# Patient Record
Sex: Female | Born: 1955
Health system: Southern US, Community
[De-identification: ages and names within clinical notes are randomized; demographics above are authoritative.]

## PROBLEM LIST (undated history)

## (undated) DIAGNOSIS — C801 Malignant (primary) neoplasm, unspecified: Secondary | ICD-10-CM

## (undated) DIAGNOSIS — D649 Anemia, unspecified: Secondary | ICD-10-CM

## (undated) DIAGNOSIS — Z889 Allergy status to unspecified drugs, medicaments and biological substances status: Secondary | ICD-10-CM

## (undated) DIAGNOSIS — E119 Type 2 diabetes mellitus without complications: Secondary | ICD-10-CM

## (undated) DIAGNOSIS — D219 Benign neoplasm of connective and other soft tissue, unspecified: Secondary | ICD-10-CM

## (undated) DIAGNOSIS — M359 Systemic involvement of connective tissue, unspecified: Secondary | ICD-10-CM

## (undated) DIAGNOSIS — I10 Essential (primary) hypertension: Secondary | ICD-10-CM

## (undated) DIAGNOSIS — E079 Disorder of thyroid, unspecified: Secondary | ICD-10-CM

## (undated) DIAGNOSIS — L80 Vitiligo: Secondary | ICD-10-CM

## (undated) DIAGNOSIS — L309 Dermatitis, unspecified: Secondary | ICD-10-CM

## (undated) DIAGNOSIS — B009 Herpesviral infection, unspecified: Secondary | ICD-10-CM

## (undated) DIAGNOSIS — K219 Gastro-esophageal reflux disease without esophagitis: Secondary | ICD-10-CM

## (undated) HISTORY — DX: Herpesviral infection, unspecified: B00.9

## (undated) HISTORY — DX: Benign neoplasm of connective and other soft tissue, unspecified: D21.9

## (undated) HISTORY — DX: Dermatitis, unspecified: L30.9

## (undated) HISTORY — DX: Type 2 diabetes mellitus without complications: E11.9

## (undated) HISTORY — DX: Disorder of thyroid, unspecified: E07.9

## (undated) HISTORY — PX: ABDOMINAL HYSTERECTOMY: SHX81

## (undated) HISTORY — DX: Vitiligo: L80

---

## 2001-03-18 ENCOUNTER — Other Ambulatory Visit: Admission: RE | Admit: 2001-03-18 | Discharge: 2001-03-18 | Payer: Self-pay | Admitting: Obstetrics and Gynecology

## 2001-03-18 ENCOUNTER — Ambulatory Visit (HOSPITAL_COMMUNITY): Admission: RE | Admit: 2001-03-18 | Discharge: 2001-03-18 | Payer: Self-pay | Admitting: Pediatrics

## 2001-03-18 ENCOUNTER — Encounter: Payer: Self-pay | Admitting: General Surgery

## 2002-01-17 ENCOUNTER — Encounter: Payer: Self-pay | Admitting: Emergency Medicine

## 2002-01-17 ENCOUNTER — Emergency Department (HOSPITAL_COMMUNITY): Admission: EM | Admit: 2002-01-17 | Discharge: 2002-01-17 | Payer: Self-pay | Admitting: Emergency Medicine

## 2002-03-25 ENCOUNTER — Encounter: Payer: Self-pay | Admitting: General Surgery

## 2002-03-25 ENCOUNTER — Ambulatory Visit (HOSPITAL_COMMUNITY): Admission: RE | Admit: 2002-03-25 | Discharge: 2002-03-25 | Payer: Self-pay | Admitting: General Surgery

## 2002-07-07 ENCOUNTER — Encounter: Payer: Self-pay | Admitting: Family Medicine

## 2002-07-07 ENCOUNTER — Ambulatory Visit (HOSPITAL_COMMUNITY): Admission: RE | Admit: 2002-07-07 | Discharge: 2002-07-07 | Payer: Self-pay | Admitting: Family Medicine

## 2003-04-06 ENCOUNTER — Ambulatory Visit (HOSPITAL_COMMUNITY): Admission: RE | Admit: 2003-04-06 | Discharge: 2003-04-06 | Payer: Self-pay | Admitting: Family Medicine

## 2003-04-06 ENCOUNTER — Encounter: Payer: Self-pay | Admitting: Family Medicine

## 2004-04-28 ENCOUNTER — Ambulatory Visit (HOSPITAL_COMMUNITY): Admission: RE | Admit: 2004-04-28 | Discharge: 2004-04-28 | Payer: Self-pay | Admitting: Family Medicine

## 2004-08-13 ENCOUNTER — Emergency Department (HOSPITAL_COMMUNITY): Admission: EM | Admit: 2004-08-13 | Discharge: 2004-08-13 | Payer: Self-pay | Admitting: Emergency Medicine

## 2005-05-01 ENCOUNTER — Ambulatory Visit (HOSPITAL_COMMUNITY): Admission: RE | Admit: 2005-05-01 | Discharge: 2005-05-01 | Payer: Self-pay | Admitting: Family Medicine

## 2006-05-03 ENCOUNTER — Ambulatory Visit (HOSPITAL_COMMUNITY): Admission: RE | Admit: 2006-05-03 | Discharge: 2006-05-03 | Payer: Self-pay | Admitting: Family Medicine

## 2006-12-16 ENCOUNTER — Ambulatory Visit (HOSPITAL_COMMUNITY): Admission: RE | Admit: 2006-12-16 | Discharge: 2006-12-16 | Payer: Self-pay | Admitting: Family Medicine

## 2007-02-03 ENCOUNTER — Ambulatory Visit: Payer: Self-pay | Admitting: Orthopedic Surgery

## 2007-03-04 ENCOUNTER — Ambulatory Visit: Payer: Self-pay | Admitting: Orthopedic Surgery

## 2007-05-05 ENCOUNTER — Ambulatory Visit (HOSPITAL_COMMUNITY): Admission: RE | Admit: 2007-05-05 | Discharge: 2007-05-05 | Payer: Self-pay | Admitting: Family Medicine

## 2007-06-20 ENCOUNTER — Encounter: Payer: Self-pay | Admitting: Orthopedic Surgery

## 2007-06-23 ENCOUNTER — Ambulatory Visit: Payer: Self-pay | Admitting: Orthopedic Surgery

## 2007-06-23 DIAGNOSIS — M25569 Pain in unspecified knee: Secondary | ICD-10-CM

## 2007-06-23 DIAGNOSIS — M23302 Other meniscus derangements, unspecified lateral meniscus, unspecified knee: Secondary | ICD-10-CM

## 2007-06-23 DIAGNOSIS — M1710 Unilateral primary osteoarthritis, unspecified knee: Secondary | ICD-10-CM

## 2008-05-05 ENCOUNTER — Ambulatory Visit (HOSPITAL_COMMUNITY): Admission: RE | Admit: 2008-05-05 | Discharge: 2008-05-05 | Payer: Self-pay | Admitting: Family Medicine

## 2009-05-09 ENCOUNTER — Ambulatory Visit (HOSPITAL_COMMUNITY): Admission: RE | Admit: 2009-05-09 | Discharge: 2009-05-09 | Payer: Self-pay | Admitting: Family Medicine

## 2010-05-11 ENCOUNTER — Ambulatory Visit (HOSPITAL_COMMUNITY): Admission: RE | Admit: 2010-05-11 | Discharge: 2010-05-11 | Payer: Self-pay | Admitting: Family Medicine

## 2010-12-01 NOTE — Op Note (Signed)
Sophia Wilson, Sophia Wilson              ACCOUNT NO.:  [id number]   MEDICAL RECORD NO.:  [id number]          PATIENT TYPE:  EMS   LOCATION:  ED                            FACILITY:  [facility]   PHYSICIAN:  Carren Rang, M.D.    DATE OF BIRTH:  1955/09/13   DATE OF PROCEDURE:  08/14/2003  DATE OF DISCHARGE:  08/13/2004                                 OPERATIVE REPORT   OPERATION/PROCEDURE:  Right thumb nail bed repair.   INDICATIONS:  Acute open fracture with nail bed laceration, right thumb,  sustained when thumb was cut by dirty fan at work.  X-ray shows distal tuft  fracture.   DESCRIPTION OF PROCEDURE:  After Betadine, alcohol prep, digital block was  achieved with a 0.5% Marcaine without epinephrine injection.  Good  anesthesia was achieved.  The wound was again cleaned with Betadine and nail  plate was removed in two major fragments with a Therapist, nutritional. The nail  plate was then debrided and soaked in Betadine and the nail bed was cleaned  copiously with Betadine and saline irrigation.  A small, approximately 3 x 3  mm flap laceration of the distal nail bed was noted and found to be ischemic  and dirty in appearance and was debrided away.  The wound extended under the  tip of the distal tuft and was irrigated copiously with normal saline.  No  persistent foreign bodies were identified.  The nail plates were then again  cleaned well and debrided.  Because of the fracture across the nail plate at  the nail bed laceration, the nail plate was rotated 180 degrees and the  distal portion of the nail plate was placed under the eponychium and the  proximal portion of the nail plates over the tip of the nail bed.  The nail  plates were pierced with an 18-gauge needle so that suture could be placed.  The nail plates were sutured in placed over the nail bed providing a  template for healing of the nail bed and to provide separation from  eponychium and the nail bed.   DIAGNOSIS:  Acute open  fracture of the right thumb distal phalanx with no  bed laceration and nail plate repair.   PLAN:  The patient was given prescription for Keflex 500 mg for five days  and Vicodin one to two q.4-6h. p.r.n. pain not relieved by Aleve.  She was  also given DT in the ED.  She is to follow up with Dr. Hilda Lias in two days.  Contact has already been made with Dr. Hilda Lias.      VC/MEDQ  D:  08/13/2004  T:  08/14/2004  Job:  [id number]

## 2011-04-02 ENCOUNTER — Other Ambulatory Visit (HOSPITAL_COMMUNITY): Payer: Self-pay | Admitting: Family Medicine

## 2011-04-02 DIAGNOSIS — Z1239 Encounter for other screening for malignant neoplasm of breast: Secondary | ICD-10-CM

## 2011-05-07 ENCOUNTER — Ambulatory Visit (HOSPITAL_COMMUNITY): Payer: Self-pay

## 2011-05-14 ENCOUNTER — Ambulatory Visit (HOSPITAL_COMMUNITY)
Admission: RE | Admit: 2011-05-14 | Discharge: 2011-05-14 | Disposition: A | Discharge status: Home / Self Care | Payer: 59 | Source: Ambulatory Visit | Attending: Family Medicine | Admitting: Family Medicine

## 2011-05-14 DIAGNOSIS — Z1239 Encounter for other screening for malignant neoplasm of breast: Secondary | ICD-10-CM

## 2011-05-14 DIAGNOSIS — Z1231 Encounter for screening mammogram for malignant neoplasm of breast: Secondary | ICD-10-CM | POA: Insufficient documentation

## 2011-05-19 ENCOUNTER — Emergency Department (HOSPITAL_COMMUNITY)
Admission: EM | Admit: 2011-05-19 | Discharge: 2011-05-19 | Disposition: A | Discharge status: Home / Self Care | Payer: 59 | Attending: Emergency Medicine | Admitting: Emergency Medicine

## 2011-05-19 ENCOUNTER — Emergency Department (HOSPITAL_COMMUNITY): Payer: 59

## 2011-05-19 DIAGNOSIS — J069 Acute upper respiratory infection, unspecified: Secondary | ICD-10-CM | POA: Insufficient documentation

## 2011-05-19 HISTORY — DX: Essential (primary) hypertension: I10

## 2011-05-19 NOTE — ED Notes (Signed)
Pt lying in bed resting

## 2011-05-19 NOTE — ED Notes (Signed)
Pt presents with sore throat, headache, and nausea since Wednesday. NAD at this time.

## 2011-05-19 NOTE — ED Provider Notes (Signed)
History     CSN: [id number] Arrival date & time: 05/19/2011  5:31 PM   Chief Complaint  Patient presents with  . Headache  . Sore Throat  . Nausea    HPI Pt was seen at 1835.  Per pt, c/o gradual onset and persistence of constant runny/stuffy nose, cough, sore throat, sinus and ears congestion x3 days.  +multiple others in household with same symptoms.  Denies CP/palpitations, no SOB, no abd pain, no fevers, no N/V/D, no rash.     Past Medical History  Diagnosis Date  . Hypertension     Past Surgical History  Procedure Date  . Abdominal hysterectomy     Social History  . Marital Status: Married   Social History Main Topics  . Smoking status: Never Smoker   . Smokeless tobacco: None  . Alcohol Use: No  . Drug Use: No    Review of Systems ROS: Statement: All systems negative except as marked or noted in the HPI; Constitutional: Negative for fever and chills. ; ; Eyes: Negative for eye pain, redness and discharge. ; ; ENMT: Positive for ear pain, hoarseness, nasal congestion, sinus pressure and sore throat. ; ; Cardiovascular: Negative for chest pain, palpitations, diaphoresis, dyspnea and peripheral edema. ; ; Respiratory: +cough.  Negative for wheezing and stridor. ; ; Gastrointestinal: Negative for nausea, vomiting, diarrhea and abdominal pain, blood in stool, hematemesis, jaundice and rectal bleeding. . ; ; Genitourinary: Negative for dysuria, flank pain and hematuria. ; ; Musculoskeletal: Negative for back pain and neck pain. Negative for swelling and trauma.; ; Skin: Negative for pruritus, rash, abrasions, blisters, bruising and skin lesion.; ; Neuro: Negative for headache, lightheadedness and neck stiffness. Negative for weakness, altered level of consciousness , altered mental status, extremity weakness, paresthesias, involuntary movement, seizure and syncope.    Allergies  Sulfur  Home Medications   Current Outpatient Rx  Name Route Sig Dispense Refill  . MELOXICAM  15 MG PO TABS Oral Take 15 mg by mouth daily.      Marland Kitchen OLMESARTAN-AMLODIPINE-HCTZ 40-5-25 MG PO TABS Oral Take 1 tablet by mouth daily.        BP 125/62  Pulse 84  Temp(Src) 100.1 F (37.8 C) (Oral)  Resp 18  SpO2 100%  Physical Exam 1840: Physical examination:  Nursing notes reviewed; Vital signs and O2 SAT reviewed;  Constitutional: Well developed, Well nourished, Well hydrated, In no acute distress; Head:  Normocephalic, atraumatic; Eyes: EOMI, PERRL, No scleral icterus; ENMT: TM's clear bilat, +edemetous nasal turbinates bilat with clear rhinorrhea.  Mouth and pharynx normal, Mucous membranes moist; Neck: Supple, Full range of motion, No lymphadenopathy; Cardiovascular: Regular rate and rhythm, No murmur, rub, or gallop; Respiratory: Breath sounds clear & equal bilaterally, No rales, rhonchi, wheezes, or rub, Normal respiratory effort/excursion; Chest: Nontender, Movement normal; Abdomen: Soft, Nontender, Nondistended, Normal bowel sounds; Extremities: Pulses normal, No tenderness, No edema, No calf edema or asymmetry.; Neuro: AA&Ox3, Major CN grossly intact.  No gross focal motor or sensory deficits in extremities.; Skin: Color normal, Warm, Dry, no rash.   ED Course  Procedures  MDM  MDM Reviewed: nursing note and vitals Interpretation: labs and x-ray   Results for orders placed during the hospital encounter of 05/19/11  RAPID STREP SCREEN      Component Value Range   Streptococcus, Group A Screen (Direct) NEGATIVE  NEGATIVE    Dg Chest 2 View  05/19/2011  *RADIOLOGY REPORT*  Clinical Data: Cough.  Short of breath.  Congestion.  CHEST - 2 VIEW  Comparison: None  Findings: Heart size is normal.  Mediastinal shadows are normal. The lungs are clear.  No effusions.  Ordinary mild degenerative changes effect the spine.  IMPRESSION: No active disease  Original Report Authenticated By: Thomasenia Sales, M.D.   8:20 PM:  Appears URI, will tx symptomatically for now.  Pt wants to go home  now.  Dx testing d/w pt.  Questions answered.  Verb understanding, agreeable to d/c home with outpt f/u.      Kalimah Capurro Allison Quarry, DO 05/21/11 1247

## 2011-05-19 NOTE — ED Notes (Signed)
Pt taken to xray 

## 2012-02-13 ENCOUNTER — Other Ambulatory Visit: Payer: Self-pay

## 2012-02-13 ENCOUNTER — Telehealth: Payer: Self-pay

## 2012-02-13 DIAGNOSIS — Z139 Encounter for screening, unspecified: Secondary | ICD-10-CM

## 2012-02-13 NOTE — Telephone Encounter (Signed)
Gastroenterology Pre-Procedure Form   Request Date: 02/13/2012      Requesting Physician: Dr. Phillips Odor     PATIENT INFORMATION:  Sophia Wilson is a 56 y.o., female (DOB=30-Sep-1955).  PROCEDURE: Procedure(s) requested: colonoscopy Procedure Reason: screening for colon cancer  PATIENT REVIEW QUESTIONS: The patient reports the following:   1. Diabetes Melitis: no 2. Joint replacements in the past 12 months: no 3. Major health problems in the past 3 months: no 4. Has an artificial valve or MVP:no 5. Has been advised in past to take antibiotics in advance of a procedure like teeth cleaning: no}    MEDICATIONS & ALLERGIES:    Patient reports the following regarding taking any blood thinners:   Plavix? no Aspirin?no Coumadin?  no  Patient confirms/reports the following medications:  Current Outpatient Prescriptions  Medication Sig Dispense Refill  . fluticasone (VERAMYST) 27.5 MCG/SPRAY nasal spray Place 2 sprays into the nose daily. As needed      . loratadine (CLARITIN) 10 MG tablet Take 10 mg by mouth daily. As needed      . meloxicam (MOBIC) 15 MG tablet Take 15 mg by mouth daily.        . Olmesartan-Amlodipine-HCTZ (TRIBENZOR) 40-5-25 MG TABS Take 1 tablet by mouth daily.          Patient confirms/reports the following allergies:  Allergies  Allergen Reactions  . Sulfur Hives    Patient is appropriate to schedule for requested procedure(s): yes  AUTHORIZATION INFORMATION Primary Insurance:   ID #:  Group #:  Pre-Cert / Auth required:    Secondary Insurance:   ID #:   Group #:  Pre-Cert / Auth required: Pre-Cert / Auth #:   No orders of the defined types were placed in this encounter.    SCHEDULE INFORMATION: Procedure has been scheduled as follows:  Date: 03/10/2012    Time: 10:30 AM  Location: [facility] Short Stay  This Gastroenterology Pre-Precedure Form is being routed to the following provider(s) for review: Jonette Eva, MD

## 2012-02-13 NOTE — Telephone Encounter (Signed)
MOVI PREP SPLIT DOSING, REGULAR BREAKFAST. CLEAR LIQUIDS AFTER 9 AM.  

## 2012-02-13 NOTE — Telephone Encounter (Signed)
Rx and instructions mailed to pt.  

## 2012-02-13 NOTE — Telephone Encounter (Signed)
Called pt VM not set up yet.

## 2012-02-25 ENCOUNTER — Encounter (HOSPITAL_COMMUNITY): Payer: Self-pay | Admitting: Pharmacy Technician

## 2012-03-10 ENCOUNTER — Ambulatory Visit (HOSPITAL_COMMUNITY)
Admission: RE | Admit: 2012-03-10 | Discharge: 2012-03-10 | Disposition: A | Discharge status: Home / Self Care | Payer: 59 | Source: Ambulatory Visit | Attending: Gastroenterology | Admitting: Gastroenterology

## 2012-03-10 ENCOUNTER — Encounter (HOSPITAL_COMMUNITY)
Admission: RE | Disposition: A | Discharge status: Home / Self Care | Payer: Self-pay | Source: Ambulatory Visit | Attending: Gastroenterology

## 2012-03-10 ENCOUNTER — Encounter (HOSPITAL_COMMUNITY): Payer: Self-pay | Admitting: *Deleted

## 2012-03-10 DIAGNOSIS — K644 Residual hemorrhoidal skin tags: Secondary | ICD-10-CM | POA: Insufficient documentation

## 2012-03-10 DIAGNOSIS — I10 Essential (primary) hypertension: Secondary | ICD-10-CM | POA: Insufficient documentation

## 2012-03-10 DIAGNOSIS — Z1211 Encounter for screening for malignant neoplasm of colon: Secondary | ICD-10-CM | POA: Insufficient documentation

## 2012-03-10 DIAGNOSIS — Z139 Encounter for screening, unspecified: Secondary | ICD-10-CM

## 2012-03-10 HISTORY — PX: COLONOSCOPY: SHX5424

## 2012-03-10 HISTORY — DX: Anemia, unspecified: D64.9

## 2012-03-10 HISTORY — DX: Allergy status to unspecified drugs, medicaments and biological substances: Z88.9

## 2012-03-10 SURGERY — COLONOSCOPY
Anesthesia: Moderate Sedation

## 2012-03-10 MED ORDER — MIDAZOLAM HCL 5 MG/5ML IJ SOLN
INTRAMUSCULAR | Status: DC | PRN
  Administered 2012-03-10 (×3): 2 mg via INTRAVENOUS

## 2012-03-10 MED ORDER — SODIUM CHLORIDE 0.45 % IV SOLN
Freq: Once | INTRAVENOUS | Status: AC
  Administered 2012-03-10: 10:00:00 via INTRAVENOUS

## 2012-03-10 MED ORDER — STERILE WATER FOR IRRIGATION IR SOLN
Status: DC | PRN
  Administered 2012-03-10: 10:00:00

## 2012-03-10 MED ORDER — MEPERIDINE HCL 100 MG/ML IJ SOLN
INTRAMUSCULAR | Status: AC
  Filled 2012-03-10: qty 2

## 2012-03-10 MED ORDER — MEPERIDINE HCL 100 MG/ML IJ SOLN
INTRAMUSCULAR | Status: DC | PRN
  Administered 2012-03-10 (×3): 25 mg via INTRAVENOUS

## 2012-03-10 MED ORDER — MIDAZOLAM HCL 5 MG/5ML IJ SOLN
INTRAMUSCULAR | Status: AC
  Filled 2012-03-10: qty 10

## 2012-03-10 NOTE — Op Note (Signed)
[facility] [address] [place], [address]   COLONOSCOPY PROCEDURE REPORT  PATIENT: Sophia Wilson, Sophia Wilson  MR#: [id number] BIRTHDATE: April 08, 1956 , 56  yrs. old GENDER: Female ENDOSCOPIST: Jonette Eva, MD REFERRED YQ:[id number] Phillips Odor, M.D. PROCEDURE DATE:  03/10/2012 PROCEDURE:   Colonoscopy, screening INDICATIONS:SCREENING. MEDICATIONS: Demerol 75 and Versed 6  DESCRIPTION OF PROCEDURE:    Physical exam was performed.  Informed consent was obtained from the patient after explaining the benefits, risks, and alternatives to procedure.  The patient was connected to monitor and placed in left lateral position. Continuous oxygen was provided by nasal cannula and IV medicine administered through an indwelling cannula.  After administration of sedation and rectal exam, the patients rectum was intubated and the EC-3890Li ([id number])  colonoscope was advanced under direct visualization to the cecum.  The scope was removed slowly by carefully examining the color, texture, anatomy, and integrity mucosa on the way out.  The patient was recovered in endoscopy and discharged home in satisfactory condition.       COLON FINDINGS: A normal appearing cecum, ileocecal valve, and appendiceal orifice were identified.  The ascending, hepatic flexure, transverse, splenic flexure, descending, sigmoid colon and rectum appeared unremarkable.  No polyps, DIVERTICULOSIS, or cancers were seen and Moderate sized internal hemorrhoids were found.  PREP QUALITY: excellent. CECAL W/D TIME: 11 minutes  COMPLICATIONS: None  ENDOSCOPIC IMPRESSION: 1.   Normal colon 2.   Moderate sized internal hemorrhoids   RECOMMENDATIONS: 1.  High fiber diet 2.  repeat Colonscopy in 10 years.    _______________________________ Rosalie DoctorJonette Eva, MD 03/10/2012 10:56 AM

## 2012-03-10 NOTE — H&P (Signed)
  Primary Care Physician:  Colette Ribas, MD Primary Gastroenterologist:  Dr. Darrick Penna  Pre-Procedure History & Physical: HPI:  Sophia Wilson is a 56 y.o. female here for COLON CANCER SCREENING.   Past Medical History  Diagnosis Date  . Hypertension   . H/O seasonal allergies   . Anemia     Past Surgical History  Procedure Date  . Abdominal hysterectomy     Prior to Admission medications   Medication Sig Start Date End Date Taking? Authorizing Provider  loratadine (CLARITIN) 10 MG tablet Take 10 mg by mouth daily as needed. For allergies   Yes Historical Provider, MD  meloxicam (MOBIC) 15 MG tablet Take 15 mg by mouth daily.     Yes Historical Provider, MD  Olmesartan-Amlodipine-HCTZ (TRIBENZOR) 40-5-25 MG TABS Take 1 tablet by mouth daily.     Yes Historical Provider, MD  fluticasone (FLONASE) 50 MCG/ACT nasal spray Place 2 sprays into the nose daily as needed. For allergies    Historical Provider, MD    Allergies as of 02/13/2012 - Review Complete 02/13/2012  Allergen Reaction Noted  . Sulfur Hives     Family History  Problem Relation Age of Onset  . Colon cancer Neg Hx     History   Social History  . Marital Status: Married    Spouse Name: N/A    Number of Children: N/A  . Years of Education: N/A   Occupational History  . Not on file.   Social History Main Topics  . Smoking status: Never Smoker   . Smokeless tobacco: Not on file  . Alcohol Use: No  . Drug Use: No  . Sexually Active:    Other Topics Concern  . Not on file   Social History Narrative  . No narrative on file    Review of Systems: See HPI, otherwise negative ROS   Physical Exam: BP 154/89  Pulse 76  Temp 98.5 F (36.9 C) (Oral)  Resp 21  Ht 5' 7.5" (1.715 m)  Wt 234 lb (106.142 kg)  BMI 36.11 kg/m2  SpO2 100% General:   Alert,  pleasant and cooperative in NAD Head:  Normocephalic and atraumatic. Neck:  Supple;  Lungs:  Clear throughout to auscultation.    Heart:   Regular rate and rhythm. Abdomen:  Soft, nontender and nondistended. Normal bowel sounds, without guarding, and without rebound.   Neurologic:  Alert and  oriented x4;  grossly normal neurologically.  Impression/Plan:     SCREENING  Plan:  1. TCS TODAY

## 2012-03-13 ENCOUNTER — Encounter (HOSPITAL_COMMUNITY): Payer: Self-pay | Admitting: Gastroenterology

## 2012-04-14 ENCOUNTER — Other Ambulatory Visit (HOSPITAL_COMMUNITY): Payer: Self-pay | Admitting: Family Medicine

## 2012-04-14 DIAGNOSIS — Z139 Encounter for screening, unspecified: Secondary | ICD-10-CM

## 2012-05-15 ENCOUNTER — Ambulatory Visit (HOSPITAL_COMMUNITY)
Admission: RE | Admit: 2012-05-15 | Discharge: 2012-05-15 | Disposition: A | Discharge status: Home / Self Care | Payer: 59 | Source: Ambulatory Visit | Attending: Family Medicine | Admitting: Family Medicine

## 2012-05-15 DIAGNOSIS — Z139 Encounter for screening, unspecified: Secondary | ICD-10-CM

## 2012-05-15 DIAGNOSIS — Z1231 Encounter for screening mammogram for malignant neoplasm of breast: Secondary | ICD-10-CM | POA: Insufficient documentation

## 2012-06-19 ENCOUNTER — Other Ambulatory Visit (HOSPITAL_COMMUNITY): Payer: Self-pay | Admitting: Family Medicine

## 2012-06-19 DIAGNOSIS — R220 Localized swelling, mass and lump, head: Secondary | ICD-10-CM

## 2012-06-20 ENCOUNTER — Ambulatory Visit (HOSPITAL_COMMUNITY)
Admission: RE | Admit: 2012-06-20 | Discharge: 2012-06-20 | Disposition: A | Discharge status: Home / Self Care | Payer: 59 | Source: Ambulatory Visit | Attending: Family Medicine | Admitting: Family Medicine

## 2012-06-20 DIAGNOSIS — R220 Localized swelling, mass and lump, head: Secondary | ICD-10-CM | POA: Insufficient documentation

## 2012-06-20 DIAGNOSIS — R221 Localized swelling, mass and lump, neck: Secondary | ICD-10-CM

## 2012-07-30 ENCOUNTER — Other Ambulatory Visit (HOSPITAL_COMMUNITY): Payer: Self-pay | Admitting: "Endocrinology

## 2012-07-30 DIAGNOSIS — E042 Nontoxic multinodular goiter: Secondary | ICD-10-CM

## 2012-08-01 DIAGNOSIS — E042 Nontoxic multinodular goiter: Secondary | ICD-10-CM | POA: Insufficient documentation

## 2012-08-05 ENCOUNTER — Other Ambulatory Visit (HOSPITAL_COMMUNITY): Payer: Self-pay | Admitting: "Endocrinology

## 2012-08-05 ENCOUNTER — Ambulatory Visit (HOSPITAL_COMMUNITY)
Admission: RE | Admit: 2012-08-05 | Discharge: 2012-08-05 | Disposition: A | Discharge status: Home / Self Care | Payer: 59 | Source: Ambulatory Visit | Attending: "Endocrinology | Admitting: "Endocrinology

## 2012-08-05 ENCOUNTER — Other Ambulatory Visit (HOSPITAL_COMMUNITY): Payer: 59

## 2012-08-05 DIAGNOSIS — E042 Nontoxic multinodular goiter: Secondary | ICD-10-CM

## 2012-08-05 DIAGNOSIS — C73 Malignant neoplasm of thyroid gland: Secondary | ICD-10-CM | POA: Insufficient documentation

## 2012-08-14 ENCOUNTER — Encounter (HOSPITAL_COMMUNITY): Payer: Self-pay | Admitting: Pharmacy Technician

## 2012-08-14 NOTE — H&P (Signed)
  NTS SOAP Note  Vital Signs:  Vitals as of: 08/14/2012: Systolic 163: Diastolic 94: Heart Rate 71: Temp 98.84F: Height 17ft 7in: Weight 234Lbs 0 Ounces: BMI 37  BMI : 36.65 kg/m2  Subjective: This 56 Years 51 Months old Female presents for thyroid cancer.  Found on biopsy of a thyroid isthmus nodule.  Denies any voice changes.  Did feel a lump on her wind pipe.  No breathing issues.  No heat intolerance, heart palpitations, weight changes.  No family h/o thyroid cancer.  No h/o irradiation to the neck.  Review of Symptoms:  Constitutional:unremarkable   Head:unremarkable    Eyes:unremarkable   sinus Cardiovascular:  unremarkable   Respiratory:unremarkable   Gastrointestinal:  unremarkable   Genitourinary:unremarkable       joint and back pain Skin:unremarkable Hematolgic/Lymphatic:unremarkable     Allergic/Immunologic:unremarkable     Past Medical History:    Reviewed   Past Medical History  Surgical History: hysterectomy Medical Problems: HTN Allergies: sulfur Medications: tribenxor, leoxicam, fluticasone, loratadine   Social History:Reviewed  Social History  Preferred Language: English Race:  Black or African American Ethnicity: Not Hispanic / Latino Age: 33 Years 0 Months Marital Status:  M Alcohol:  No Recreational drug(s):  No   Smoking Status: Never smoker reviewed on 08/14/2012 Functional Status reviewed on mm/dd/yyyy ------------------------------------------------ Bathing: Normal Cooking: Normal Dressing: Normal Driving: Normal Eating: Normal Managing Meds: Normal Oral Care: Normal Shopping: Normal Toileting: Normal Transferring: Normal Walking: Normal Cognitive Status reviewed on mm/dd/yyyy ------------------------------------------------ Attention: Normal Decision Making: Normal Language: Normal Memory: Normal Motor: Normal Perception: Normal Problem Solving: Normal Visual and Spatial:  Normal   Family History:  Reviewed   Family History              Mother:  Cancer-lung    Objective Information: General:  Well appearing, well nourished in no distress. Eyes:unremarkable   Throat:  no erythema, exudates or lesions.   Palpable nodule in isthmus.  No lymphadenopathy.  Normal size gland. Heart:  RRR, no murmur Lungs:    CTA bilaterally, no wheezes, rhonchi, rales.  Breathing unlabored.  Assessment:Thyroid carcinoma  Diagnosis &amp; Procedure:    Plan:Scheduled for total thyroidectomy on 08/20/12.   Patient Education:Alternative treatments to surgery were discussed with patient (and family).  Risks and benefits  of procedure including bleeding, infection, and voice changes were fully explained to the patient (and family) who gave informed consent. Patient/family questions were addressed.  Follow-up:Pending Surgery

## 2012-08-14 NOTE — Patient Instructions (Addendum)
Sophia Wilson  08/14/2012   Your procedure is scheduled on:  08/20/12  Report to [facility] [place] [facility].  Call this number if you have problems the morning of surgery: [phone]   Remember:   Do not eat food or drink liquids after midnight.   Take these medicines the morning of surgery with A SIP OF WATER: tribenzor, flonase, clairtin   Do not wear jewelry, make-up or nail polish.  Do not wear lotions, powders, or perfumes. You may wear deodorant.  Do not shave 48 hours prior to surgery. Men may shave face and neck.  Do not bring valuables to the hospital.  Contacts, dentures or bridgework may not be worn into surgery.  Leave suitcase in the car. After surgery it may be brought to your room.  For patients admitted to the hospital, checkout time is 11:00 AM the day of  discharge.   Patients discharged the day of surgery will not be allowed to drive  home.  Name and phone number of your driver: family  Special Instructions: Shower using CHG 2 nights before surgery and the night before surgery.  If you shower the day of surgery use CHG.  Use special wash - you have one bottle of CHG for all showers.  You should use approximately 1/3 of the bottle for each shower.   Please read over the following fact sheets that you were given: Pain Booklet, Surgical Site Infection Prevention, Anesthesia Post-op Instructions and Care and Recovery After Surgery   PATIENT INSTRUCTIONS POST-ANESTHESIA  IMMEDIATELY FOLLOWING SURGERY:  Do not drive or operate machinery for the first twenty four hours after surgery.  Do not make any important decisions for twenty four hours after surgery or while taking narcotic pain medications or sedatives.  If you develop intractable nausea and vomiting or a severe headache please notify your doctor immediately.  FOLLOW-UP:  Please make an appointment with your surgeon as instructed. You do not need to follow up with anesthesia unless specifically instructed to do  so.  WOUND CARE INSTRUCTIONS (if applicable):  Keep a dry clean dressing on the anesthesia/puncture wound site if there is drainage.  Once the wound has quit draining you may leave it open to air.  Generally you should leave the bandage intact for twenty four hours unless there is drainage.  If the epidural site drains for more than 36-48 hours please call the anesthesia department.  QUESTIONS?:  Please feel free to call your physician or the hospital operator if you have any questions, and they will be happy to assist you.      Thyroidectomy Thyroidectomy is the removal of part or all of your thyroid gland. Your thyroid gland is a butterfly-shaped gland at the base of your neck. It produces a substance called thyroid hormone, which regulates the physical and chemical processes that keep your body functioning and make energy available to your body (metabolism). The amount of thyroid gland tissue that is removed during a thyroidectomy depends on the reason for the procedure. Typically, if only a part of your gland is removed, enough thyroid gland tissue remains to maintain normal function. If your entire thyroid gland is removed or if the amount of thyroid gland tissue remaining is inadequate to maintain normal function, you will need life-long treatment with thyroid hormone on a daily basis. Thyroidectomy maybe performed when you have the following conditions:  Thyroid nodules. These are small, abnormal collections of tissue that form inside the thyroid gland. If these nodules  begin to enlarge at a rapid rate, a sample of tissue from the nodule is taken through a needle and examined (needle biopsy). This is done to determine if the nodules are cancerous. Depending on the outcome of this exam, thyroidectomy may be necessary.  Thyroid cancer.  Goiter, which is an enlarged thyroid gland. All or part of the thyroid gland may be removed if the gland has become so large that it causes difficulty breathing  or swallowing.  Hyperthyroidism. This is when the thyroid gland produces too much thyroid hormone. Hypothyroidism can cause symptoms of fluctuating weight, intolerance to heat, irritability, shortness of breath, and chest pain. LET YOUR CAREGIVER KNOW ABOUT:   Allergies to food or medicine.  Medicines that you are taking, including vitamins, herbs, eyedrops, over-the-counter medicines, and creams.  Previous problems you have had with anesthetics or numbing medicines.  History of bleeding problems or blood clots.  Previous surgeries you have had.  Other health problems, including diabetes and kidney problems, you have had.  Possibility of pregnancy, if this applies. BEFORE THE PROCEDURE   Do not eat or drink anything, including water, for at least 6 hours before the procedure.  Ask your caregiver whether you should stop taking certain medicines before the day of the procedure. PROCEDURE  There are different ways that thyroidectomy is performed. For each type, you will be given a medicine to make you sleep (general anesthetic). The three main types of thyroidectomy are listed as follows:  Conventional thyroidectomy A cut (incision) in the center portion of your lower neck is made with a scalpel. Muscles below your skin are separated to gain access to your thyroid gland. Your thyroid gland is dissected from your windpipe (trachea). Often a drain is placed at the incision site to drain any blood that accumulates under the skin after the procedure. This drain will be removed before you go home. The wound from the incision should heal within 2 weeks.  Endoscopic thyroidectomy Small incisions are made in your lower neck. A small instrument (endoscope) is inserted under your skin at the incision sites. The endoscope used for thyroidectomy consists of 2 flexible tubes. Inside one of the tubes is a video camera that is used to guide the Careers adviser. Tools to remove the thyroid gland, including a tool  to cut the gland (dissectors) and a suction device, are inserted through the other tube. The surgeon uses the dissectors to dissect the thyroid gland from the trachea and remove it.  Robotic thyroidectomy This procedure allows your thyroid gland to be removed through incisions in your armpit, your chest, or high in your neck. Instruments similar to endoscopes provide a 3-dimensional picture of the surgical site. Dissecting instruments are controlled by devices similar to joysticks. These devices allow more accurate manipulation of the instruments. After the blood supply to the gland is removed, the gland is cut into several pieces and removed through the incisions. RISKS AND COMPLICATIONS Complications associated with thyroidectomy are rare, but they can occur. Possible complications include:  A decrease in parathyroid hormone levels (hypoparathyroidism) Your parathyroid glands are located close behind your thyroid gland. They are responsible for maintaining calcium levels inthe body. If they are damaged or removed, levels of calcium in the blood become low and nerves become irritable, which can cause muscle spasms. Medicines are available to treat this.  Bacterial infection This can often be treated with medicines that kill bacteria (antibiotics).  Damage to your voice box nerves This could cause hoarseness or complete loss  of voice.  Bleeding or airway obstruction. AFTER THE PROCEDURE   You will rest in the recovery room as you wake up.  When you first wake up, your throat may feel slightly sore.  You will not be allowed to eat or drink until instructed otherwise.  You will be taken to your hospital room. You will usually stay at the hospital for 1 or 2 nights.  If a drain is placed during the procedure, it usually is removed the next day.  You may have some mild neck pain.  Your voice may be weak. This usually is temporary. Document Released: 12/26/2000 Document Revised: 09/24/2011  Document Reviewed: 10/04/2010 [facility] Patient Information 2013 [place], [place].

## 2012-08-15 ENCOUNTER — Encounter (HOSPITAL_COMMUNITY): Payer: Self-pay

## 2012-08-15 ENCOUNTER — Encounter (HOSPITAL_COMMUNITY)
Admission: RE | Admit: 2012-08-15 | Discharge: 2012-08-15 | Disposition: A | Discharge status: Home / Self Care | Payer: 59 | Source: Ambulatory Visit | Attending: General Surgery | Admitting: General Surgery

## 2012-08-15 ENCOUNTER — Ambulatory Visit (HOSPITAL_COMMUNITY): Payer: 59

## 2012-08-15 ENCOUNTER — Ambulatory Visit (HOSPITAL_COMMUNITY)
Admission: RE | Admit: 2012-08-15 | Discharge: 2012-08-15 | Disposition: A | Discharge status: Home / Self Care | Payer: 59 | Source: Ambulatory Visit | Attending: General Surgery | Admitting: General Surgery

## 2012-08-15 ENCOUNTER — Other Ambulatory Visit: Payer: Self-pay

## 2012-08-15 HISTORY — DX: Gastro-esophageal reflux disease without esophagitis: K21.9

## 2012-08-15 HISTORY — DX: Malignant (primary) neoplasm, unspecified: C80.1

## 2012-08-15 LAB — CBC WITH DIFFERENTIAL/PLATELET
Basophils Absolute: 0.1 10*3/uL (ref 0.0–0.1)
Eosinophils Absolute: 0.1 10*3/uL (ref 0.0–0.7)
Lymphocytes Relative: 40 % (ref 12–46)
Lymphs Abs: 2 10*3/uL (ref 0.7–4.0)
Neutrophils Relative %: 48 % (ref 43–77)
Platelets: 297 10*3/uL (ref 150–400)
RBC: 4.36 MIL/uL (ref 3.87–5.11)
WBC: 5 10*3/uL (ref 4.0–10.5)

## 2012-08-15 LAB — COMPREHENSIVE METABOLIC PANEL
ALT: 12 U/L (ref 0–35)
AST: 21 U/L (ref 0–37)
Alkaline Phosphatase: 62 U/L (ref 39–117)
GFR calc Af Amer: 86 mL/min — ABNORMAL LOW (ref >90)
Glucose, Bld: 99 mg/dL (ref 70–99)
Potassium: 3.9 mEq/L (ref 3.5–5.1)
Sodium: 139 mEq/L (ref 135–145)
Total Protein: 7.5 g/dL (ref 6.0–8.3)

## 2012-08-15 LAB — SURGICAL PCR SCREEN: MRSA, PCR: NEGATIVE

## 2012-08-20 ENCOUNTER — Encounter (HOSPITAL_COMMUNITY): Payer: Self-pay | Admitting: Anesthesiology

## 2012-08-20 ENCOUNTER — Encounter (HOSPITAL_COMMUNITY)
Admission: RE | Disposition: A | Discharge status: Home / Self Care | Payer: Self-pay | Source: Ambulatory Visit | Attending: General Surgery

## 2012-08-20 ENCOUNTER — Encounter (HOSPITAL_COMMUNITY): Payer: Self-pay | Admitting: *Deleted

## 2012-08-20 ENCOUNTER — Ambulatory Visit (HOSPITAL_COMMUNITY): Payer: 59 | Admitting: Anesthesiology

## 2012-08-20 ENCOUNTER — Observation Stay (HOSPITAL_COMMUNITY)
Admission: RE | Admit: 2012-08-20 | Discharge: 2012-08-21 | DRG: 627 | Disposition: A | Discharge status: Home / Self Care | Payer: 59 | Source: Ambulatory Visit | Attending: General Surgery | Admitting: General Surgery

## 2012-08-20 DIAGNOSIS — I10 Essential (primary) hypertension: Secondary | ICD-10-CM | POA: Diagnosis present

## 2012-08-20 DIAGNOSIS — C73 Malignant neoplasm of thyroid gland: Principal | ICD-10-CM | POA: Diagnosis present

## 2012-08-20 DIAGNOSIS — Z882 Allergy status to sulfonamides status: Secondary | ICD-10-CM

## 2012-08-20 HISTORY — PX: THYROIDECTOMY: SHX17

## 2012-08-20 LAB — COMPREHENSIVE METABOLIC PANEL
Albumin: 3.6 g/dL (ref 3.5–5.2)
BUN: 18 mg/dL (ref 6–23)
Calcium: 9.5 mg/dL (ref 8.4–10.5)
Chloride: 99 mEq/L (ref 96–112)
Creatinine, Ser: 0.85 mg/dL (ref 0.50–1.10)
Total Bilirubin: 0.3 mg/dL (ref 0.3–1.2)

## 2012-08-20 SURGERY — THYROIDECTOMY
Anesthesia: General | Site: Neck | Wound class: Clean

## 2012-08-20 MED ORDER — HYDROMORPHONE HCL PF 1 MG/ML IJ SOLN
INTRAMUSCULAR | Status: AC
  Administered 2012-08-20: 1 mg via INTRAVENOUS
  Filled 2012-08-20: qty 1

## 2012-08-20 MED ORDER — SODIUM CHLORIDE 0.9 % IR SOLN
Status: DC | PRN
  Administered 2012-08-20: 1000 mL

## 2012-08-20 MED ORDER — LACTATED RINGERS IV SOLN
INTRAVENOUS | Status: DC
  Administered 2012-08-20: 75 mL/h via INTRAVENOUS
  Administered 2012-08-21: 01:00:00 via INTRAVENOUS

## 2012-08-20 MED ORDER — ONDANSETRON HCL 4 MG/2ML IJ SOLN: 4.0000 mg | Freq: Once | INTRAMUSCULAR | Status: DC | PRN

## 2012-08-20 MED ORDER — ONDANSETRON HCL 4 MG/2ML IJ SOLN
INTRAMUSCULAR | Status: AC
  Filled 2012-08-20: qty 2

## 2012-08-20 MED ORDER — LACTATED RINGERS IV SOLN
INTRAVENOUS | Status: DC
  Administered 2012-08-20: 1000 mL via INTRAVENOUS

## 2012-08-20 MED ORDER — FENTANYL CITRATE 0.05 MG/ML IJ SOLN
INTRAMUSCULAR | Status: AC
  Filled 2012-08-20: qty 5

## 2012-08-20 MED ORDER — GLYCOPYRROLATE 0.2 MG/ML IJ SOLN
INTRAMUSCULAR | Status: AC
  Filled 2012-08-20: qty 3

## 2012-08-20 MED ORDER — ROCURONIUM BROMIDE 100 MG/10ML IV SOLN
INTRAVENOUS | Status: DC | PRN
  Administered 2012-08-20: 35 mg via INTRAVENOUS

## 2012-08-20 MED ORDER — PROPOFOL 10 MG/ML IV EMUL
INTRAVENOUS | Status: AC
  Filled 2012-08-20: qty 20

## 2012-08-20 MED ORDER — GLYCOPYRROLATE 0.2 MG/ML IJ SOLN
INTRAMUSCULAR | Status: DC | PRN
  Administered 2012-08-20: 0.6 mg via INTRAVENOUS

## 2012-08-20 MED ORDER — FENTANYL CITRATE 0.05 MG/ML IJ SOLN: 25.0000 ug | INTRAMUSCULAR | Status: DC | PRN

## 2012-08-20 MED ORDER — NEOSTIGMINE METHYLSULFATE 1 MG/ML IJ SOLN
INTRAMUSCULAR | Status: DC | PRN
  Administered 2012-08-20: 4 mg via INTRAVENOUS

## 2012-08-20 MED ORDER — MIDAZOLAM HCL 2 MG/2ML IJ SOLN
1.0000 mg | INTRAMUSCULAR | Status: DC | PRN
  Administered 2012-08-20 (×2): 2 mg via INTRAVENOUS

## 2012-08-20 MED ORDER — MIDAZOLAM HCL 2 MG/2ML IJ SOLN
INTRAMUSCULAR | Status: AC
  Filled 2012-08-20: qty 2

## 2012-08-20 MED ORDER — HEMOSTATIC AGENTS (NO CHARGE) OPTIME
TOPICAL | Status: DC | PRN
  Administered 2012-08-20: 1 via TOPICAL

## 2012-08-20 MED ORDER — HYDROMORPHONE HCL PF 1 MG/ML IJ SOLN
1.0000 mg | INTRAMUSCULAR | Status: DC | PRN
  Administered 2012-08-20: 1 mg via INTRAVENOUS

## 2012-08-20 MED ORDER — ENOXAPARIN SODIUM 40 MG/0.4ML ~~LOC~~ SOLN
40.0000 mg | Freq: Once | SUBCUTANEOUS | Status: AC
  Administered 2012-08-20: 40 mg via SUBCUTANEOUS

## 2012-08-20 MED ORDER — FENTANYL CITRATE 0.05 MG/ML IJ SOLN
INTRAMUSCULAR | Status: DC | PRN
  Administered 2012-08-20: 100 ug via INTRAVENOUS
  Administered 2012-08-20 (×2): 50 ug via INTRAVENOUS
  Administered 2012-08-20: 100 ug via INTRAVENOUS

## 2012-08-20 MED ORDER — NEOSTIGMINE METHYLSULFATE 1 MG/ML IJ SOLN
INTRAMUSCULAR | Status: AC
  Filled 2012-08-20: qty 1

## 2012-08-20 MED ORDER — ONDANSETRON HCL 4 MG PO TABS: 4.0000 mg | ORAL_TABLET | Freq: Four times a day (QID) | ORAL | Status: DC | PRN

## 2012-08-20 MED ORDER — ACETAMINOPHEN 10 MG/ML IV SOLN
INTRAVENOUS | Status: AC
  Filled 2012-08-20: qty 100

## 2012-08-20 MED ORDER — FENTANYL CITRATE 0.05 MG/ML IJ SOLN
INTRAMUSCULAR | Status: AC
  Filled 2012-08-20: qty 2

## 2012-08-20 MED ORDER — IRBESARTAN 300 MG PO TABS
300.0000 mg | ORAL_TABLET | Freq: Every day | ORAL | Status: DC
  Administered 2012-08-20: 300 mg via ORAL
  Filled 2012-08-20: qty 1

## 2012-08-20 MED ORDER — LACTATED RINGERS IV SOLN
INTRAVENOUS | Status: DC | PRN
  Administered 2012-08-20 (×2): via INTRAVENOUS

## 2012-08-20 MED ORDER — ENOXAPARIN SODIUM 40 MG/0.4ML ~~LOC~~ SOLN
SUBCUTANEOUS | Status: AC
  Filled 2012-08-20: qty 0.4

## 2012-08-20 MED ORDER — ONDANSETRON HCL 4 MG/2ML IJ SOLN
4.0000 mg | Freq: Once | INTRAMUSCULAR | Status: AC
  Administered 2012-08-20: 4 mg via INTRAVENOUS

## 2012-08-20 MED ORDER — LEVOTHYROXINE SODIUM 75 MCG PO TABS
150.0000 ug | ORAL_TABLET | Freq: Every day | ORAL | Status: DC
  Administered 2012-08-21: 150 ug via ORAL
  Filled 2012-08-20: qty 2

## 2012-08-20 MED ORDER — MENTHOL 3 MG MT LOZG
1.0000 | LOZENGE | OROMUCOSAL | Status: DC | PRN
  Filled 2012-08-20: qty 9

## 2012-08-20 MED ORDER — BUPIVACAINE HCL (PF) 0.5 % IJ SOLN
INTRAMUSCULAR | Status: AC
  Filled 2012-08-20: qty 30

## 2012-08-20 MED ORDER — LIDOCAINE HCL (CARDIAC) 10 MG/ML IV SOLN
INTRAVENOUS | Status: DC | PRN
  Administered 2012-08-20: 50 mg via INTRAVENOUS

## 2012-08-20 MED ORDER — AMLODIPINE BESYLATE 5 MG PO TABS
5.0000 mg | ORAL_TABLET | Freq: Every day | ORAL | Status: DC
  Administered 2012-08-20: 5 mg via ORAL
  Filled 2012-08-20: qty 1

## 2012-08-20 MED ORDER — PROPOFOL 10 MG/ML IV EMUL
INTRAVENOUS | Status: DC | PRN
  Administered 2012-08-20: 150 mg via INTRAVENOUS

## 2012-08-20 MED ORDER — ENOXAPARIN SODIUM 30 MG/0.3ML ~~LOC~~ SOLN
40.0000 mg | SUBCUTANEOUS | Status: DC
  Filled 2012-08-20: qty 0.3

## 2012-08-20 MED ORDER — LIDOCAINE HCL (PF) 1 % IJ SOLN
INTRAMUSCULAR | Status: AC
  Filled 2012-08-20: qty 5

## 2012-08-20 MED ORDER — ROCURONIUM BROMIDE 50 MG/5ML IV SOLN
INTRAVENOUS | Status: AC
  Filled 2012-08-20: qty 1

## 2012-08-20 MED ORDER — OLMESARTAN-AMLODIPINE-HCTZ 40-5-25 MG PO TABS: 1.0000 | ORAL_TABLET | Freq: Every day | ORAL | Status: DC

## 2012-08-20 MED ORDER — BUPIVACAINE HCL (PF) 0.5 % IJ SOLN
INTRAMUSCULAR | Status: DC | PRN
  Administered 2012-08-20: 4 mL

## 2012-08-20 MED ORDER — ONDANSETRON HCL 4 MG/2ML IJ SOLN: 4.0000 mg | Freq: Four times a day (QID) | INTRAMUSCULAR | Status: DC | PRN

## 2012-08-20 MED ORDER — SUCCINYLCHOLINE CHLORIDE 20 MG/ML IJ SOLN
INTRAMUSCULAR | Status: AC
  Filled 2012-08-20: qty 1

## 2012-08-20 MED ORDER — HYDROCHLOROTHIAZIDE 25 MG PO TABS
25.0000 mg | ORAL_TABLET | Freq: Every day | ORAL | Status: DC
  Administered 2012-08-20: 25 mg via ORAL
  Filled 2012-08-20: qty 1

## 2012-08-20 MED ORDER — CHLORHEXIDINE GLUCONATE 4 % EX LIQD: 1.0000 "application " | Freq: Once | CUTANEOUS | Status: DC

## 2012-08-20 MED ORDER — ACETAMINOPHEN 10 MG/ML IV SOLN
1000.0000 mg | Freq: Four times a day (QID) | INTRAVENOUS | Status: AC
  Administered 2012-08-20 – 2012-08-21 (×4): 1000 mg via INTRAVENOUS
  Filled 2012-08-20 (×3): qty 100

## 2012-08-20 SURGICAL SUPPLY — 63 items
ADH SKN CLS APL DERMABOND .7 (GAUZE/BANDAGES/DRESSINGS) ×1
APPLIER CLIP 11 MED OPEN (CLIP)
APPLIER CLIP 9.375 SM OPEN (CLIP) ×6
APR CLP MED 11 20 MLT OPN (CLIP)
APR CLP SM 9.3 20 MLT OPN (CLIP) ×3
ATTRACTOMAT 16X20 MAGNETIC DRP (DRAPES) ×2 IMPLANT
BAG HAMPER (MISCELLANEOUS) ×2 IMPLANT
BLADE SURG 15 STRL LF DISP TIS (BLADE) ×1 IMPLANT
BLADE SURG 15 STRL SS (BLADE) ×2
BLADE SURG SZ10 CARB STEEL (BLADE) ×2 IMPLANT
CLIP APPLIE 11 MED OPEN (CLIP) ×1 IMPLANT
CLIP APPLIE 9.375 SM OPEN (CLIP) ×1 IMPLANT
CLOTH BEACON ORANGE TIMEOUT ST (SAFETY) ×2 IMPLANT
COVER LIGHT HANDLE STERIS (MISCELLANEOUS) ×4 IMPLANT
DECANTER SPIKE VIAL GLASS SM (MISCELLANEOUS) ×2 IMPLANT
DERMABOND ADVANCED (GAUZE/BANDAGES/DRESSINGS) ×1
DERMABOND ADVANCED .7 DNX12 (GAUZE/BANDAGES/DRESSINGS) ×1 IMPLANT
DRAPE PED LAPAROTOMY (DRAPES) ×2 IMPLANT
DRAPE PROXIMA HALF (DRAPES) ×4 IMPLANT
DURAPREP 26ML APPLICATOR (WOUND CARE) ×2 IMPLANT
ELECT NEEDLE TIP 2.8 STRL (NEEDLE) ×2 IMPLANT
ELECT REM PT RETURN 9FT ADLT (ELECTROSURGICAL) ×2
ELECTRODE REM PT RTRN 9FT ADLT (ELECTROSURGICAL) ×1 IMPLANT
FORMALIN 10 PREFIL 120ML (MISCELLANEOUS) ×4 IMPLANT
GAUZE SPONGE 4X4 16PLY XRAY LF (GAUZE/BANDAGES/DRESSINGS) ×4 IMPLANT
GLOVE BIO SURGEON STRL SZ7.5 (GLOVE) ×2 IMPLANT
GLOVE BIOGEL PI IND STRL 7.0 (GLOVE) IMPLANT
GLOVE BIOGEL PI INDICATOR 7.0 (GLOVE) ×4
GLOVE ECLIPSE 6.5 STRL STRAW (GLOVE) ×2 IMPLANT
GLOVE ECLIPSE 7.0 STRL STRAW (GLOVE) ×2 IMPLANT
GOWN STRL REIN XL XLG (GOWN DISPOSABLE) ×8 IMPLANT
HEMOSTAT SURGICEL 2X3 (HEMOSTASIS) IMPLANT
HEMOSTAT SURGICEL 4X8 (HEMOSTASIS) ×2 IMPLANT
KIT BLADEGUARD II DBL (SET/KITS/TRAYS/PACK) ×2 IMPLANT
KIT ROOM TURNOVER APOR (KITS) ×2 IMPLANT
MANIFOLD NEPTUNE II (INSTRUMENTS) ×2 IMPLANT
MARKER SKIN DUAL TIP RULER LAB (MISCELLANEOUS) ×2 IMPLANT
NDL HYPO 25X1 1.5 SAFETY (NEEDLE) ×1 IMPLANT
NEEDLE HYPO 25X1 1.5 SAFETY (NEEDLE) ×2 IMPLANT
NS IRRIG 1000ML POUR BTL (IV SOLUTION) ×2 IMPLANT
PACK BASIC III (CUSTOM PROCEDURE TRAY) ×2
PACK SRG BSC III STRL LF ECLPS (CUSTOM PROCEDURE TRAY) ×1 IMPLANT
PAD ARMBOARD 7.5X6 YLW CONV (MISCELLANEOUS) ×2 IMPLANT
PENCIL HANDSWITCHING (ELECTRODE) ×2 IMPLANT
SET BASIN LINEN APH (SET/KITS/TRAYS/PACK) ×2 IMPLANT
SHEARS HARMONIC 9CM CVD (BLADE) IMPLANT
SPONGE INTESTINAL PEANUT (DISPOSABLE) ×7 IMPLANT
STAPLER VISISTAT (STAPLE) ×2 IMPLANT
SUT ETHILON 3 0 FSL (SUTURE) ×2 IMPLANT
SUT ETHILON 4 0 PS 2 18 (SUTURE) ×2 IMPLANT
SUT SILK 2 0 (SUTURE) ×2
SUT SILK 2 0 SH (SUTURE) ×2 IMPLANT
SUT SILK 2-0 18XBRD TIE 12 (SUTURE) ×1 IMPLANT
SUT SILK 3 0 (SUTURE)
SUT SILK 3-0 18XBRD TIE 12 (SUTURE) IMPLANT
SUT VIC AB 2-0 CT2 27 (SUTURE) ×2 IMPLANT
SUT VIC AB 3-0 SH 27 (SUTURE) ×2
SUT VIC AB 3-0 SH 27X BRD (SUTURE) ×1 IMPLANT
SUT VIC AB 4-0 PS2 27 (SUTURE) ×2 IMPLANT
SYR CONTROL 10ML LL (SYRINGE) ×2 IMPLANT
SYSTEM CHEST DRAIN TLS 7FR (DRAIN) ×2 IMPLANT
TOWEL OR 17X26 4PK STRL BLUE (TOWEL DISPOSABLE) IMPLANT
YANKAUER SUCT BULB TIP 10FT TU (MISCELLANEOUS) ×2 IMPLANT

## 2012-08-20 NOTE — Op Note (Signed)
Patient:  Sophia Wilson  DOB:  05/27/1956  MRN:  [id number]   Preop Diagnosis:  Papillary thyroid carcinoma  Postop Diagnosis:  Same  Procedure:  Total thyroidectomy  Surgeon:  Franky Macho, M.D.  Anes:  General endotracheal  Indications:  Patient is a 57 year old black female with a biopsy-proven papillary thyroid carcinoma within the isthmus. The patient now presents for total thyroidectomy. The risks and benefits of the procedure including bleeding, infection, and nerve injury as well as hypocalcemia were fully explained to the patient, who gave informed consent.  Procedure note:  The patient was placed in the supine position with the head extended after induction of general endotracheal anesthesia. The neck was prepped and draped using usual sterile technique with DuraPrep. Surgical site confirmation was performed.  A transverse incision was made 2 fingerbreadths above the jugular notch. The platysma was divided without difficulty. The strap muscles were then bluntly separated longitudinally. The left lobe of the thyroid gland was first identified. Clips were placed on the inferior thyroidal artery, middle thyroidal artery and vein, and the suspensory ligament of Gery Pray. Both parathyroid glands were identified. The recurrent laryngeal nerve was not seen or exposed. The isthmus was then dissected free from the trachea. The right thyroid gland was likewise excised in a similar fashion as the left thyroid lobe was. Both lobes were within normal limits for size. The palpable nodule was within the isthmus of the thyroid gland. A silk suture was placed in the right lobe of the thyroid gland for orientation purposes. The specimen was sent in toto to pathology for further examination. The wound was irrigated normal saline. Surgicel is placed in both thyroid lobe beds. A #5 drain was then placed into the thyroid beds and brought out through separate stab wound to the right of the incision. The strap  muscles reapproximated using a 2-0 Vicryl running suture. The platysma was reapproximated using 3-0 Vicryl running suture. The skin was closed using a 4 Vicryl subcuticular suture. 0.5% Sensorcaine was instilled the surrounding wound. Dermabond was then applied. The drain was secured in place using 3-0 nylon suture.  All tape and needle counts were correct at the end of the procedure. The patient was extubated in the operating room and transferred to PACU in stable condition. She was able to phonate the letter "E".  Complications:  None  EBL:  25 cc  Specimen:  Thyroid gland, suture right lobe

## 2012-08-20 NOTE — Transfer of Care (Signed)
  Anesthesia Post-op Note  Patient: Sophia Wilson  Procedure(s) Performed: Procedure(s) (LRB) with comments: THYROIDECTOMY (N/A) - Total Thyroidectomy  Patient Location: PACU  Anesthesia Type: General  Level of Consciousness: awake, alert , oriented and patient cooperative  Airway and Oxygen Therapy: Patient Spontanous Breathing and Patient connected to face mask oxygen  Post-op Pain: mild  Post-op Assessment: Post-op Vital signs reviewed, Patient's Cardiovascular Status Stable, Respiratory Function Stable, Patent Airway and No signs of Nausea or vomiting  Post-op Vital Signs: Reviewed and stable  Complications: No apparent anesthesia complications  

## 2012-08-20 NOTE — Anesthesia Postprocedure Evaluation (Signed)
  Anesthesia Post-op Note  Patient: Sophia Wilson  Procedure(s) Performed: Procedure(s) (LRB) with comments: THYROIDECTOMY (N/A) - Total Thyroidectomy  Patient Location: PACU  Anesthesia Type: General  Level of Consciousness: awake, alert , oriented and patient cooperative  Airway and Oxygen Therapy: Patient Spontanous Breathing and Patient connected to face mask oxygen  Post-op Pain: mild  Post-op Assessment: Post-op Vital signs reviewed, Patient's Cardiovascular Status Stable, Respiratory Function Stable, Patent Airway and No signs of Nausea or vomiting  Post-op Vital Signs: Reviewed and stable  Complications: No apparent anesthesia complications

## 2012-08-20 NOTE — Anesthesia Preprocedure Evaluation (Signed)
Anesthesia Evaluation  Patient identified by MRN, date of birth, ID band Patient awake    Reviewed: Allergy & Precautions, H&P , NPO status , Patient's Chart, lab work & pertinent test results  Airway Mallampati: I TM Distance: >3 FB Neck ROM: Full    Dental  (+) Teeth Intact   Pulmonary neg pulmonary ROS,  breath sounds clear to auscultation        Cardiovascular hypertension, Pt. on medications Rhythm:Regular Rate:Normal     Neuro/Psych    GI/Hepatic GERD-  Medicated and Controlled,  Endo/Other    Renal/GU      Musculoskeletal   Abdominal   Peds  Hematology   Anesthesia Other Findings   Reproductive/Obstetrics                           Anesthesia Physical Anesthesia Plan  ASA: II  Anesthesia Plan: General   Post-op Pain Management:    Induction: Rapid sequence, Cricoid pressure planned and Intravenous  Airway Management Planned: Oral ETT  Additional Equipment:   Intra-op Plan:   Post-operative Plan: Extubation in OR  Informed Consent: I have reviewed the patients History and Physical, chart, labs and discussed the procedure including the risks, benefits and alternatives for the proposed anesthesia with the patient or authorized representative who has indicated his/her understanding and acceptance.     Plan Discussed with:   Anesthesia Plan Comments:         Anesthesia Quick Evaluation

## 2012-08-20 NOTE — Progress Notes (Signed)
Pt able to pronounce E without difficulty.  Will cont to monitor.

## 2012-08-20 NOTE — Preoperative (Signed)
Beta Blockers   Reason not to administer Beta Blockers:Not Applicable 

## 2012-08-20 NOTE — Anesthesia Procedure Notes (Signed)
Procedure Name: Intubation Date/Time: 08/20/2012 7:42 AM Performed by: Carolyne Littles, Brittnae Aschenbrenner L Pre-anesthesia Checklist: Patient identified, Patient being monitored, Timeout performed, Emergency Drugs available and Suction available Patient Re-evaluated:Patient Re-evaluated prior to inductionOxygen Delivery Method: Circle System Utilized Preoxygenation: Pre-oxygenation with 100% oxygen Intubation Type: IV induction and Cricoid Pressure applied Ventilation: Mask ventilation without difficulty Laryngoscope Size: 3 and Miller Grade View: Grade I Tube type: Oral Tube size: 7.0 mm Number of attempts: 1 Airway Equipment and Method: stylet Placement Confirmation: ETT inserted through vocal cords under direct vision,  positive ETCO2 and breath sounds checked- equal and bilateral Secured at: 21 cm Tube secured with: Tape Dental Injury: Teeth and Oropharynx as per pre-operative assessment

## 2012-08-20 NOTE — Interval H&P Note (Signed)
History and Physical Interval Note:  08/20/2012 7:29 AM  Sophia Wilson  has presented today for surgery, with the diagnosis of Thyroid Cancer  The various methods of treatment have been discussed with the patient and family. After consideration of risks, benefits and other options for treatment, the patient has consented to  Procedure(s) (LRB) with comments: THYROIDECTOMY (N/A) as a surgical intervention .  The patient's history has been reviewed, patient examined, no change in status, stable for surgery.  I have reviewed the patient's chart and labs.  Questions were answered to the patient's satisfaction.     Franky Macho A

## 2012-08-21 ENCOUNTER — Encounter (HOSPITAL_COMMUNITY): Payer: Self-pay | Admitting: General Surgery

## 2012-08-21 LAB — COMPREHENSIVE METABOLIC PANEL
AST: 13 U/L (ref 0–37)
Albumin: 3.1 g/dL — ABNORMAL LOW (ref 3.5–5.2)
BUN: 20 mg/dL (ref 6–23)
CO2: 30 mEq/L (ref 19–32)
Calcium: 8.7 mg/dL (ref 8.4–10.5)
Creatinine, Ser: 1.25 mg/dL — ABNORMAL HIGH (ref 0.50–1.10)
GFR calc non Af Amer: 47 mL/min — ABNORMAL LOW (ref >90)

## 2012-08-21 LAB — CBC
HCT: 29.9 % — ABNORMAL LOW (ref 36.0–46.0)
MCHC: 33.4 g/dL (ref 30.0–36.0)
RDW: 15.9 % — ABNORMAL HIGH (ref 11.5–15.5)
WBC: 6.3 10*3/uL (ref 4.0–10.5)

## 2012-08-21 LAB — PHOSPHORUS: Phosphorus: 4 mg/dL (ref 2.3–4.6)

## 2012-08-21 LAB — MAGNESIUM: Magnesium: 2 mg/dL (ref 1.5–2.5)

## 2012-08-21 MED ORDER — HYDROCODONE-ACETAMINOPHEN 5-325 MG PO TABS: 1.0000 | ORAL_TABLET | Freq: Four times a day (QID) | ORAL | Status: DC | PRN

## 2012-08-21 MED ORDER — CALCIUM CARBONATE-VITAMIN D 500-200 MG-UNIT PO TABS: 1.0000 | ORAL_TABLET | Freq: Two times a day (BID) | ORAL | Status: DC

## 2012-08-21 MED ORDER — LEVOTHYROXINE SODIUM 150 MCG PO TABS: 150.0000 ug | ORAL_TABLET | Freq: Every day | ORAL | Status: DC

## 2012-08-21 NOTE — Progress Notes (Signed)
Pt discharge instructions, medications and f/u appt discussed with pt. Pt verbalized understanding. Pt d/ced home at this time. Taken out of facility via w/c by staff.

## 2012-08-21 NOTE — Discharge Summary (Signed)
Physician Discharge Summary  Patient ID: Sophia Wilson MRN: [id number] DOB/AGE: [birth year] 57 y.o.  Admit date: 08/20/2012 Discharge date: 08/21/2012  Admission Diagnoses: Papillary thyroid carcinoma  Discharge Diagnoses: Same Active Problems:  * No active hospital problems. *    Discharged Condition: good  Hospital Course: Patient is a 4 she'll black female who was found on biopsy of the thyroid gland to have papillary thyroid carcinoma. She underwent a total thyroidectomy on 08/20/2012. She tolerated the procedure well. Her postoperative course was unremarkable. No voice changes were noted. Her calcium level remained within normal limits. Her diet was advanced without difficulty. She is being discharged home in good improving condition.  Treatments: surgery: Total thyroidectomy on 08/20/2012  Discharge Exam: Blood pressure 108/71, pulse 56, temperature 98.5 F (36.9 C), temperature source Oral, resp. rate 12, weight 111.7 kg (246 lb 4.1 oz), SpO2 99.00%. General appearance: alert, cooperative and no distress Neck: Incision healing well, drain removed. No hematoma noted. Resp: clear to auscultation bilaterally Cardio: regular rate and rhythm, S1, S2 normal, no murmur, click, rub or gallop  Disposition: 01-Home or Self Care     Medication List     As of 08/21/2012  8:49 AM    TAKE these medications         calcium-vitamin D 500-200 MG-UNIT per tablet   Commonly known as: OSCAL WITH D   Take 1 tablet by mouth 2 (two) times daily.      fluticasone 50 MCG/ACT nasal spray   Commonly known as: FLONASE   Place 2 sprays into the nose daily as needed. For allergies      HYDROcodone-acetaminophen 5-325 MG per tablet   Commonly known as: NORCO/VICODIN   Take 1-2 tablets by mouth every 6 (six) hours as needed for pain.      levothyroxine 150 MCG tablet   Commonly known as: SYNTHROID, LEVOTHROID   Take 1 tablet (150 mcg total) by mouth daily before breakfast.      loratadine  10 MG tablet   Commonly known as: CLARITIN   Take 10 mg by mouth daily as needed. For allergies      meloxicam 15 MG tablet   Commonly known as: MOBIC   Take 15 mg by mouth daily.      TRIBENZOR 40-5-25 MG Tabs   Generic drug: Olmesartan-Amlodipine-HCTZ   Take 1 tablet by mouth daily.           Follow-up Information    Follow up with Dalia Heading, MD. Schedule an appointment as soon as possible for a visit on 08/26/2012.   Contact information:   [address] [place] [address] [phone]          Signed: Franky Macho A 08/21/2012, 8:49 AM

## 2012-08-25 NOTE — Progress Notes (Signed)
UR Chart Review Completed  

## 2012-09-01 ENCOUNTER — Other Ambulatory Visit (HOSPITAL_COMMUNITY): Payer: Self-pay | Admitting: "Endocrinology

## 2012-09-01 DIAGNOSIS — C73 Malignant neoplasm of thyroid gland: Secondary | ICD-10-CM

## 2012-09-03 ENCOUNTER — Encounter (HOSPITAL_COMMUNITY): Payer: Self-pay

## 2012-09-03 ENCOUNTER — Encounter (HOSPITAL_COMMUNITY)
Admission: RE | Admit: 2012-09-03 | Discharge: 2012-09-03 | Disposition: A | Discharge status: Home / Self Care | Payer: 59 | Source: Ambulatory Visit | Attending: "Endocrinology | Admitting: "Endocrinology

## 2012-09-03 DIAGNOSIS — C73 Malignant neoplasm of thyroid gland: Secondary | ICD-10-CM | POA: Insufficient documentation

## 2012-09-03 HISTORY — DX: Systemic involvement of connective tissue, unspecified: M35.9

## 2012-09-03 MED ORDER — THYROTROPIN ALFA 1.1 MG IM SOLR
INTRAMUSCULAR | Status: AC
  Administered 2012-09-03: 0.9 mg via INTRAMUSCULAR
  Filled 2012-09-03: qty 0.9

## 2012-09-03 NOTE — Progress Notes (Signed)
Thyrogen 0.61mL given IM in RUOQ

## 2012-09-04 ENCOUNTER — Encounter (HOSPITAL_COMMUNITY): Payer: Self-pay

## 2012-09-04 ENCOUNTER — Encounter (HOSPITAL_COMMUNITY)
Admission: RE | Admit: 2012-09-04 | Discharge: 2012-09-04 | Disposition: A | Discharge status: Home / Self Care | Payer: 59 | Source: Ambulatory Visit | Attending: "Endocrinology | Admitting: "Endocrinology

## 2012-09-04 MED ORDER — THYROTROPIN ALFA 1.1 MG IM SOLR
INTRAMUSCULAR | Status: AC
  Filled 2012-09-04: qty 0.9

## 2012-09-04 MED ORDER — THYROTROPIN ALFA 1.1 MG IM SOLR
INTRAMUSCULAR | Status: AC
  Administered 2012-09-04: 0.9 mg via INTRAMUSCULAR
  Filled 2012-09-04: qty 0.9

## 2012-09-04 NOTE — Progress Notes (Signed)
Thyrogen 0.9mg  administered IM in LUOQ

## 2012-09-05 ENCOUNTER — Encounter (HOSPITAL_COMMUNITY): Payer: Self-pay

## 2012-09-05 ENCOUNTER — Encounter (HOSPITAL_COMMUNITY)
Admission: RE | Admit: 2012-09-05 | Discharge: 2012-09-05 | Disposition: A | Discharge status: Home / Self Care | Payer: 59 | Source: Ambulatory Visit | Attending: "Endocrinology | Admitting: "Endocrinology

## 2012-09-05 MED ORDER — SODIUM IODIDE I 131 CAPSULE
75.0000 | Freq: Once | INTRAVENOUS | Status: AC | PRN
  Administered 2012-09-05: 75 via ORAL

## 2012-09-10 MED FILL — Thyrotropin Alfa For Inj 1.1 MG: INTRAMUSCULAR | Qty: 0.9 | Status: AC

## 2012-09-15 ENCOUNTER — Encounter (HOSPITAL_COMMUNITY): Payer: Self-pay

## 2012-09-15 ENCOUNTER — Encounter (HOSPITAL_COMMUNITY)
Admission: RE | Admit: 2012-09-15 | Discharge: 2012-09-15 | Disposition: A | Discharge status: Home / Self Care | Payer: 59 | Source: Ambulatory Visit | Attending: "Endocrinology | Admitting: "Endocrinology

## 2012-09-15 DIAGNOSIS — C73 Malignant neoplasm of thyroid gland: Secondary | ICD-10-CM | POA: Insufficient documentation

## 2013-04-14 ENCOUNTER — Other Ambulatory Visit (HOSPITAL_COMMUNITY): Payer: Self-pay | Admitting: "Endocrinology

## 2013-04-14 DIAGNOSIS — C73 Malignant neoplasm of thyroid gland: Secondary | ICD-10-CM

## 2013-05-04 ENCOUNTER — Ambulatory Visit (HOSPITAL_COMMUNITY)
Admission: RE | Admit: 2013-05-04 | Discharge: 2013-05-04 | Disposition: A | Discharge status: Home / Self Care | Payer: 59 | Source: Ambulatory Visit | Attending: "Endocrinology | Admitting: "Endocrinology

## 2013-05-04 DIAGNOSIS — Z09 Encounter for follow-up examination after completed treatment for conditions other than malignant neoplasm: Secondary | ICD-10-CM | POA: Insufficient documentation

## 2013-05-04 DIAGNOSIS — C73 Malignant neoplasm of thyroid gland: Secondary | ICD-10-CM | POA: Insufficient documentation

## 2013-05-05 ENCOUNTER — Other Ambulatory Visit (HOSPITAL_COMMUNITY): Payer: Self-pay | Admitting: Family Medicine

## 2013-05-05 DIAGNOSIS — Z139 Encounter for screening, unspecified: Secondary | ICD-10-CM

## 2013-05-18 ENCOUNTER — Ambulatory Visit (HOSPITAL_COMMUNITY)
Admission: RE | Admit: 2013-05-18 | Discharge: 2013-05-18 | Disposition: A | Discharge status: Home / Self Care | Payer: 59 | Source: Ambulatory Visit | Attending: Family Medicine | Admitting: Family Medicine

## 2013-05-18 DIAGNOSIS — Z139 Encounter for screening, unspecified: Secondary | ICD-10-CM

## 2013-05-18 DIAGNOSIS — Z1231 Encounter for screening mammogram for malignant neoplasm of breast: Secondary | ICD-10-CM | POA: Insufficient documentation

## 2013-06-29 ENCOUNTER — Encounter: Payer: Self-pay | Admitting: Adult Health

## 2013-06-29 ENCOUNTER — Ambulatory Visit (INDEPENDENT_AMBULATORY_CARE_PROVIDER_SITE_OTHER): Payer: 59 | Admitting: Adult Health

## 2013-06-29 VITALS — BP 112/74 | HR 74 | Ht 66.0 in | Wt 228.0 lb

## 2013-06-29 DIAGNOSIS — Z1212 Encounter for screening for malignant neoplasm of rectum: Secondary | ICD-10-CM

## 2013-06-29 DIAGNOSIS — Z01419 Encounter for gynecological examination (general) (routine) without abnormal findings: Secondary | ICD-10-CM

## 2013-06-29 LAB — HEMOCCULT GUIAC POC 1CARD (OFFICE): Fecal Occult Blood, POC: NEGATIVE

## 2013-06-29 NOTE — Patient Instructions (Signed)
Physical in 1 year Mammogram yearly  Colonoscopy in 2023 Labs per PCP and Dr Fransico Him

## 2013-06-29 NOTE — Progress Notes (Signed)
Patient ID: Sophia Wilson, female   DOB: 1955/11/22, 57 y.o.   MRN: [id number] History of Present Illness:  Sophia Wilson is a 57 year old black female married in for gyn physical.Has some vaginal dryness but uses [place].  Current Medications, Allergies, Past Medical History, Past Surgical History, Family History and Social History were reviewed in [employer] record.   Past Medical History  Diagnosis Date  . Hypertension   . H/O seasonal allergies   . Anemia   . GERD (gastroesophageal reflux disease)   . Cancer     thyroid cancer  . Collagen vascular disease   . Thyroid disease     cancer  . Vitiligo   . Herpes simplex without mention of complication     rt thigh   Past Surgical History  Procedure Laterality Date  . Abdominal hysterectomy    . Colonoscopy  03/10/2012    Procedure: COLONOSCOPY;  Surgeon: West Bali, MD;  Location: [facility];  Service: Endoscopy;  Laterality: N/A;  10:30 AM  . Thyroidectomy  08/20/2012    Procedure: THYROIDECTOMY;  Surgeon: Dalia Heading, MD;  Location: [facility];  Service: General;  Laterality: N/A;  Total Thyroidectomy  Current outpatient prescriptions:calcium-vitamin D (OSCAL 500/200 D-3) 500-200 MG-UNIT per tablet, Take 1 tablet by mouth 2 (two) times daily., Disp: , Rfl: ;  fluticasone (FLONASE) 50 MCG/ACT nasal spray, Place 2 sprays into the nose daily as needed. For allergies, Disp: , Rfl: ;  levothyroxine (SYNTHROID, LEVOTHROID) 150 MCG tablet, Take 137 mcg by mouth daily before breakfast., Disp: , Rfl:  loratadine (CLARITIN) 10 MG tablet, Take 10 mg by mouth daily as needed. For allergies, Disp: , Rfl: ;  meloxicam (MOBIC) 15 MG tablet, Take 15 mg by mouth daily.  , Disp: , Rfl: ;  Olmesartan-Amlodipine-HCTZ (TRIBENZOR) 40-5-25 MG TABS, Take 1 tablet by mouth daily.  , Disp: , Rfl: ;  HYDROcodone-acetaminophen (NORCO) 5-325 MG per tablet, Take 1-2 tablets by mouth every 6 (six) hours as needed for pain., Disp: 40 tablet, Rfl:  0  Review of Systems: Patient denies any headaches, blurred vision, shortness of breath, chest pain, abdominal pain, problems with bowel movements, urination, or intercourse. No mood swings has some body aches due to arthritis.    Physical Exam:BP 112/74  Pulse 74  Ht 5\' 6"  (1.676 m)  Wt 228 lb (103.42 kg)  BMI 36.82 kg/m2 General:  Well developed, well nourished, no acute distress Skin:  Warm and dry Neck:  Midline trachea,  Thyroid absent had surgery this year and had thyroid cancer Lungs; Clear to auscultation bilaterally Breast:  No dominant palpable mass, retraction, or nipple discharge Cardiovascular: Regular rate and rhythm Abdomen:  Soft, non tender, no hepatosplenomegaly Pelvic:  External genitalia is normal in appearance,except has vitilgo.  The vagina is normal in appearance. The cervix and uterus are absent.  No adnexal masses or tenderness noted. Rectal: Good sphincter tone, no polyps, or hemorrhoids felt.  Hemoccult negative. Extremities:  No swelling or varicosities noted Psych:  No mood changes, alert and cooperative, seems happy   Impression: Yearly gyn exam no pap    Plan:  Physical in 1 year Mammogram yearly Colonoscopy 2023 Labs with Dr Fransico Him and PCP

## 2013-08-14 ENCOUNTER — Other Ambulatory Visit (HOSPITAL_COMMUNITY): Payer: Self-pay | Admitting: "Endocrinology

## 2013-08-14 DIAGNOSIS — C73 Malignant neoplasm of thyroid gland: Secondary | ICD-10-CM

## 2013-08-20 ENCOUNTER — Ambulatory Visit (HOSPITAL_COMMUNITY)
Admission: RE | Admit: 2013-08-20 | Discharge: 2013-08-20 | Disposition: A | Discharge status: Home / Self Care | Payer: 59 | Source: Ambulatory Visit | Attending: "Endocrinology | Admitting: "Endocrinology

## 2013-08-20 DIAGNOSIS — C73 Malignant neoplasm of thyroid gland: Secondary | ICD-10-CM | POA: Insufficient documentation

## 2013-08-20 DIAGNOSIS — E0789 Other specified disorders of thyroid: Secondary | ICD-10-CM | POA: Insufficient documentation

## 2014-04-19 ENCOUNTER — Other Ambulatory Visit (HOSPITAL_COMMUNITY): Payer: Self-pay | Admitting: Family Medicine

## 2014-04-19 DIAGNOSIS — Z1231 Encounter for screening mammogram for malignant neoplasm of breast: Secondary | ICD-10-CM

## 2014-05-17 ENCOUNTER — Encounter: Payer: Self-pay | Admitting: Adult Health

## 2014-05-19 ENCOUNTER — Ambulatory Visit (HOSPITAL_COMMUNITY)
Admission: RE | Admit: 2014-05-19 | Discharge: 2014-05-19 | Disposition: A | Discharge status: Home / Self Care | Payer: BC Managed Care – PPO | Source: Ambulatory Visit | Attending: Family Medicine | Admitting: Family Medicine

## 2014-05-19 DIAGNOSIS — Z1231 Encounter for screening mammogram for malignant neoplasm of breast: Secondary | ICD-10-CM | POA: Insufficient documentation

## 2014-06-08 ENCOUNTER — Other Ambulatory Visit (HOSPITAL_COMMUNITY): Payer: Self-pay | Admitting: "Endocrinology

## 2014-06-08 DIAGNOSIS — C73 Malignant neoplasm of thyroid gland: Secondary | ICD-10-CM

## 2014-06-15 ENCOUNTER — Ambulatory Visit (HOSPITAL_COMMUNITY): Payer: BC Managed Care – PPO

## 2014-06-15 ENCOUNTER — Ambulatory Visit (HOSPITAL_COMMUNITY)
Admission: RE | Admit: 2014-06-15 | Discharge: 2014-06-15 | Disposition: A | Discharge status: Home / Self Care | Payer: BC Managed Care – PPO | Source: Ambulatory Visit | Attending: "Endocrinology | Admitting: "Endocrinology

## 2014-06-15 DIAGNOSIS — E890 Postprocedural hypothyroidism: Secondary | ICD-10-CM | POA: Diagnosis not present

## 2014-06-15 DIAGNOSIS — C73 Malignant neoplasm of thyroid gland: Secondary | ICD-10-CM | POA: Diagnosis not present

## 2014-06-30 ENCOUNTER — Encounter: Payer: Self-pay | Admitting: Adult Health

## 2014-06-30 ENCOUNTER — Ambulatory Visit (INDEPENDENT_AMBULATORY_CARE_PROVIDER_SITE_OTHER): Payer: BC Managed Care – PPO | Admitting: Adult Health

## 2014-06-30 VITALS — BP 138/80 | HR 78 | Ht 66.0 in | Wt 218.0 lb

## 2014-06-30 DIAGNOSIS — Z1212 Encounter for screening for malignant neoplasm of rectum: Secondary | ICD-10-CM

## 2014-06-30 DIAGNOSIS — Z01419 Encounter for gynecological examination (general) (routine) without abnormal findings: Secondary | ICD-10-CM

## 2014-06-30 LAB — HEMOCCULT GUIAC POC 1CARD (OFFICE): Fecal Occult Blood, POC: NEGATIVE

## 2014-06-30 NOTE — Patient Instructions (Signed)
Physical in 1 year Mammogram yearly Labs with Dr Dorris Fetch Colonoscopy per GI

## 2014-06-30 NOTE — Progress Notes (Signed)
Patient ID: Sophia Wilson, female   DOB: May 21, 1956, 58 y.o.   MRN: [id number] History of Present Illness: Sophia Wilson is a 58 year old black female, married in for gyn exam she is sp hysterectomy. Declines flu shot.Has lost 28 lbs since 2014, she is trying.  Current Medications, Allergies, Past Medical History, Past Surgical History, Family History and Social History were reviewed in [employer] record.     Review of Systems: Patient denies any headaches, blurred vision, shortness of breath, chest pain, abdominal pain, problems with bowel movements, urination, or intercourse. No joint pain is stiff at times after sitting, no mood swings.    Physical Exam:BP 138/80 mmHg  Pulse 78  Ht 5\' 6"  (1.676 m)  Wt 218 lb (98.884 kg)  BMI 35.20 kg/m2 General:  Well developed, well nourished, no acute distress Skin:  Warm and dry Neck:  Midline trachea,thyroid absent Lungs; Clear to auscultation bilaterally Breast:  No dominant palpable mass, retraction, or nipple discharge, large Cardiovascular: Regular rate and rhythm Abdomen:  Soft, non tender, no hepatosplenomegaly Pelvic:  External genitalia is normal in appearance, no lesions.  The vagina has decreased color, moisture and rugae. The cervix and uterus are absent.  No   adnexal masses or tenderness noted. Rectal: Good sphincter tone, no polyps, or hemorrhoids felt.  Hemoccult negative. Extremities:  No swelling or varicosities noted Psych:  No mood changes,alert and cooperative,seems happy   Impression: Well woman gyn exam no pap    Plan: Physical in 1 year Mammogram yearly Colonoscopy per GI Labs with Dr Sophia Wilson

## 2015-01-26 ENCOUNTER — Other Ambulatory Visit (HOSPITAL_COMMUNITY): Payer: Self-pay | Admitting: "Endocrinology

## 2015-01-26 DIAGNOSIS — C73 Malignant neoplasm of thyroid gland: Secondary | ICD-10-CM

## 2015-02-14 ENCOUNTER — Encounter (HOSPITAL_COMMUNITY)
Admission: RE | Admit: 2015-02-14 | Discharge: 2015-02-14 | Disposition: A | Discharge status: Home / Self Care | Payer: BLUE CROSS/BLUE SHIELD | Source: Ambulatory Visit | Attending: "Endocrinology | Admitting: "Endocrinology

## 2015-02-14 ENCOUNTER — Encounter (HOSPITAL_COMMUNITY): Payer: Self-pay

## 2015-02-14 DIAGNOSIS — C73 Malignant neoplasm of thyroid gland: Secondary | ICD-10-CM | POA: Insufficient documentation

## 2015-02-14 MED ORDER — SODIUM CHLORIDE 0.9 % IJ SOLN
INTRAMUSCULAR | Status: AC
  Filled 2015-02-14: qty 42

## 2015-02-14 MED ORDER — THYROTROPIN ALFA 1.1 MG IM SOLR
INTRAMUSCULAR | Status: AC
Start: 2015-02-14 — End: 2015-02-14
  Administered 2015-02-14: 0.9 mg via INTRAMUSCULAR
  Filled 2015-02-14: qty 0.9

## 2015-02-14 MED ORDER — THYROTROPIN ALFA 1.1 MG IM SOLR
0.9000 mg | INTRAMUSCULAR | Status: AC
  Administered 2015-02-14: 0.9 mg via INTRAMUSCULAR

## 2015-02-15 ENCOUNTER — Encounter (HOSPITAL_COMMUNITY)
Admission: RE | Admit: 2015-02-15 | Discharge: 2015-02-15 | Disposition: A | Discharge status: Home / Self Care | Payer: BLUE CROSS/BLUE SHIELD | Source: Ambulatory Visit | Attending: "Endocrinology | Admitting: "Endocrinology

## 2015-02-15 DIAGNOSIS — C73 Malignant neoplasm of thyroid gland: Secondary | ICD-10-CM

## 2015-02-15 MED ORDER — THYROTROPIN ALFA 1.1 MG IM SOLR: 0.9000 mg | INTRAMUSCULAR | Status: AC

## 2015-02-15 MED ORDER — THYROTROPIN ALFA 1.1 MG IM SOLR
0.9000 mg | INTRAMUSCULAR | Status: AC
  Administered 2015-02-15: 0.9 mg via INTRAMUSCULAR

## 2015-02-16 ENCOUNTER — Encounter (HOSPITAL_COMMUNITY)
Admission: RE | Admit: 2015-02-16 | Discharge: 2015-02-16 | Disposition: A | Discharge status: Home / Self Care | Payer: BLUE CROSS/BLUE SHIELD | Source: Ambulatory Visit | Attending: "Endocrinology | Admitting: "Endocrinology

## 2015-02-16 ENCOUNTER — Encounter (HOSPITAL_COMMUNITY): Payer: Self-pay

## 2015-02-16 MED ORDER — SODIUM IODIDE I 131 CAPSULE
4.1000 | Freq: Once | INTRAVENOUS | Status: AC | PRN
  Administered 2015-02-16: 4.1 via ORAL

## 2015-02-18 ENCOUNTER — Encounter (HOSPITAL_COMMUNITY)
Admission: RE | Admit: 2015-02-18 | Discharge: 2015-02-18 | Disposition: A | Discharge status: Home / Self Care | Payer: BLUE CROSS/BLUE SHIELD | Source: Ambulatory Visit | Attending: "Endocrinology | Admitting: "Endocrinology

## 2015-02-18 DIAGNOSIS — C73 Malignant neoplasm of thyroid gland: Secondary | ICD-10-CM | POA: Diagnosis not present

## 2015-03-10 ENCOUNTER — Other Ambulatory Visit: Payer: Self-pay | Admitting: "Endocrinology

## 2015-03-10 DIAGNOSIS — C73 Malignant neoplasm of thyroid gland: Secondary | ICD-10-CM

## 2015-03-15 ENCOUNTER — Ambulatory Visit (HOSPITAL_COMMUNITY): Admission: RE | Admit: 2015-03-15 | Payer: BLUE CROSS/BLUE SHIELD | Source: Ambulatory Visit

## 2015-03-18 ENCOUNTER — Ambulatory Visit (HOSPITAL_COMMUNITY)
Admission: RE | Admit: 2015-03-18 | Discharge: 2015-03-18 | Disposition: A | Discharge status: Home / Self Care | Payer: BLUE CROSS/BLUE SHIELD | Source: Ambulatory Visit | Attending: "Endocrinology | Admitting: "Endocrinology

## 2015-03-18 DIAGNOSIS — C73 Malignant neoplasm of thyroid gland: Secondary | ICD-10-CM

## 2015-03-18 DIAGNOSIS — R590 Localized enlarged lymph nodes: Secondary | ICD-10-CM | POA: Insufficient documentation

## 2015-04-18 ENCOUNTER — Other Ambulatory Visit (HOSPITAL_COMMUNITY): Payer: Self-pay | Admitting: Family Medicine

## 2015-04-18 DIAGNOSIS — Z1231 Encounter for screening mammogram for malignant neoplasm of breast: Secondary | ICD-10-CM

## 2015-05-03 ENCOUNTER — Other Ambulatory Visit: Payer: Self-pay | Admitting: "Endocrinology

## 2015-05-23 ENCOUNTER — Ambulatory Visit (HOSPITAL_COMMUNITY)
Admission: RE | Admit: 2015-05-23 | Discharge: 2015-05-23 | Disposition: A | Discharge status: Home / Self Care | Payer: BLUE CROSS/BLUE SHIELD | Source: Ambulatory Visit | Attending: Family Medicine | Admitting: Family Medicine

## 2015-05-23 DIAGNOSIS — Z1231 Encounter for screening mammogram for malignant neoplasm of breast: Secondary | ICD-10-CM | POA: Diagnosis present

## 2015-07-01 ENCOUNTER — Encounter: Payer: Self-pay | Admitting: "Endocrinology

## 2015-07-01 ENCOUNTER — Ambulatory Visit (INDEPENDENT_AMBULATORY_CARE_PROVIDER_SITE_OTHER): Payer: BLUE CROSS/BLUE SHIELD | Admitting: "Endocrinology

## 2015-07-01 VITALS — BP 151/87 | HR 73 | Ht 67.0 in | Wt 228.0 lb

## 2015-07-01 DIAGNOSIS — R7303 Prediabetes: Secondary | ICD-10-CM

## 2015-07-01 DIAGNOSIS — E890 Postprocedural hypothyroidism: Secondary | ICD-10-CM | POA: Diagnosis not present

## 2015-07-01 DIAGNOSIS — I10 Essential (primary) hypertension: Secondary | ICD-10-CM | POA: Insufficient documentation

## 2015-07-01 DIAGNOSIS — C73 Malignant neoplasm of thyroid gland: Secondary | ICD-10-CM | POA: Diagnosis not present

## 2015-07-01 MED ORDER — LEVOTHYROXINE SODIUM 125 MCG PO TABS: ORAL_TABLET | ORAL | Status: DC

## 2015-07-01 MED ORDER — METFORMIN HCL 500 MG PO TABS: 500.0000 mg | ORAL_TABLET | Freq: Every day | ORAL | Status: DC

## 2015-07-01 NOTE — Patient Instructions (Signed)

## 2015-07-01 NOTE — Progress Notes (Signed)
Subjective:    Patient ID: Sophia Wilson, female    DOB: Apr 06, 1956, PCP Purvis Kilts, MD   Past Medical History  Diagnosis Date  . Hypertension   . H/O seasonal allergies   . Anemia   . GERD (gastroesophageal reflux disease)   . Cancer [facility])     thyroid cancer  . Collagen vascular disease ([place])   . Thyroid disease     cancer  . Vitiligo   . Herpes simplex without mention of complication     rt thigh  . Eczema    Past Surgical History  Procedure Laterality Date  . Abdominal hysterectomy    . Colonoscopy  03/10/2012    Procedure: COLONOSCOPY;  Surgeon: Danie Binder, MD;  Location[facility];  Service: Endoscopy;  Laterality: N/A;  10:30 AM  . Thyroidectomy  08/20/2012    Procedure: THYROIDECTOMY;  Surgeon: Jamesetta So, MD;  Location: [facility];  Service: General;  Laterality: N/A;  Total Thyroidectomy   Social History   Social History  . Marital Status: Married    Spouse Name: N/A  . Number of Children: N/A  . Years of Education: N/A   Social History Main Topics  . Smoking status: Never Smoker   . Smokeless tobacco: Never Used  . Alcohol Use: No  . Drug Use: No  . Sexual Activity: Yes    Birth Control/ Protection: Surgical     Comment: hysterectomy   Other Topics Concern  . None   Social History Narrative   Outpatient Encounter Prescriptions as of 07/01/2015  Medication Sig  . calcium-vitamin D (OSCAL 500/200 D-3) 500-200 MG-UNIT per tablet Take 1 tablet by mouth 2 (two) times daily.  . fluticasone (FLONASE) 50 MCG/ACT nasal spray Place 2 sprays into the nose daily as needed. For allergies  . ibuprofen (ADVIL,MOTRIN) 200 MG tablet Take 200 mg by mouth as needed.  Marland Kitchen levothyroxine (SYNTHROID, LEVOTHROID) 125 MCG tablet TAKE (1) TABLET BY MOUTH EACH MORNING , wait 1/2  Hour before eating breakfast or taking  other medications.  Marland Kitchen loratadine (CLARITIN) 10 MG tablet Take 10 mg by mouth daily as needed. For allergies  . metFORMIN (GLUCOPHAGE) 500  MG tablet Take 1 tablet (500 mg total) by mouth daily.  . Olmesartan-Amlodipine-HCTZ (TRIBENZOR) 40-5-25 MG TABS Take 1 tablet by mouth daily.    Marland Kitchen triamcinolone cream (KENALOG) 0.1 % 1 application as needed.   . [DISCONTINUED] levothyroxine (SYNTHROID, LEVOTHROID) 125 MCG tablet TAKE (1) TABLET BY MOUTH EACH MORNING.  . [DISCONTINUED] metFORMIN (GLUCOPHAGE) 500 MG tablet TAKE ONE TABLET BY MOUTH DAILY.   No facility-administered encounter medications on file as of 07/01/2015.   ALLERGIES: Allergies  Allergen Reactions  . Sulfonamide Derivatives Hives   VACCINATION STATUS:  There is no immunization history on file for this patient.  HPI 59 yr old female with medical hx as above.  She is here to f/u for history of papillary thyroid cancer and postsurgical hypothyroidism. - she underwent thyroidectomy for PTC in February 2014 and rTSH stimulated RAI thyroid remnant ablation on 09/05/2012. her pathology revealed multifocal 1.2, and 0.3 cms follicular variant PTC.  Her surveillance neck ultrasound is negative for any residual thyroid tissue x2.  Second surveillance study with rTSH WBS showed a "focus of abnormal tracer accumulation in mid chest at the midline highly suspicious for metastatic disease potentially to a mediastinal lymph node". She was sent for CT of thorax/neck with contrast which she underwent on 03/18/2015. This was  reported as unremarkable.  She is on LT4 125 mcg po qday.  She denies dysphagia, SOB, nor voice change. She denies any family hx of thyroid dysfunction nor cancer.    Review of Systems  Constitutional: no weight gain/loss, no fatigue, no subjective hyperthermia/hypothermia Eyes: no blurry vision, no xerophthalmia ENT: no sore throat, no nodules palpated in throat, no dysphagia/odynophagia, no hoarseness Cardiovascular: no CP/SOB/palpitations/leg swelling Respiratory: no cough/SOB Gastrointestinal: no N/V/D/C Musculoskeletal: no muscle/joint aches Skin:  no rashes Neurological: no tremors/numbness/tingling/dizziness Psychiatric: no depression/anxiety  Objective:    BP 151/87 mmHg  Pulse 73  Ht '5\' 7"'  (1.702 m)  Wt 228 lb (103.42 kg)  BMI 35.70 kg/m2  SpO2 99%  Wt Readings from Last 3 Encounters:  07/01/15 228 lb (103.42 kg)  06/30/14 218 lb (98.884 kg)  06/29/13 228 lb (103.42 kg)    Physical Exam  Constitutional: overweight, in NAD Eyes: PERRLA, EOMI, no exophthalmos ENT: moist mucous membranes,  status post thyroidectomy, no cervical lymphadenopathy Cardiovascular: RRR, No MRG Respiratory: CTA B Gastrointestinal: abdomen soft, NT, ND, BS+ Musculoskeletal: no deformities, strength intact in all 4 Skin: moist, warm, no rashes Neurological: no tremor with outstretched hands, DTR normal in all 4  Complete Blood Count (Most recent): Lab Results  Component Value Date   WBC 6.3 08/21/2012   HGB 10.0* 08/21/2012   HCT 29.9* 08/21/2012   MCV 81.7 08/21/2012   PLT 241 08/21/2012   Chemistry (most recent): Lab Results  Component Value Date   NA 137 08/21/2012   K 4.0 08/21/2012   CL 101 08/21/2012   CO2 30 08/21/2012   BUN 20 08/21/2012   CREATININE 1.25* 08/21/2012     Assessment & Plan:   1. Malignant neoplasm of thyroid gland (HCC)  - She has had multifocal FVPTC ( 1.2 and 0.3 cms), stage 1 , with no evidence of lymphovascular invasion. She is s/p total thyroidectomy.  She is s/p rTSH stimulated RAI remnant ablation with post therapy scan which is negative for distant spread on 09/05/2012. Her stimulated TG level was 1.9 along with <20 Tg Abs.  her surveillance thyroid u/s is negative for any residual thyroid tissue x 1. Her last neck/ thyroid ultrasound is s/f surgically absent thyroid .  2) Second surveillance study with rTSH WBS showed a "focus of abnormal tracer accumulation in mid chest at the midline highly suspicious for metastatic disease potentially to a mediastinal lymph node". - CT with contrast of her  chest and neck were unremarkable. She will be considered for Thyrogen stimulated whole-body scan in 6 month.  -She is made aware of the fact that she will need life long thyroid hormone replacement and follow up with imaging studies.   2. Postsurgical hypothyroidism - Post surgical hypothyroidism on suppressive therapy: Her most recent TFT  show suppressed TSH however her free T4 was not determined.  I  will continue Lt4 dose 125 mcg po qam.  - We discussed about correct intake of levothyroxine, at fasting, with water, separated by at least 30 minutes from breakfast, and separated by more than 4 hours from calcium, iron, multivitamins, acid reflux medications (PPIs). -Patient is made aware of the fact that thyroid hormone replacement is needed for life, dose to be adjusted by periodic monitoring of thyroid function tests.    3. Essential hypertension, benign - Uncontrolled. I advised her to be more consistent and take her blood pressure medications in the morning.  4. Pre-diabetes -She is tolerating low dose MTF 581m po qday,  will continue. Her recent a1c was  6.2 %. Extensive dietary advice for weight loss was given , exercise is recommended    - 25 minutes of time was spent on the care of this patient , 50% of which was applied for counseling on diabetes complications and their preventions.  - I advised patient to maintain close follow up with Purvis Kilts, MD for primary care needs. Follow up plan: Return in about 5 months (around 11/29/2015) for underactive thyroid, high blood pressure, prediabetes, follow up with pre-visit labs.  Glade Lloyd, MD Phone: [phone]  Fax: [phone]   07/01/2015, 1:49 PM

## 2015-07-06 ENCOUNTER — Encounter: Payer: Self-pay | Admitting: Advanced Practice Midwife

## 2015-07-06 ENCOUNTER — Ambulatory Visit (INDEPENDENT_AMBULATORY_CARE_PROVIDER_SITE_OTHER): Payer: BLUE CROSS/BLUE SHIELD | Admitting: Advanced Practice Midwife

## 2015-07-06 VITALS — BP 124/64 | HR 109 | Ht 67.0 in | Wt 228.0 lb

## 2015-07-06 DIAGNOSIS — Z01419 Encounter for gynecological examination (general) (routine) without abnormal findings: Secondary | ICD-10-CM

## 2015-07-06 DIAGNOSIS — M545 Low back pain, unspecified: Secondary | ICD-10-CM

## 2015-07-06 LAB — POCT URINALYSIS DIPSTICK
Blood, UA: NEGATIVE
Glucose, UA: NEGATIVE
Ketones, UA: NEGATIVE
LEUKOCYTES UA: NEGATIVE
NITRITE UA: NEGATIVE
PROTEIN UA: NEGATIVE

## 2015-07-06 MED ORDER — LIDOCAINE 5 % EX PTCH: 1.0000 | MEDICATED_PATCH | CUTANEOUS | Status: DC

## 2015-07-06 NOTE — Progress Notes (Signed)
Geoffery Lyons 59 y.o.  Filed Vitals:   07/06/15 0858  BP: 124/64  Pulse: 109     Past Medical History: Past Medical History  Diagnosis Date  . Hypertension   . H/O seasonal allergies   . Anemia   . GERD (gastroesophageal reflux disease)   . Cancer [facility])     thyroid cancer  . Collagen vascular disease ([place])   . Thyroid disease     cancer  . Vitiligo   . Herpes simplex without mention of complication     rt thigh  . Eczema     Past Surgical History: Past Surgical History  Procedure Laterality Date  . Abdominal hysterectomy    . Colonoscopy  03/10/2012    Procedure: COLONOSCOPY;  Surgeon: Danie Binder, MD;  Location: [facility];  Service: Endoscopy;  Laterality: N/A;  10:30 AM  . Thyroidectomy  08/20/2012    Procedure: THYROIDECTOMY;  Surgeon: Jamesetta So, MD;  Location: [facility] ORS;  Service: General;  Laterality: N/A;  Total Thyroidectomy    Family History: Family History  Problem Relation Age of Onset  . Colon cancer Neg Hx   . Cancer Mother     lung   . Other Daughter     blood clot on lung    Social History: Social History  Substance Use Topics  . Smoking status: Never Smoker   . Smokeless tobacco: Never Used  . Alcohol Use: No    Allergies:  Allergies  Allergen Reactions  . Sulfonamide Derivatives Hives      Current outpatient prescriptions:  .  calcium-vitamin D (OSCAL 500/200 D-3) 500-200 MG-UNIT per tablet, Take 1 tablet by mouth 2 (two) times daily., Disp: , Rfl:  .  fluticasone (FLONASE) 50 MCG/ACT nasal spray, Place 2 sprays into the nose daily as needed. For allergies, Disp: , Rfl:  .  ibuprofen (ADVIL,MOTRIN) 200 MG tablet, Take 200 mg by mouth as needed., Disp: , Rfl:  .  levothyroxine (SYNTHROID, LEVOTHROID) 125 MCG tablet, TAKE (1) TABLET BY MOUTH EACH MORNING , wait 1/2  Hour before eating breakfast or taking  other medications., Disp: 30 tablet, Rfl: 6 .  loratadine (CLARITIN) 10 MG tablet, Take 10 mg by mouth daily as needed.  For allergies, Disp: , Rfl:  .  metFORMIN (GLUCOPHAGE) 500 MG tablet, Take 1 tablet (500 mg total) by mouth daily., Disp: 30 tablet, Rfl: 6 .  Olmesartan-Amlodipine-HCTZ (TRIBENZOR) 40-5-25 MG TABS, Take 1 tablet by mouth daily.  , Disp: , Rfl:  .  triamcinolone cream (KENALOG) 0.1 %, 1 application as needed. , Disp: , Rfl:  .  lidocaine (LIDODERM) 5 %, Place 1 patch onto the skin daily. Remove & Discard patch within 12 hours or as directed by MD, Disp: 30 patch, Rfl: 3  History of Present Illness: Here for annual GYN exam.   Had [place] for fibroids, still has one of her ovaries.  Sees PCP yearly, is current on all screening tests.  Colonsocopy 2013, Mammogram 11/16 (normal).  [place] labs by Dr Hilma Favors.  Has vaginal dryness, which she treats with lubricant during intercourse  IF she doesn't use enough, she may see some spotting.  Has minor LBP, sometimes shoots to buttocks.    Review of Systems   Patient denies any headaches, blurred vision, shortness of breath, chest pain, abdominal pain, problems with bowel movements, urination, or intercourse (except dryness)   Physical Exam: General:  Well developed, well nourished, no acute distress Skin:  Warm and  dry Neck:  Midline trachea, normal thyroid Lungs; Clear to auscultation bilaterally Breast:  No dominant palpable mass, retraction, or nipple discharge Cardiovascular: Regular rate and rhythm Abdomen:  Soft, non tender, no hepatosplenomegaly Pelvic:  External genitalia has vitilgo.  No lesions  The vagina is normal in appearance.   No adnexal masses or tenderness noted.  Extremities:  Mild swelling, stable per pt, no varicosities noted Psych:  No mood changes.     Impression: Normal GYN exam     Plan: Rx Lidoderm patch for LBP; samples of replens vaginal moisturizer and lubricant given.

## 2015-10-14 LAB — HM DIABETES EYE EXAM

## 2015-11-15 ENCOUNTER — Encounter: Payer: Self-pay | Admitting: "Endocrinology

## 2015-11-23 ENCOUNTER — Other Ambulatory Visit: Payer: Self-pay | Admitting: "Endocrinology

## 2015-11-24 LAB — T4, FREE: Free T4: 1.8 ng/dL (ref 0.8–1.8)

## 2015-11-24 LAB — HEMOGLOBIN A1C
Hgb A1c MFr Bld: 6.2 % — ABNORMAL HIGH (ref <5.7)
Mean Plasma Glucose: 131 mg/dL

## 2015-11-24 LAB — TSH: TSH: 0.06 m[IU]/L — AB

## 2015-11-30 ENCOUNTER — Ambulatory Visit (INDEPENDENT_AMBULATORY_CARE_PROVIDER_SITE_OTHER): Payer: BLUE CROSS/BLUE SHIELD | Admitting: "Endocrinology

## 2015-11-30 ENCOUNTER — Encounter: Payer: Self-pay | Admitting: "Endocrinology

## 2015-11-30 VITALS — BP 133/88 | HR 71 | Ht 67.0 in | Wt 224.0 lb

## 2015-11-30 DIAGNOSIS — C73 Malignant neoplasm of thyroid gland: Secondary | ICD-10-CM | POA: Diagnosis not present

## 2015-11-30 DIAGNOSIS — E890 Postprocedural hypothyroidism: Secondary | ICD-10-CM | POA: Diagnosis not present

## 2015-11-30 DIAGNOSIS — R7303 Prediabetes: Secondary | ICD-10-CM | POA: Diagnosis not present

## 2015-11-30 NOTE — Progress Notes (Signed)
Subjective:    Patient ID: Sophia Wilson, female    DOB: Sep 09, 1955, PCP Purvis Kilts, MD   Past Medical History  Diagnosis Date  . Hypertension   . H/O seasonal allergies   . Anemia   . GERD (gastroesophageal reflux disease)   . Cancer [facility])     thyroid cancer  . Collagen vascular disease ([place])   . Thyroid disease     cancer  . Vitiligo   . Herpes simplex without mention of complication     rt thigh  . Eczema    Past Surgical History  Procedure Laterality Date  . Abdominal hysterectomy    . Colonoscopy  03/10/2012    Procedure: COLONOSCOPY;  Surgeon: Danie Binder, MD;  [facility];  Service: Endoscopy;  Laterality: N/A;  10:30 AM  . Thyroidectomy  08/20/2012    Procedure: THYROIDECTOMY;  Surgeon: Jamesetta So, MD;  Location: [facility] ORS;  Service: General;  Laterality: N/A;  Total Thyroidectomy   Social History   Social History  . Marital Status: Married    Spouse Name: N/A  . Number of Children: N/A  . Years of Education: N/A   Social History Main Topics  . Smoking status: Never Smoker   . Smokeless tobacco: Never Used  . Alcohol Use: No  . Drug Use: No  . Sexual Activity: Yes    Birth Control/ Protection: Surgical     Comment: hysterectomy   Other Topics Concern  . None   Social History Narrative   Outpatient Encounter Prescriptions as of 11/30/2015  Medication Sig  . calcium-vitamin D (OSCAL 500/200 D-3) 500-200 MG-UNIT per tablet Take 1 tablet by mouth 2 (two) times daily.  . fluticasone (FLONASE) 50 MCG/ACT nasal spray Place 2 sprays into the nose daily as needed. For allergies  . ibuprofen (ADVIL,MOTRIN) 200 MG tablet Take 200 mg by mouth as needed.  Marland Kitchen levothyroxine (SYNTHROID, LEVOTHROID) 125 MCG tablet TAKE (1) TABLET BY MOUTH EACH MORNING , wait 1/2  Hour before eating breakfast or taking  other medications.  . lidocaine (LIDODERM) 5 % Place 1 patch onto the skin daily. Remove & Discard patch within 12 hours or as directed by MD   . loratadine (CLARITIN) 10 MG tablet Take 10 mg by mouth daily as needed. For allergies  . metFORMIN (GLUCOPHAGE) 500 MG tablet Take 1 tablet (500 mg total) by mouth daily.  . Olmesartan-Amlodipine-HCTZ (TRIBENZOR) 40-5-25 MG TABS Take 1 tablet by mouth daily.    Marland Kitchen triamcinolone cream (KENALOG) 0.1 % 1 application as needed.    No facility-administered encounter medications on file as of 11/30/2015.   ALLERGIES: Allergies  Allergen Reactions  . Sulfonamide Derivatives Hives   VACCINATION STATUS:  There is no immunization history on file for this patient.  HPI 60 yr old female with medical hx as above.  She is here to f/u for history of papillary thyroid cancer and postsurgical hypothyroidism. - she underwent thyroidectomy for PTC in February 2014 and rTSH stimulated RAI thyroid remnant ablation on 09/05/2012. her pathology revealed multifocal 1.2, and 0.3 cms follicular variant PTC.  Her surveillance neck ultrasound is negative for any residual thyroid tissue x2.  Second surveillance study with rTSH WBS showed a "focus of abnormal tracer accumulation in mid chest at the midline highly suspicious for metastatic disease potentially to a mediastinal lymph node". She was sent for CT of thorax/neck with contrast which she underwent on 03/18/2015. This was reported as unremarkable.  She  is on LT4 125 mcg po qday.  She denies dysphagia, SOB, nor voice change. She denies any family hx of thyroid dysfunction nor cancer.    Review of Systems  Constitutional: no weight gain/loss, no fatigue, no subjective hyperthermia/hypothermia Eyes: no blurry vision, no xerophthalmia ENT: no sore throat, no nodules palpated in throat, no dysphagia/odynophagia, no hoarseness Cardiovascular: no CP/SOB/palpitations/leg swelling Respiratory: no cough/SOB Gastrointestinal: no N/V/D/C Musculoskeletal: no muscle/joint aches Skin: no rashes Neurological: no tremors/numbness/tingling/dizziness Psychiatric: no  depression/anxiety  Objective:    BP 133/88 mmHg  Pulse 71  Ht '5\' 7"'  (1.702 m)  Wt 224 lb (101.606 kg)  BMI 35.08 kg/m2  SpO2 96%  Wt Readings from Last 3 Encounters:  11/30/15 224 lb (101.606 kg)  07/06/15 228 lb (103.42 kg)  07/01/15 228 lb (103.42 kg)    Physical Exam  Constitutional: overweight, in NAD Eyes: PERRLA, EOMI, no exophthalmos ENT: moist mucous membranes,  status post thyroidectomy, no cervical lymphadenopathy Cardiovascular: RRR, No MRG Respiratory: CTA B Gastrointestinal: abdomen soft, NT, ND, BS+ Musculoskeletal: no deformities, strength intact in all 4 Skin: moist, warm, no rashes Neurological: no tremor with outstretched hands, DTR normal in all 4  Complete Blood Count (Most recent): Lab Results  Component Value Date   WBC 6.3 08/21/2012   HGB 10.0* 08/21/2012   HCT 29.9* 08/21/2012   MCV 81.7 08/21/2012   PLT 241 08/21/2012   Chemistry (most recent): Lab Results  Component Value Date   NA 137 08/21/2012   K 4.0 08/21/2012   CL 101 08/21/2012   CO2 30 08/21/2012   BUN 20 08/21/2012   CREATININE 1.25* 08/21/2012     Assessment & Plan:   1. Malignant neoplasm of thyroid gland (HCC)  - She has had multifocal FVPTC ( 1.2 and 0.3 cms), stage 1 , with no evidence of lymphovascular invasion. She is s/p total thyroidectomy.  She is s/p rTSH stimulated RAI remnant ablation with post therapy scan which is negative for distant spread on 09/05/2012. Her stimulated TG level was 1.9 along with <20 Tg Abs.  her surveillance thyroid u/s is negative for any residual thyroid tissue x 1. Her last neck/ thyroid ultrasound is s/f surgically absent thyroid .  2) Second surveillance study with rTSH WBS showed a "focus of abnormal tracer accumulation in mid chest at the midline highly suspicious for metastatic disease potentially to a mediastinal lymph node". - CT with contrast of her chest and neck were unremarkable. She will be  Schedule for  Thyrogen  stimulated whole-body scan. - I will obtain thyroglobulin level and antithyroglobulin antibodies on the day of scan.  -She is made aware of the fact that she will need life long thyroid hormone replacement and follow up with imaging studies.   2. Postsurgical hypothyroidism - Post surgical hypothyroidism on suppressive therapy: Her most recent TFT  show suppressed TSH however her free T4 was not determined.  I  will continue Lt4 dose 125 mcg po qam.  - We discussed about correct intake of levothyroxine, at fasting, with water, separated by at least 30 minutes from breakfast, and separated by more than 4 hours from calcium, iron, multivitamins, acid reflux medications (PPIs). -Patient is made aware of the fact that thyroid hormone replacement is needed for life, dose to be adjusted by periodic monitoring of thyroid function tests.    3. Essential hypertension, benign - Controlled. I advised her to continue to take her blood pressure medications in the morning.  4. Pre-diabetes -She is tolerating  low dose MTF 537m po qday, will continue. Her recent a1c was  6.2 %. Extensive dietary advice for weight loss was given , exercise is recommended    - 25 minutes of time was spent on the care of this patient , 50% of which was applied for counseling on diabetes complications and their preventions.  - I advised patient to maintain close follow up with GPurvis Kilts MD for primary care needs. Follow up plan: Return in about 2 weeks (around 12/14/2015) for thyroid cancer, prediabetes, underactive thyroid.  GGlade Lloyd MD Phone: [phone] Fax: [phone]  11/30/2015, 11:26 AM

## 2015-12-05 ENCOUNTER — Encounter (HOSPITAL_COMMUNITY): Payer: Self-pay

## 2015-12-05 ENCOUNTER — Encounter (HOSPITAL_COMMUNITY)
Admission: RE | Admit: 2015-12-05 | Discharge: 2015-12-05 | Disposition: A | Discharge status: Home / Self Care | Payer: BLUE CROSS/BLUE SHIELD | Source: Ambulatory Visit | Attending: "Endocrinology | Admitting: "Endocrinology

## 2015-12-05 DIAGNOSIS — C73 Malignant neoplasm of thyroid gland: Secondary | ICD-10-CM | POA: Insufficient documentation

## 2015-12-05 MED ORDER — THYROTROPIN ALFA 1.1 MG IM SOLR
0.9000 mg | INTRAMUSCULAR | Status: AC
  Administered 2015-12-05: 0.9 mg via INTRAMUSCULAR

## 2015-12-05 MED ORDER — STERILE WATER FOR INJECTION IJ SOLN
INTRAMUSCULAR | Status: AC
  Administered 2015-12-05: 1 mL
  Filled 2015-12-05: qty 10

## 2015-12-05 MED ORDER — THYROTROPIN ALFA 1.1 MG IM SOLR
INTRAMUSCULAR | Status: AC
  Administered 2015-12-05: 0.9 mg via INTRAMUSCULAR
  Filled 2015-12-05: qty 0.9

## 2015-12-06 ENCOUNTER — Encounter (HOSPITAL_COMMUNITY)
Admission: RE | Admit: 2015-12-06 | Discharge: 2015-12-06 | Disposition: A | Discharge status: Home / Self Care | Payer: BLUE CROSS/BLUE SHIELD | Source: Ambulatory Visit | Attending: "Endocrinology | Admitting: "Endocrinology

## 2015-12-06 DIAGNOSIS — C73 Malignant neoplasm of thyroid gland: Secondary | ICD-10-CM | POA: Diagnosis not present

## 2015-12-06 MED ORDER — THYROTROPIN ALFA 1.1 MG IM SOLR
0.9000 mg | INTRAMUSCULAR | Status: AC
  Administered 2015-12-06: 0.9 mg via INTRAMUSCULAR

## 2015-12-06 MED ORDER — STERILE WATER FOR INJECTION IJ SOLN
INTRAMUSCULAR | Status: AC
  Administered 2015-12-06: 5 mL
  Filled 2015-12-06: qty 10

## 2015-12-06 MED ORDER — THYROTROPIN ALFA 1.1 MG IM SOLR
INTRAMUSCULAR | Status: AC
  Administered 2015-12-06: 0.9 mg via INTRAMUSCULAR
  Filled 2015-12-06: qty 0.9

## 2015-12-07 ENCOUNTER — Encounter (HOSPITAL_COMMUNITY)
Admission: RE | Admit: 2015-12-07 | Discharge: 2015-12-07 | Disposition: A | Discharge status: Home / Self Care | Payer: BLUE CROSS/BLUE SHIELD | Source: Ambulatory Visit | Attending: "Endocrinology | Admitting: "Endocrinology

## 2015-12-07 ENCOUNTER — Encounter (HOSPITAL_COMMUNITY): Payer: Self-pay

## 2015-12-07 MED ORDER — SODIUM IODIDE I 131 CAPSULE
4.0000 | Freq: Once | INTRAVENOUS | Status: AC | PRN
  Administered 2015-12-07: 3.98 via ORAL

## 2015-12-08 LAB — THYROGLOBULIN ANTIBODY

## 2015-12-08 LAB — THYROGLOBULIN LEVEL: Thyroglobulin: 0.1 ng/mL — ABNORMAL LOW (ref 2.8–40.9)

## 2015-12-09 ENCOUNTER — Encounter (HOSPITAL_COMMUNITY)
Admission: RE | Admit: 2015-12-09 | Discharge: 2015-12-09 | Disposition: A | Discharge status: Home / Self Care | Payer: BLUE CROSS/BLUE SHIELD | Source: Ambulatory Visit | Attending: "Endocrinology | Admitting: "Endocrinology

## 2015-12-09 ENCOUNTER — Encounter (HOSPITAL_COMMUNITY): Payer: Self-pay

## 2015-12-09 DIAGNOSIS — C73 Malignant neoplasm of thyroid gland: Secondary | ICD-10-CM | POA: Diagnosis not present

## 2015-12-15 ENCOUNTER — Encounter: Payer: Self-pay | Admitting: "Endocrinology

## 2015-12-15 ENCOUNTER — Ambulatory Visit (INDEPENDENT_AMBULATORY_CARE_PROVIDER_SITE_OTHER): Payer: BLUE CROSS/BLUE SHIELD | Admitting: "Endocrinology

## 2015-12-15 VITALS — BP 139/88 | HR 72 | Ht 67.0 in | Wt 225.0 lb

## 2015-12-15 DIAGNOSIS — C73 Malignant neoplasm of thyroid gland: Secondary | ICD-10-CM

## 2015-12-15 DIAGNOSIS — I10 Essential (primary) hypertension: Secondary | ICD-10-CM

## 2015-12-15 DIAGNOSIS — R7303 Prediabetes: Secondary | ICD-10-CM | POA: Diagnosis not present

## 2015-12-15 DIAGNOSIS — E890 Postprocedural hypothyroidism: Secondary | ICD-10-CM

## 2015-12-15 NOTE — Progress Notes (Signed)
Subjective:    Patient ID: Sophia Wilson, female    DOB: 1956/01/08, PCP Sophia Kilts, MD   Past Medical History  Diagnosis Date  . Hypertension   . H/O seasonal allergies   . Anemia   . GERD (gastroesophageal reflux disease)   . Cancer [facility])     thyroid cancer  . Collagen vascular disease ([place])   . Thyroid disease     cancer  . Vitiligo   . Herpes simplex without mention of complication     rt thigh  . Eczema    Past Surgical History  Procedure Laterality Date  . Abdominal hysterectomy    . Colonoscopy  03/10/2012    Procedure: COLONOSCOPY;  Surgeon: Danie Binder, MD;  [facility];  Service: Endoscopy;  Laterality: N/A;  10:30 AM  . Thyroidectomy  08/20/2012    Procedure: THYROIDECTOMY;  Surgeon: Jamesetta So, MD;  Location: [facility] ORS;  Service: General;  Laterality: N/A;  Total Thyroidectomy   Social History   Social History  . Marital Status: Married    Spouse Name: N/A  . Number of Children: N/A  . Years of Education: N/A   Social History Main Topics  . Smoking status: Never Smoker   . Smokeless tobacco: Never Used  . Alcohol Use: No  . Drug Use: No  . Sexual Activity: Yes    Birth Control/ Protection: Surgical     Comment: hysterectomy   Other Topics Concern  . None   Social History Narrative   Outpatient Encounter Prescriptions as of 12/15/2015  Medication Sig  . calcium-vitamin D (OSCAL 500/200 D-3) 500-200 MG-UNIT per tablet Take 1 tablet by mouth 2 (two) times daily.  . fluticasone (FLONASE) 50 MCG/ACT nasal spray Place 2 sprays into the nose daily as needed. For allergies  . ibuprofen (ADVIL,MOTRIN) 200 MG tablet Take 200 mg by mouth as needed.  Marland Kitchen levothyroxine (SYNTHROID, LEVOTHROID) 125 MCG tablet TAKE (1) TABLET BY MOUTH EACH MORNING , wait 1/2  Hour before eating breakfast or taking  other medications.  . lidocaine (LIDODERM) 5 % Place 1 patch onto the skin daily. Remove & Discard patch within 12 hours or as directed by MD   . loratadine (CLARITIN) 10 MG tablet Take 10 mg by mouth daily as needed. For allergies  . metFORMIN (GLUCOPHAGE) 500 MG tablet Take 1 tablet (500 mg total) by mouth daily.  . Olmesartan-Amlodipine-HCTZ (TRIBENZOR) 40-5-25 MG TABS Take 1 tablet by mouth daily.    Marland Kitchen triamcinolone cream (KENALOG) 0.1 % 1 application as needed.    No facility-administered encounter medications on file as of 12/15/2015.   ALLERGIES: Allergies  Allergen Reactions  . Sulfonamide Derivatives Hives   VACCINATION STATUS:  There is no immunization history on file for this patient.  HPI 61 yr old female with medical hx as above.  She is here to f/u for history of papillary thyroid cancer and postsurgical hypothyroidism. - she underwent thyroidectomy for PTC in February 2014 and rTSH stimulated RAI thyroid remnant ablation on 09/05/2012.  her pathology revealed multifocal 1.2, and 0.3 cms follicular variant PTC.  Her surveillance neck ultrasound is negative for any residual thyroid tissue x2.  Second surveillance study with rTSH WBS showed a "focus of abnormal tracer accumulation in mid chest at the midline highly suspicious for metastatic disease potentially to a mediastinal lymph node". She was sent for CT of thorax/neck with contrast which she underwent on 03/18/2015. This was reported as unremarkable.  -  Subsequent surveillance study with rTSH stimulation showed absence of the previously noticed abnormal tracer accumulation in the mid chest.  She is on LT4 125 mcg po qday.  She denies dysphagia, SOB, nor voice change. She denies any family hx of thyroid dysfunction nor cancer.    Review of Systems  Constitutional:  Steady weight , no fatigue, no subjective hyperthermia/hypothermia Eyes: no blurry vision, no xerophthalmia ENT: no sore throat, no nodules palpated in throat, no dysphagia/odynophagia, no hoarseness Cardiovascular: no CP/SOB/palpitations/leg swelling Respiratory: no  cough/SOB Gastrointestinal: no N/V/D/C Musculoskeletal: no muscle/joint aches Skin: no rashes Neurological: no tremors/numbness/tingling/dizziness Psychiatric: no depression/anxiety  Objective:    BP 139/88 mmHg  Pulse 72  Ht 5' 7" (1.702 m)  Wt 225 lb (102.059 kg)  BMI 35.23 kg/m2  SpO2 97%  Wt Readings from Last 3 Encounters:  12/15/15 225 lb (102.059 kg)  11/30/15 224 lb (101.606 kg)  07/06/15 228 lb (103.42 kg)    Physical Exam  Constitutional: overweight, in NAD Eyes: PERRLA, EOMI, no exophthalmos ENT: moist mucous membranes,  status post thyroidectomy, no cervical lymphadenopathy Cardiovascular: RRR, No MRG Respiratory: CTA B Gastrointestinal: abdomen soft, NT, ND, BS+ Musculoskeletal: no deformities, strength intact in all 4 Skin: moist, warm, no rashes Neurological: no tremor with outstretched hands, DTR normal in all 4  Complete Blood Count (Most recent): Lab Results  Component Value Date   WBC 6.3 08/21/2012   HGB 10.0* 08/21/2012   HCT 29.9* 08/21/2012   MCV 81.7 08/21/2012   PLT 241 08/21/2012   Chemistry (most recent): Lab Results  Component Value Date   NA 137 08/21/2012   K 4.0 08/21/2012   CL 101 08/21/2012   CO2 30 08/21/2012   BUN 20 08/21/2012   CREATININE 1.25* 08/21/2012    Results for Sophia Wilson, Sophia Wilson (MRN [id number]) as of 12/15/2015 11:17  Ref. Range 11/23/2015 09:50 12/07/2015 09:17 12/09/2015 10:28  TSH Latest Units: mIU/L 0.06 (L)    T4,Free(Direct) Latest Ref Range: 0.8-1.8 ng/dL 1.8    Thyroglobulin Latest Ref Range: 2.8-40.9 ng/mL  0.1 (L)   Thyroglobulin Ab Latest Ref Range: <2 IU/mL  <1      Assessment & Plan:   1. Malignant neoplasm of thyroid gland (HCC)  - She has had multifocal FVPTC ( 1.2 and 0.3 cms), stage 1 , with no evidence of lymphovascular invasion. She is s/p total thyroidectomy.  She is s/p rTSH stimulated RAI remnant ablation with post therapy scan which is negative for distant spread on 09/05/2012. Her  stimulated TG level was 1.9 along with <20 Tg Abs.  her surveillance thyroid u/s is negative for any residual thyroid tissue x 1. Her last neck/ thyroid ultrasound is s/f surgically absent thyroid .  Second surveillance study with rTSH WBS showed a "focus of abnormal tracer accumulation in mid chest at the midline highly suspicious for metastatic disease potentially to a mediastinal lymph node". - CT with contrast of her chest and neck were unremarkable. She will be  Schedule for  Thyrogen stimulated whole-body scan.   Her most recent  rTSH stimulated whole-body scan is reported as unremarkable,  Specifically, focus of abnormal uptake seen within the mid chest on the previous exam is no longer identified.  -  thyroglobulin level is 0.1 improving from 1.9 ,  with antithyroglobulin antibodies at <0.1.   2. Postsurgical hypothyroidism - Post surgical hypothyroidism on suppressive therapy: I  will continue Lt4 dose 125 mcg po qam.  - We discussed about correct intake of levothyroxine,  at fasting, with water, separated by at least 30 minutes from breakfast, and separated by more than 4 hours from calcium, iron, multivitamins, acid reflux medications (PPIs). -Patient is made aware of the fact that thyroid hormone replacement is needed for life, dose to be adjusted by periodic monitoring of thyroid function tests.   3.  hypertension - Controlled. I advised her to continue to take her blood pressure medications in the morning.  4. Pre-diabetes -She is tolerating low dose MTF 586m po qday, will continue. Her recent a1c was  6.2 %. Extensive dietary advice for weight loss was given , exercise is recommended    - 25 minutes of time was spent on the care of this patient , 50% of which was applied for counseling on diabetes complications and their preventions.  - I advised patient to maintain close follow up with GPurvis Kilts MD for primary care needs. Follow up plan: Return in about 3  months (around 03/16/2016) for follow up with pre-visit labs.  GGlade Lloyd MD Phone: [phone] Fax: [phone]  12/15/2015, 10:04 AM

## 2015-12-15 NOTE — Patient Instructions (Signed)

## 2016-01-30 ENCOUNTER — Other Ambulatory Visit: Payer: Self-pay | Admitting: "Endocrinology

## 2016-03-09 ENCOUNTER — Other Ambulatory Visit: Payer: Self-pay | Admitting: "Endocrinology

## 2016-03-09 LAB — T4, FREE: Free T4: 1.6 ng/dL (ref 0.8–1.8)

## 2016-03-09 LAB — TSH: TSH: 0.12 mIU/L — ABNORMAL LOW

## 2016-03-12 LAB — PTH, INTACT AND CALCIUM
CALCIUM: 9.7 mg/dL (ref 8.4–10.5)
PTH: 31 pg/mL (ref 14–64)

## 2016-03-16 ENCOUNTER — Encounter: Payer: Self-pay | Admitting: "Endocrinology

## 2016-03-16 ENCOUNTER — Ambulatory Visit (INDEPENDENT_AMBULATORY_CARE_PROVIDER_SITE_OTHER): Payer: BLUE CROSS/BLUE SHIELD | Admitting: "Endocrinology

## 2016-03-16 VITALS — BP 133/84 | HR 75 | Ht 67.0 in | Wt 224.0 lb

## 2016-03-16 DIAGNOSIS — R7303 Prediabetes: Secondary | ICD-10-CM | POA: Diagnosis not present

## 2016-03-16 DIAGNOSIS — C73 Malignant neoplasm of thyroid gland: Secondary | ICD-10-CM

## 2016-03-16 DIAGNOSIS — I10 Essential (primary) hypertension: Secondary | ICD-10-CM

## 2016-03-16 DIAGNOSIS — E890 Postprocedural hypothyroidism: Secondary | ICD-10-CM

## 2016-03-16 MED ORDER — METFORMIN HCL 500 MG PO TABS: 500.0000 mg | ORAL_TABLET | Freq: Every day | ORAL | 3 refills | Status: DC

## 2016-03-16 MED ORDER — LEVOTHYROXINE SODIUM 125 MCG PO TABS: ORAL_TABLET | ORAL | 3 refills | Status: DC

## 2016-03-16 NOTE — Progress Notes (Signed)
Subjective:    Patient ID: Sophia Wilson, female    DOB: [birth year], PCP Purvis Kilts, MD   Past Medical History:  Diagnosis Date  . Anemia   . Cancer [facility])    thyroid cancer  . Collagen vascular disease ([place])   . Eczema   . GERD (gastroesophageal reflux disease)   . H/O seasonal allergies   . Herpes simplex without mention of complication    rt thigh  . Hypertension   . Thyroid disease    cancer  . Vitiligo    Past Surgical History:  Procedure Laterality Date  . ABDOMINAL HYSTERECTOMY    . COLONOSCOPY  03/10/2012   Procedure: COLONOSCOPY;  Surgeon: Danie Binder, MD;  Location: [facility];  Service: Endoscopy;  Laterality: N/A;  10:30 AM  . THYROIDECTOMY  08/20/2012   Procedure: THYROIDECTOMY;  Surgeon: Jamesetta So, MD;  Location: [facility] ORS;  Service: General;  Laterality: N/A;  Total Thyroidectomy   Social History   Social History  . Marital status: Married    Spouse name: N/A  . Number of children: N/A  . Years of education: N/A   Social History Main Topics  . Smoking status: Never Smoker  . Smokeless tobacco: Never Used  . Alcohol use No  . Drug use: No  . Sexual activity: Yes    Birth control/ protection: Surgical     Comment: hysterectomy   Other Topics Concern  . None   Social History Narrative  . None   Outpatient Encounter Prescriptions as of 03/16/2016  Medication Sig  . aspirin EC 81 MG tablet Take 81 mg by mouth daily.  . calcium-vitamin D (OSCAL 500/200 D-3) 500-200 MG-UNIT per tablet Take 1 tablet by mouth 2 (two) times daily.  . fluticasone (FLONASE) 50 MCG/ACT nasal spray Place 2 sprays into the nose daily as needed. For allergies  . ibuprofen (ADVIL,MOTRIN) 200 MG tablet Take 200 mg by mouth as needed.  Marland Kitchen levothyroxine (SYNTHROID, LEVOTHROID) 125 MCG tablet TAKE (1) TABLET BY MOUTH EACH MORNING, WAIT 1/2 HOUR BEFORE EATING BREAKFAST OR TAKING OTHER MEDICATIONS.  Marland Kitchen lidocaine (LIDODERM) 5 % Place 1 patch onto the skin daily.  Remove & Discard patch within 12 hours or as directed by MD  . loratadine (CLARITIN) 10 MG tablet Take 10 mg by mouth daily as needed. For allergies  . meloxicam (MOBIC) 15 MG tablet Take 15 mg by mouth daily.  . metFORMIN (GLUCOPHAGE) 500 MG tablet Take 1 tablet (500 mg total) by mouth daily.  . Olmesartan-Amlodipine-HCTZ (TRIBENZOR) 40-5-25 MG TABS Take 1 tablet by mouth daily.    Marland Kitchen triamcinolone cream (KENALOG) 0.1 % 1 application as needed.   . [DISCONTINUED] levothyroxine (SYNTHROID, LEVOTHROID) 125 MCG tablet TAKE (1) TABLET BY MOUTH EACH MORNING, WAIT 1/2 HOUR BEFORE EATING BREAKFAST OR TAKING OTHER MEDICATIONS.  . [DISCONTINUED] metFORMIN (GLUCOPHAGE) 500 MG tablet TAKE ONE TABLET BY MOUTH DAILY.   No facility-administered encounter medications on file as of 03/16/2016.    ALLERGIES: Allergies  Allergen Reactions  . Sulfonamide Derivatives Hives   VACCINATION STATUS:  There is no immunization history on file for this patient.  HPI 60 yr old female with medical hx as above.  She is here to f/u for history of papillary thyroid cancer and postsurgical hypothyroidism. - she underwent thyroidectomy for PTC in February 2014 and rTSH stimulated RAI thyroid remnant ablation on 09/05/2012.  her pathology revealed multifocal 1.2, and 0.3 cms follicular variant PTC.  Her surveillance  neck ultrasound is negative for any residual thyroid tissue x2.  Second surveillance study with rTSH WBS showed a "focus of abnormal tracer accumulation in mid chest at the midline highly suspicious for metastatic disease potentially to a mediastinal lymph node". She was sent for CT of thorax/neck with contrast which she underwent on 03/18/2015. This was reported as unremarkable.  -Subsequent surveillance study with rTSH stimulation showed absence of the previously noticed abnormal tracer accumulation in the mid chest.  She is on LT4 125 mcg po qday. She is also on calcium supplement.  She denies dysphagia,  SOB, nor voice change. She denies any family hx of thyroid dysfunction nor cancer.     Review of Systems  Constitutional:  Steady weight , no fatigue, no subjective hyperthermia/hypothermia Eyes: no blurry vision, no xerophthalmia ENT: no sore throat, no nodules palpated in throat, no dysphagia/odynophagia, no hoarseness Cardiovascular: no CP/SOB/palpitations/leg swelling Respiratory: no cough/SOB Gastrointestinal: no N/V/D/C Musculoskeletal: no muscle/joint aches Skin: no rashes Neurological: no tremors/numbness/tingling/dizziness Psychiatric: no depression/anxiety  Objective:    BP 133/84   Pulse 75   Ht '5\' 7"'  (1.702 m)   Wt 224 lb (101.6 kg)   BMI 35.08 kg/m   Wt Readings from Last 3 Encounters:  03/16/16 224 lb (101.6 kg)  12/15/15 225 lb (102.1 kg)  11/30/15 224 lb (101.6 kg)    Physical Exam  Constitutional: overweight, in NAD Eyes: PERRLA, EOMI, no exophthalmos ENT: moist mucous membranes,  status post thyroidectomy, no cervical lymphadenopathy Cardiovascular: RRR, No MRG Respiratory: CTA B Gastrointestinal: abdomen soft, NT, ND, BS+ Musculoskeletal: no deformities, strength intact in all 4 Skin: moist, warm, no rashes Neurological: no tremor with outstretched hands, DTR normal in all 4  Complete Blood Count (Most recent): Lab Results  Component Value Date   WBC 6.3 08/21/2012   HGB 10.0 (L) 08/21/2012   HCT 29.9 (L) 08/21/2012   MCV 81.7 08/21/2012   PLT 241 08/21/2012   Chemistry (most recent): Lab Results  Component Value Date   NA 137 08/21/2012   K 4.0 08/21/2012   CL 101 08/21/2012   CO2 30 08/21/2012   BUN 20 08/21/2012   CREATININE 1.25 (H) 08/21/2012    Results for Sophia Wilson, Sophia Wilson (MRN [id number]) as of 03/16/2016 14:16  Ref. Range 11/23/2015 09:50 12/07/2015 09:17 12/09/2015 10:28 03/09/2016 09:46  Mean Plasma Glucose Latest Units: mg/dL 131     Calcium Latest Ref Range: 8.4 - 10.5 mg/dL    9.7  Hemoglobin A1C Latest Ref Range: <5.7 % 6.2  (H)     PTH Latest Ref Range: 14 - 64 pg/mL    31  TSH Latest Units: mIU/L 0.06 (L)   0.12 (L)  T4,Free(Direct) Latest Ref Range: 0.8 - 1.8 ng/dL 1.8   1.6  Thyroglobulin Latest Ref Range: 2.8 - 40.9 ng/mL  0.1 (L)    Thyroglobulin Ab Latest Ref Range: <2 IU/mL  <1      Assessment & Plan:   1. Malignant neoplasm of thyroid gland ([facility])  - She has had multifocal FVPTC ( 1.2 and 0.3 cms), stage 1 , with no evidence of lymphovascular invasion. She is s/p total thyroidectomy.  She is s/p rTSH stimulated RAI remnant ablation with post therapy scan which is negative for distant spread on 09/05/2012. Her stimulated TG level was 1.9 along with <20 Tg Abs.  her surveillance thyroid u/s is negative for any residual thyroid tissue x 1. Her last neck/ thyroid ultrasound is s/f surgically absent thyroid .  Second  surveillance study with rTSH WBS showed a "focus of abnormal tracer accumulation in mid chest at the midline highly suspicious for metastatic disease potentially to a mediastinal lymph node". - CT with contrast of her chest and neck were unremarkable.    Her most recent  [place] stimulated whole-body scan on 12/09/2015 is reported as unremarkable,  Specifically, focus of abnormal uptake seen within the mid chest on the previous exam is no longer identified.  -  thyroglobulin level is 0.1 improving from 1.9 ,  with antithyroglobulin antibodies at <0.1.  - She will have surveillance thyroid/neck ultrasound in 3 months.   2. Postsurgical hypothyroidism - Post surgical hypothyroidism on suppressive therapy: Her thyroid function tests are appropriate at this time. I  will continue Lt4 dose 125 mcg po qam.  - We discussed about correct intake of levothyroxine, at fasting, with water, separated by at least 30 minutes from breakfast, and separated by more than 4 hours from calcium, iron, multivitamins, acid reflux medications (PPIs). -Patient is made aware of the fact that thyroid hormone replacement  is needed for life, dose to be adjusted by periodic monitoring of thyroid function tests.   3.  hypertension - Controlled. I advised her to continue to take her blood pressure medications in the morning.  4. Pre-diabetes -She is tolerating low dose MTF 576m po qday, will continue. Her recent a1c was  6.2 %. Extensive dietary advice for weight loss was given , exercise is recommended   5) calcium: It appears that she is requiring long-term calcium supplement. Her PTH is 31 along with calcium of 9.7 on calcium carbonate 1250 mg by mouth twice a day. I advised her to continue on the same. - 25 minutes of time was spent on the care of this patient , 50% of which was applied for counseling on diabetes complications and their preventions.  - I advised patient to maintain close follow up with GPurvis Kilts MD for primary care needs. Follow up plan: Return in about 3 months (around 06/15/2016) for follow up with pre-visit labs, Thyroid / Neck Ultrasound.  GGlade Lloyd MD Phone: [phone] Fax: [phone]  03/16/2016, 2:16 PM

## 2016-04-03 ENCOUNTER — Ambulatory Visit (HOSPITAL_COMMUNITY)
Admission: RE | Admit: 2016-04-03 | Discharge: 2016-04-03 | Disposition: A | Discharge status: Home / Self Care | Payer: BLUE CROSS/BLUE SHIELD | Source: Ambulatory Visit | Attending: "Endocrinology | Admitting: "Endocrinology

## 2016-04-03 DIAGNOSIS — C73 Malignant neoplasm of thyroid gland: Secondary | ICD-10-CM | POA: Insufficient documentation

## 2016-05-02 ENCOUNTER — Other Ambulatory Visit (HOSPITAL_COMMUNITY): Payer: Self-pay | Admitting: Family Medicine

## 2016-05-02 DIAGNOSIS — Z1231 Encounter for screening mammogram for malignant neoplasm of breast: Secondary | ICD-10-CM

## 2016-05-03 ENCOUNTER — Other Ambulatory Visit: Payer: Self-pay

## 2016-05-03 DIAGNOSIS — Z1231 Encounter for screening mammogram for malignant neoplasm of breast: Secondary | ICD-10-CM

## 2016-05-12 ENCOUNTER — Other Ambulatory Visit: Payer: Self-pay | Admitting: Advanced Practice Midwife

## 2016-05-23 ENCOUNTER — Ambulatory Visit (HOSPITAL_COMMUNITY)
Admission: RE | Admit: 2016-05-23 | Discharge: 2016-05-23 | Disposition: A | Discharge status: Home / Self Care | Payer: BLUE CROSS/BLUE SHIELD | Source: Ambulatory Visit | Attending: Family Medicine | Admitting: Family Medicine

## 2016-05-23 DIAGNOSIS — Z1231 Encounter for screening mammogram for malignant neoplasm of breast: Secondary | ICD-10-CM | POA: Insufficient documentation

## 2016-06-11 ENCOUNTER — Other Ambulatory Visit: Payer: Self-pay | Admitting: "Endocrinology

## 2016-06-11 LAB — COMPLETE METABOLIC PANEL WITH GFR
ALBUMIN: 4.1 g/dL (ref 3.6–5.1)
ALK PHOS: 57 U/L (ref 33–130)
ALT: 11 U/L (ref 6–29)
AST: 19 U/L (ref 10–35)
BUN: 28 mg/dL — AB (ref 7–25)
CALCIUM: 9.5 mg/dL (ref 8.6–10.4)
CO2: 28 mmol/L (ref 20–31)
CREATININE: 0.89 mg/dL (ref 0.50–1.05)
Chloride: 102 mmol/L (ref 98–110)
GFR, Est African American: 82 mL/min (ref >=60)
GFR, Est Non African American: 71 mL/min (ref >=60)
Glucose, Bld: 88 mg/dL (ref 65–99)
Potassium: 4.7 mmol/L (ref 3.5–5.3)
Sodium: 137 mmol/L (ref 135–146)
Total Bilirubin: 0.4 mg/dL (ref 0.2–1.2)
Total Protein: 7.4 g/dL (ref 6.1–8.1)

## 2016-06-11 LAB — LIPID PANEL
CHOLESTEROL: 212 mg/dL — AB (ref <200)
HDL: 86 mg/dL (ref >50)
LDL Cholesterol: 113 mg/dL — ABNORMAL HIGH (ref <100)
Total CHOL/HDL Ratio: 2.5 Ratio (ref <5.0)
Triglycerides: 67 mg/dL (ref <150)
VLDL: 13 mg/dL (ref <30)

## 2016-06-11 LAB — TSH: TSH: 0.32 mIU/L — ABNORMAL LOW

## 2016-06-11 LAB — HEMOGLOBIN A1C
HEMOGLOBIN A1C: 5.8 % — AB (ref <5.7)
MEAN PLASMA GLUCOSE: 120 mg/dL

## 2016-06-11 LAB — T4, FREE: FREE T4: 1.7 ng/dL (ref 0.8–1.8)

## 2016-06-15 ENCOUNTER — Encounter: Payer: Self-pay | Admitting: "Endocrinology

## 2016-06-15 ENCOUNTER — Ambulatory Visit (INDEPENDENT_AMBULATORY_CARE_PROVIDER_SITE_OTHER): Payer: BLUE CROSS/BLUE SHIELD | Admitting: "Endocrinology

## 2016-06-15 VITALS — BP 112/74 | HR 84 | Ht 67.0 in | Wt 225.0 lb

## 2016-06-15 DIAGNOSIS — C73 Malignant neoplasm of thyroid gland: Secondary | ICD-10-CM

## 2016-06-15 DIAGNOSIS — R7303 Prediabetes: Secondary | ICD-10-CM | POA: Diagnosis not present

## 2016-06-15 DIAGNOSIS — I10 Essential (primary) hypertension: Secondary | ICD-10-CM

## 2016-06-15 DIAGNOSIS — E890 Postprocedural hypothyroidism: Secondary | ICD-10-CM

## 2016-06-15 MED ORDER — LEVOTHYROXINE SODIUM 125 MCG PO TABS: ORAL_TABLET | ORAL | 6 refills | Status: DC

## 2016-06-15 MED ORDER — METFORMIN HCL 500 MG PO TABS: 500.0000 mg | ORAL_TABLET | Freq: Every day | ORAL | 6 refills | Status: DC

## 2016-06-15 NOTE — Progress Notes (Signed)
Subjective:    Patient ID: Sophia Wilson, female    DOB: 1956/06/01, PCP Purvis Kilts, MD   Past Medical History:  Diagnosis Date  . Anemia   . Cancer [facility])    thyroid cancer  . Collagen vascular disease ([place])   . Eczema   . GERD (gastroesophageal reflux disease)   . H/O seasonal allergies   . Herpes simplex without mention of complication    rt thigh  . Hypertension   . Thyroid disease    cancer  . Vitiligo    Past Surgical History:  Procedure Laterality Date  . ABDOMINAL HYSTERECTOMY    . COLONOSCOPY  03/10/2012   Procedure: COLONOSCOPY;  Surgeon: Danie Binder, MD;  Location[facility];  Service: Endoscopy;  Laterality: N/A;  10:30 AM  . THYROIDECTOMY  08/20/2012   Procedure: THYROIDECTOMY;  Surgeon: Jamesetta So, MD;  Location: [facility] ORS;  Service: General;  Laterality: N/A;  Total Thyroidectomy   Social History   Social History  . Marital status: Married    Spouse name: N/A  . Number of children: N/A  . Years of education: N/A   Social History Main Topics  . Smoking status: Never Smoker  . Smokeless tobacco: Never Used  . Alcohol use No  . Drug use: No  . Sexual activity: Yes    Birth control/ protection: Surgical     Comment: hysterectomy   Other Topics Concern  . None   Social History Narrative  . None   Outpatient Encounter Prescriptions as of 06/15/2016  Medication Sig  . aspirin EC 81 MG tablet Take 81 mg by mouth daily.  . calcium-vitamin D (OSCAL 500/200 D-3) 500-200 MG-UNIT per tablet Take 1 tablet by mouth 2 (two) times daily.  . fluticasone (FLONASE) 50 MCG/ACT nasal spray Place 2 sprays into the nose daily as needed. For allergies  . ibuprofen (ADVIL,MOTRIN) 200 MG tablet Take 200 mg by mouth as needed.  Marland Kitchen levothyroxine (SYNTHROID, LEVOTHROID) 125 MCG tablet TAKE (1) TABLET BY MOUTH EACH MORNING, WAIT 1/2 HOUR BEFORE EATING BREAKFAST OR TAKING OTHER MEDICATIONS.  Marland Kitchen lidocaine (LIDODERM) 5 % PLACE 1 PATCH ONTO THE SKIN ONCE  DAILY. REMOVE USED PATCH AFTER 12 HOURS.  Marland Kitchen loratadine (CLARITIN) 10 MG tablet Take 10 mg by mouth daily as needed. For allergies  . meloxicam (MOBIC) 15 MG tablet Take 15 mg by mouth daily.  . metFORMIN (GLUCOPHAGE) 500 MG tablet Take 1 tablet (500 mg total) by mouth daily.  . Olmesartan-Amlodipine-HCTZ (TRIBENZOR) 40-5-25 MG TABS Take 1 tablet by mouth daily.    Marland Kitchen triamcinolone cream (KENALOG) 0.1 % 1 application as needed.   . [DISCONTINUED] levothyroxine (SYNTHROID, LEVOTHROID) 125 MCG tablet TAKE (1) TABLET BY MOUTH EACH MORNING, WAIT 1/2 HOUR BEFORE EATING BREAKFAST OR TAKING OTHER MEDICATIONS.  . [DISCONTINUED] metFORMIN (GLUCOPHAGE) 500 MG tablet Take 1 tablet (500 mg total) by mouth daily.   No facility-administered encounter medications on file as of 06/15/2016.    ALLERGIES: Allergies  Allergen Reactions  . Sulfonamide Derivatives Hives   VACCINATION STATUS:  There is no immunization history on file for this patient.  HPI 60 yr old female with medical hx as above.  She is here to f/u for history of papillary thyroid cancer and postsurgical hypothyroidism. - she underwent thyroidectomy for PTC in February 2014 and rTSH stimulated RAI thyroid remnant ablation on 09/05/2012.  her pathology revealed multifocal 1.2, and 0.3 cms follicular variant PTC.  Her surveillance neck ultrasound  is negative for any residual thyroid tissue x2.  Second surveillance study with rTSH WBS showed a "focus of abnormal tracer accumulation in mid chest at the midline highly suspicious for metastatic disease potentially to a mediastinal lymph node". She was sent for CT of thorax/neck with contrast which she underwent on 03/18/2015. This was reported as unremarkable.  -Subsequent surveillance study with rTSH stimulation showed absence of the previously noticed abnormal tracer accumulation in the mid chest.  She is on LT4 125 mcg po qday. She is also on calcium supplement.  She denies dysphagia, SOB, nor  voice change. She denies any family hx of thyroid dysfunction nor cancer.     Review of Systems  Constitutional:  Steady weight , no fatigue, no subjective hyperthermia/hypothermia Eyes: no blurry vision, no xerophthalmia ENT: no sore throat, no nodules palpated in throat, no dysphagia/odynophagia, no hoarseness Cardiovascular: no CP/SOB/palpitations/leg swelling Respiratory: no cough/SOB Gastrointestinal: no N/V/D/C Musculoskeletal: no muscle/joint aches Skin: no rashes Neurological: no tremors/numbness/tingling/dizziness Psychiatric: no depression/anxiety  Objective:    BP 112/74   Pulse 84   Ht _0  (1.702 m)   Wt 225 lb (102.1 kg)   BMI 35.24 kg/m   Wt Readings from Last 3 Encounters:  06/15/16 225 lb (102.1 kg)  03/16/16 224 lb (101.6 kg)  12/15/15 225 lb (102.1 kg)    Physical Exam  Constitutional: overweight, in NAD Eyes: PERRLA, EOMI, no exophthalmos ENT: moist mucous membranes,  status post thyroidectomy, no cervical lymphadenopathy Cardiovascular: RRR, No MRG Respiratory: CTA B Gastrointestinal: abdomen soft, NT, ND, BS+ Musculoskeletal: no deformities, strength intact in all 4 Skin: moist, warm, no rashes Neurological: no tremor with outstretched hands, DTR normal in all 4  Complete Blood Count (Most recent): Lab Results  Component Value Date   WBC 6.3 08/21/2012   HGB 10.0 (L) 08/21/2012   HCT 29.9 (L) 08/21/2012   MCV 81.7 08/21/2012   PLT 241 08/21/2012   Chemistry (most recent): Lab Results  Component Value Date   NA 137 06/11/2016   K 4.7 06/11/2016   CL 102 06/11/2016   CO2 28 06/11/2016   BUN 28 (H) 06/11/2016   CREATININE 0.89 06/11/2016    Results for LORRI, FUKUHARA (MRN [id number]) as of 06/15/2016 11:42  Ref. Range 11/23/2015 09:50 12/07/2015 09:17 03/09/2016 09:46 06/11/2016 09:30  TSH Latest Units: mIU/L 0.06 (L)  0.12 (L) 0.32 (L)  T4,Free(Direct) Latest Ref Range: 0.8 - 1.8 ng/dL 1.8  1.6 1.7  Thyroglobulin Latest Ref Range:  2.8 - 40.9 ng/mL  0.1 (L)    Thyroglobulin Ab Latest Ref Range: <2 IU/mL  <1      Assessment & Plan:   1. Malignant neoplasm of thyroid gland (HCC)  - She has had multifocal FVPTC ( 1.2 and 0.3 cms), stage 1 , with no evidence of lymphovascular invasion. She is s/p total thyroidectomy.  She is s/p rTSH stimulated RAI remnant ablation with post therapy scan which is negative for distant spread on 09/05/2012. Her stimulated TG level was 1.9 along with <20 Tg Abs.  her surveillance thyroid u/s is negative for any residual thyroid tissue x 1. Her last neck/ thyroid ultrasound is s/f surgically absent thyroid .  Second surveillance study with rTSH WBS on 02/18/2015 showed a "focus of abnormal tracer accumulation in mid chest at the midline highly suspicious for metastatic disease potentially to a mediastinal lymph node". - CT with contrast of her chest and neck were unremarkable.    Her most recent  rTSH stimulated  whole-body scan on 12/09/2015 is reported as unremarkable,  Specifically, focus of abnormal uptake seen within the mid chest on the previous exam is no longer identified.  -  thyroglobulin level is 0.1 improving from 1.9 ,  with antithyroglobulin antibodies at <0.1.  -  Her surveillance thyroid/neck ultrasound on 04/03/2016 was unremarkable,  showing surgically absent thyroid.   2. Postsurgical hypothyroidism - Post surgical hypothyroidism on suppressive therapy: Her thyroid function tests are appropriate at this time. I  will continue Lt4 dose 125 mcg po qam.  - We discussed about correct intake of levothyroxine, at fasting, with water, separated by at least 30 minutes from breakfast, and separated by more than 4 hours from calcium, iron, multivitamins, acid reflux medications (PPIs). -Patient is made aware of the fact that thyroid hormone replacement is needed for life, dose to be adjusted by periodic monitoring of thyroid function tests.   3.  hypertension - Controlled. I  advised her to continue to take her blood pressure medications in the morning.  4. Pre-diabetes -She is tolerating low dose MTF 518m po qday, will continue. Her recent a1c was  6.2 %. Extensive dietary advice for weight loss was given , exercise is recommended   5) calcium: It appears that she is requiring long-term calcium supplement. Her PTH is 31 along with calcium of 9.7 on calcium carbonate 1250 mg by mouth twice a day. I advised her to continue on the same. - 25 minutes of time was spent on the care of this patient , 50% of which was applied for counseling on diabetes complications and their preventions.  - I advised patient to maintain close follow up with GPurvis Kilts MD for primary care needs. Follow up plan: Return in about 6 months (around 12/14/2016) for follow up with pre-visit labs.  GGlade Lloyd MD Phone: [phone] Fax: [phone]  06/15/2016, 11:39 AM

## 2016-07-11 ENCOUNTER — Ambulatory Visit (INDEPENDENT_AMBULATORY_CARE_PROVIDER_SITE_OTHER): Payer: BLUE CROSS/BLUE SHIELD | Admitting: Adult Health

## 2016-07-11 ENCOUNTER — Encounter: Payer: Self-pay | Admitting: Adult Health

## 2016-07-11 VITALS — BP 122/72 | HR 78 | Ht 66.0 in | Wt 226.5 lb

## 2016-07-11 DIAGNOSIS — Z01419 Encounter for gynecological examination (general) (routine) without abnormal findings: Secondary | ICD-10-CM | POA: Diagnosis not present

## 2016-07-11 DIAGNOSIS — Z1212 Encounter for screening for malignant neoplasm of rectum: Secondary | ICD-10-CM | POA: Diagnosis not present

## 2016-07-11 DIAGNOSIS — Z1211 Encounter for screening for malignant neoplasm of colon: Secondary | ICD-10-CM

## 2016-07-11 LAB — HEMOCCULT GUIAC POC 1CARD (OFFICE): FECAL OCCULT BLD: NEGATIVE

## 2016-07-11 MED ORDER — LIDOCAINE 5 % EX PTCH: MEDICATED_PATCH | CUTANEOUS | 3 refills | Status: DC

## 2016-07-11 NOTE — Progress Notes (Signed)
Patient ID: Sophia Wilson, female   DOB: [birth year], 60 y.o.   MRN: [id number] History of Present Illness: Sophia Wilson is a 60 year old black female,married in for well woman gyn exam, she is sp hysterectomy. PCP is Dr Hilma Favors.   Current Medications, Allergies, Past Medical History, Past Surgical History, Family History and Social History were reviewed in [employer] record.     Review of Systems: Patient denies any headaches, hearing loss, fatigue, blurred vision, shortness of breath, chest pain, abdominal pain, problems with bowel movements, urination, or intercourse. No joint pain or mood swings.Has low back pain at times, and lidocaine patch helps, and uses replens as needed for vaginal moisture.    Physical Exam:BP 122/72 (BP Location: Left Arm, Patient Position: Sitting, Cuff Size: Large)   Pulse 78   Ht 5\' 6"  (1.676 m)   Wt 226 lb 8 oz (102.7 kg)   BMI 36.56 kg/m  General:  Well developed, well nourished, no acute distress Skin:  Warm and dry Neck:  Midline trachea, thyroid absent, good ROM, no lymphadenopathy Lungs; Clear to auscultation bilaterally Breast:  No dominant palpable mass, retraction, or nipple discharge Cardiovascular: Regular rate and rhythm Abdomen:  Soft, non tender, no hepatosplenomegaly Pelvic:  External genitalia is normal in appearance, no lesions.  The vagina is normal in appearance for age. Urethra has no lesions or masses. The cervix and uterus are absent.No adnexal masses or tenderness noted.Bladder is non tender, no masses felt. Rectal: Good sphincter tone, no polyps, or hemorrhoids felt.  Hemoccult negative. Extremities/musculoskeletal:  No swelling or varicosities noted, no clubbing or cyanosis Psych:  No mood changes, alert and cooperative,seems happy PHQ 2 score is 0.She requests refill on lidocaine patch for her back.  Impression: 1. Well woman exam with routine gynecological exam   2. Screening for colorectal cancer        Plan: Physical in 1 year Mammogram yearly Labs with PCP  Refilled lidocaine patch x 3

## 2016-07-11 NOTE — Patient Instructions (Signed)
Physical in 1 year Mammogram yearly Labs with PCP 

## 2016-12-06 ENCOUNTER — Other Ambulatory Visit: Payer: Self-pay | Admitting: "Endocrinology

## 2016-12-06 LAB — COMPREHENSIVE METABOLIC PANEL
ALT: 9 U/L (ref 6–29)
AST: 16 U/L (ref 10–35)
Albumin: 3.8 g/dL (ref 3.6–5.1)
Alkaline Phosphatase: 54 U/L (ref 33–130)
BUN: 23 mg/dL (ref 7–25)
CALCIUM: 9.6 mg/dL (ref 8.6–10.4)
CHLORIDE: 102 mmol/L (ref 98–110)
CO2: 30 mmol/L (ref 20–31)
CREATININE: 0.97 mg/dL (ref 0.50–0.99)
Glucose, Bld: 88 mg/dL (ref 65–99)
POTASSIUM: 4.7 mmol/L (ref 3.5–5.3)
SODIUM: 138 mmol/L (ref 135–146)
TOTAL PROTEIN: 6.9 g/dL (ref 6.1–8.1)
Total Bilirubin: 0.4 mg/dL (ref 0.2–1.2)

## 2016-12-06 LAB — T4, FREE: FREE T4: 1.5 ng/dL (ref 0.8–1.8)

## 2016-12-06 LAB — TSH: TSH: 0.09 m[IU]/L — AB

## 2016-12-07 LAB — HEMOGLOBIN A1C
HEMOGLOBIN A1C: 6 % — AB (ref <5.7)
Mean Plasma Glucose: 126 mg/dL

## 2016-12-14 ENCOUNTER — Encounter: Payer: Self-pay | Admitting: "Endocrinology

## 2016-12-14 ENCOUNTER — Ambulatory Visit (INDEPENDENT_AMBULATORY_CARE_PROVIDER_SITE_OTHER): Payer: BLUE CROSS/BLUE SHIELD | Admitting: "Endocrinology

## 2016-12-14 VITALS — BP 121/82 | HR 73 | Ht 66.0 in | Wt 239.0 lb

## 2016-12-14 DIAGNOSIS — C73 Malignant neoplasm of thyroid gland: Secondary | ICD-10-CM

## 2016-12-14 DIAGNOSIS — R7303 Prediabetes: Secondary | ICD-10-CM | POA: Diagnosis not present

## 2016-12-14 DIAGNOSIS — E890 Postprocedural hypothyroidism: Secondary | ICD-10-CM

## 2016-12-14 NOTE — Progress Notes (Signed)
Subjective:    Patient ID: Sophia Wilson, female    DOB: 02-15-1956, PCP Sophia Sites, MD   Past Medical History:  Diagnosis Date  . Anemia   . Cancer [facility])    thyroid cancer  . Collagen vascular disease ([place])   . Eczema   . GERD (gastroesophageal reflux disease)   . H/O seasonal allergies   . Herpes simplex without mention of complication    rt thigh  . Hypertension   . Thyroid disease    cancer  . Vitiligo    Past Surgical History:  Procedure Laterality Date  . ABDOMINAL HYSTERECTOMY    . COLONOSCOPY  03/10/2012   Procedure: COLONOSCOPY;  Surgeon: Sophia Binder, MD;  Location: [facility];  Service: Endoscopy;  Laterality: N/A;  10:30 AM  . THYROIDECTOMY  08/20/2012   Procedure: THYROIDECTOMY;  Surgeon: Sophia So, MD;  Location: [facility] ORS;  Service: General;  Laterality: N/A;  Total Thyroidectomy   Social History   Social History  . Marital status: Married    Spouse name: N/A  . Number of children: N/A  . Years of education: N/A   Social History Main Topics  . Smoking status: Never Smoker  . Smokeless tobacco: Never Used  . Alcohol use No  . Drug use: No  . Sexual activity: Yes    Birth control/ protection: Surgical     Comment: hysterectomy   Other Topics Concern  . None   Social History Narrative  . None   Outpatient Encounter Prescriptions as of 12/14/2016  Medication Sig  . aspirin EC 81 MG tablet Take 81 mg by mouth daily.  . calcium-vitamin D (OSCAL 500/200 D-3) 500-200 MG-UNIT per tablet Take 1 tablet by mouth 2 (two) times daily. (Patient taking differently: Take 1 tablet by mouth daily. )  . fluticasone (FLONASE) 50 MCG/ACT nasal spray Place 2 sprays into the nose daily as needed. For allergies  . ibuprofen (ADVIL,MOTRIN) 200 MG tablet Take 200 mg by mouth as needed.  Marland Kitchen levothyroxine (SYNTHROID, LEVOTHROID) 125 MCG tablet TAKE (1) TABLET BY MOUTH EACH MORNING, WAIT 1/2 HOUR BEFORE EATING BREAKFAST OR TAKING OTHER MEDICATIONS.  Marland Kitchen lidocaine  (LIDODERM) 5 % PLACE 1 PATCH ONTO THE SKIN ONCE DAILY. REMOVE USED PATCH AFTER 12 HOURS.  Marland Kitchen loratadine (CLARITIN) 10 MG tablet Take 10 mg by mouth daily as needed. For allergies  . meloxicam (MOBIC) 15 MG tablet Take 15 mg by mouth daily.  . metFORMIN (GLUCOPHAGE) 500 MG tablet Take 1 tablet (500 mg total) by mouth daily.  . Olmesartan-Amlodipine-HCTZ (TRIBENZOR) 40-5-25 MG TABS Take 1 tablet by mouth daily.    Marland Kitchen triamcinolone cream (KENALOG) 0.1 % 1 application as needed.    No facility-administered encounter medications on file as of 12/14/2016.    ALLERGIES: Allergies  Allergen Reactions  . Sulfonamide Derivatives Hives   VACCINATION STATUS:  There is no immunization history on file for this patient.  HPI 61 yr old female with medical hx as above.  She is here to f/u for history of papillary thyroid cancer and postsurgical hypothyroidism. - she underwent thyroidectomy for PTC in February 2014 and rTSH stimulated RAI thyroid remnant ablation on 09/05/2012.  her pathology revealed multifocal 1.2, and 0.3 cms follicular variant PTC.  Her surveillance neck ultrasound is negative for any residual thyroid tissue x2.  Second surveillance study with rTSH WBS showed a "focus of abnormal tracer accumulation in mid chest at the midline highly suspicious for metastatic disease potentially  to a mediastinal lymph node". She was sent for CT of thorax/neck with contrast which she underwent on 03/18/2015. This was reported as unremarkable.  -Subsequent surveillance study with rTSH stimulation showed absence of the previously noticed abnormal tracer accumulation in the mid chest.  She is on levothyroxine 125 mcg po qday. She is also on calcium supplement.  She denies dysphagia, SOB, nor voice change. She denies any family hx of thyroid dysfunction nor cancer.     Review of Systems  Constitutional:  Steady weight gain, no fatigue, no subjective hyperthermia/hypothermia Eyes: no blurry vision, no  xerophthalmia ENT: no sore throat, no nodules palpated in throat, no dysphagia/odynophagia, no hoarseness Cardiovascular: no CP/SOB/palpitations/leg swelling Respiratory: no cough/SOB Gastrointestinal: no N/V/D/C Musculoskeletal: no muscle/joint aches Skin: no rashes Neurological: no tremors/numbness/tingling/dizziness Psychiatric: no depression/anxiety  Objective:    BP 121/82   Pulse 73   Ht '5\' 6"'  (1.676 m)   Wt 239 lb (108.4 kg)   BMI 38.58 kg/m   Wt Readings from Last 3 Encounters:  12/14/16 239 lb (108.4 kg)  07/11/16 226 lb 8 oz (102.7 kg)  06/15/16 225 lb (102.1 kg)    Physical Exam  Constitutional: overweight, in NAD Eyes: PERRLA, EOMI, no exophthalmos ENT: moist mucous membranes,  status post thyroidectomy, no cervical lymphadenopathy Cardiovascular: RRR, No MRG Respiratory: CTA B Gastrointestinal: abdomen soft, NT, ND, BS+ Musculoskeletal: no deformities, strength intact in all 4 Skin: moist, warm, no rashes Neurological: no tremor with outstretched hands, DTR normal in all 4  Complete Blood Count (Most recent): Lab Results  Component Value Date   WBC 6.3 08/21/2012   HGB 10.0 (L) 08/21/2012   HCT 29.9 (L) 08/21/2012   MCV 81.7 08/21/2012   PLT 241 08/21/2012   Chemistry (most recent): Lab Results  Component Value Date   NA 138 12/06/2016   K 4.7 12/06/2016   CL 102 12/06/2016   CO2 30 12/06/2016   BUN 23 12/06/2016   CREATININE 0.97 12/06/2016    Results for Sophia Wilson (MRN [id number]) as of 12/14/2016 13:08  Ref. Range 03/09/2016 09:46 06/11/2016 09:30 12/06/2016 09:13  TSH Latest Units: mIU/L 0.12 (L) 0.32 (L) 0.09 (L)  T4,Free(Direct) Latest Ref Range: 0.8 - 1.8 ng/dL 1.6 1.7 1.5    Assessment & Plan:   1. Malignant neoplasm of thyroid gland ([facility])  - She has had multifocal FVPTC ( 1.2 and 0.3 cms), stage 1 , with no evidence of lymphovascular invasion. She is s/p total thyroidectomy.  She is s/p rTSH stimulated RAI remnant ablation  with post therapy scan which is negative for distant spread on 09/05/2012. Her stimulated TG level was 1.9 along with <20 Tg Abs.  her surveillance thyroid u/s is negative for any residual thyroid tissue x 1. Her last neck/ thyroid ultrasound is s/f surgically absent thyroid .  Second surveillance study with rTSH WBS on 02/18/2015 showed a "focus of abnormal tracer accumulation in mid chest at the midline highly suspicious for metastatic disease potentially to a mediastinal lymph node". - CT with contrast of her chest and neck were unremarkable.    Her most recent  Caban stimulated whole-body scan on 12/09/2015 is reported as unremarkable,  Specifically, focus of abnormal uptake seen within the mid chest on the previous exam is no longer identified.  -  thyroglobulin level is 0.1 improving from 1.9 ,  with antithyroglobulin antibodies at <0.1.  -  Her surveillance thyroid/neck ultrasound on 04/03/2016 was unremarkable,  showing surgically absent thyroid. - She will  have Thyrogen stimulated whole-body scan and thyroglobulin level before her next visit in 6 month.   2. Postsurgical hypothyroidism - Post surgical hypothyroidism on suppressive therapy: Her thyroid function tests are appropriate at this time. I  will continue levothyroxine at current dose of 125 mcg po qam.  - We discussed about correct intake of levothyroxine, at fasting, with water, separated by at least 30 minutes from breakfast, and separated by more than 4 hours from calcium, iron, multivitamins, acid reflux medications (PPIs). -Patient is made aware of the fact that thyroid hormone replacement is needed for life, dose to be adjusted by periodic monitoring of thyroid function tests.   3.  hypertension - Controlled. I advised her to continue to take her blood pressure medications in the morning.  4. Pre-diabetes -She is tolerating low dose MTF 510m po qday, will continue. Her recent a1c was  6 %. Extensive dietary advice for  weight loss was given , exercise is recommended   5) calcium: It appears that she is requiring long-term calcium supplement. Her PTH is 31 along with calcium of 9.7 on calcium carbonate 1250 mg by mouth twice a day. I advised her to continue on the same. - 25 minutes of time was spent on the care of this patient , 50% of which was applied for counseling on diabetes complications and their preventions.  - I advised patient to maintain close follow up with GSharilyn Sites MD for primary care needs. Follow up plan: Return in about 6 months (around 06/15/2017) for follow up with pre-visit labs, follow up with Whole Body Scan w/Thyrogen.  GGlade Lloyd MD Phone: [phone] Fax: [phone]  12/14/2016, 1:07 PM

## 2017-01-17 ENCOUNTER — Other Ambulatory Visit: Payer: Self-pay

## 2017-01-17 MED ORDER — METFORMIN HCL 500 MG PO TABS: 500.0000 mg | ORAL_TABLET | Freq: Every day | ORAL | 6 refills | Status: DC

## 2017-01-17 MED ORDER — LEVOTHYROXINE SODIUM 125 MCG PO TABS: ORAL_TABLET | ORAL | 6 refills | Status: DC

## 2017-04-19 ENCOUNTER — Other Ambulatory Visit (HOSPITAL_COMMUNITY): Payer: Self-pay | Admitting: Family Medicine

## 2017-04-19 DIAGNOSIS — Z1231 Encounter for screening mammogram for malignant neoplasm of breast: Secondary | ICD-10-CM

## 2017-05-02 ENCOUNTER — Ambulatory Visit (HOSPITAL_COMMUNITY)
Admission: RE | Admit: 2017-05-02 | Discharge: 2017-05-02 | Disposition: A | Discharge status: Home / Self Care | Payer: BLUE CROSS/BLUE SHIELD | Source: Ambulatory Visit | Attending: Family Medicine | Admitting: Family Medicine

## 2017-05-02 ENCOUNTER — Other Ambulatory Visit (HOSPITAL_COMMUNITY): Payer: Self-pay | Admitting: Family Medicine

## 2017-05-02 DIAGNOSIS — Z1389 Encounter for screening for other disorder: Secondary | ICD-10-CM | POA: Diagnosis not present

## 2017-05-02 DIAGNOSIS — I10 Essential (primary) hypertension: Secondary | ICD-10-CM | POA: Diagnosis present

## 2017-05-02 DIAGNOSIS — M47896 Other spondylosis, lumbar region: Secondary | ICD-10-CM | POA: Insufficient documentation

## 2017-05-02 DIAGNOSIS — M545 Low back pain: Secondary | ICD-10-CM

## 2017-05-02 DIAGNOSIS — M4186 Other forms of scoliosis, lumbar region: Secondary | ICD-10-CM | POA: Insufficient documentation

## 2017-05-24 ENCOUNTER — Ambulatory Visit (HOSPITAL_COMMUNITY)
Admission: RE | Admit: 2017-05-24 | Discharge: 2017-05-24 | Disposition: A | Discharge status: Home / Self Care | Payer: BLUE CROSS/BLUE SHIELD | Source: Ambulatory Visit | Attending: Family Medicine | Admitting: Family Medicine

## 2017-05-24 DIAGNOSIS — Z1231 Encounter for screening mammogram for malignant neoplasm of breast: Secondary | ICD-10-CM

## 2017-06-03 ENCOUNTER — Ambulatory Visit (HOSPITAL_COMMUNITY)
Admission: RE | Admit: 2017-06-03 | Discharge: 2017-06-03 | Disposition: A | Discharge status: Home / Self Care | Payer: BLUE CROSS/BLUE SHIELD | Source: Ambulatory Visit | Attending: Family Medicine | Admitting: Family Medicine

## 2017-06-03 ENCOUNTER — Encounter (HOSPITAL_COMMUNITY): Payer: Self-pay

## 2017-06-03 DIAGNOSIS — Z1231 Encounter for screening mammogram for malignant neoplasm of breast: Secondary | ICD-10-CM | POA: Diagnosis not present

## 2017-06-05 ENCOUNTER — Other Ambulatory Visit: Payer: Self-pay | Admitting: "Endocrinology

## 2017-06-05 DIAGNOSIS — C73 Malignant neoplasm of thyroid gland: Secondary | ICD-10-CM

## 2017-06-10 ENCOUNTER — Other Ambulatory Visit (HOSPITAL_COMMUNITY): Payer: BLUE CROSS/BLUE SHIELD

## 2017-06-10 ENCOUNTER — Encounter (HOSPITAL_COMMUNITY)
Admission: RE | Admit: 2017-06-10 | Discharge: 2017-06-10 | Disposition: A | Discharge status: Home / Self Care | Payer: BLUE CROSS/BLUE SHIELD | Source: Ambulatory Visit | Attending: "Endocrinology | Admitting: "Endocrinology

## 2017-06-10 DIAGNOSIS — C73 Malignant neoplasm of thyroid gland: Secondary | ICD-10-CM | POA: Insufficient documentation

## 2017-06-10 MED ORDER — THYROTROPIN ALFA 1.1 MG IM SOLR
INTRAMUSCULAR | Status: AC
  Administered 2017-06-10: 0.9 mg via INTRAMUSCULAR
  Filled 2017-06-10: qty 0.9

## 2017-06-10 MED ORDER — THYROTROPIN ALFA 1.1 MG IM SOLR
0.9000 mg | INTRAMUSCULAR | Status: AC
  Administered 2017-06-10: 0.9 mg via INTRAMUSCULAR

## 2017-06-10 MED ORDER — STERILE WATER FOR INJECTION IJ SOLN
INTRAMUSCULAR | Status: AC
  Administered 2017-06-10: 1 mL
  Filled 2017-06-10: qty 10

## 2017-06-11 ENCOUNTER — Other Ambulatory Visit (HOSPITAL_COMMUNITY): Payer: BLUE CROSS/BLUE SHIELD

## 2017-06-11 ENCOUNTER — Encounter (HOSPITAL_COMMUNITY)
Admission: RE | Admit: 2017-06-11 | Discharge: 2017-06-11 | Disposition: A | Discharge status: Home / Self Care | Payer: BLUE CROSS/BLUE SHIELD | Source: Ambulatory Visit | Attending: "Endocrinology | Admitting: "Endocrinology

## 2017-06-11 DIAGNOSIS — C73 Malignant neoplasm of thyroid gland: Secondary | ICD-10-CM | POA: Diagnosis not present

## 2017-06-11 MED ORDER — STERILE WATER FOR INJECTION IJ SOLN
INTRAMUSCULAR | Status: AC
Start: 2017-06-11 — End: 2017-06-11
  Filled 2017-06-11: qty 10

## 2017-06-11 MED ORDER — THYROTROPIN ALFA 1.1 MG IM SOLR
INTRAMUSCULAR | Status: AC
  Administered 2017-06-11: 0.9 mg
  Filled 2017-06-11: qty 0.9

## 2017-06-12 ENCOUNTER — Other Ambulatory Visit (HOSPITAL_COMMUNITY): Payer: BLUE CROSS/BLUE SHIELD

## 2017-06-12 ENCOUNTER — Encounter (HOSPITAL_COMMUNITY)
Admission: RE | Admit: 2017-06-12 | Discharge: 2017-06-12 | Disposition: A | Discharge status: Home / Self Care | Payer: BLUE CROSS/BLUE SHIELD | Source: Ambulatory Visit | Attending: "Endocrinology | Admitting: "Endocrinology

## 2017-06-12 DIAGNOSIS — C73 Malignant neoplasm of thyroid gland: Secondary | ICD-10-CM | POA: Diagnosis not present

## 2017-06-12 MED ORDER — SODIUM IODIDE I 131 CAPSULE
4.0000 | Freq: Once | INTRAVENOUS | Status: AC | PRN
  Administered 2017-06-12: 4.04 via ORAL

## 2017-06-13 LAB — TSH: TSH: >150 mIU/L — ABNORMAL HIGH (ref 0.40–4.50)

## 2017-06-13 LAB — THYROGLOBULIN LEVEL: Thyroglobulin: 0.3 ng/mL — ABNORMAL LOW

## 2017-06-13 LAB — THYROGLOBULIN ANTIBODY: Thyroglobulin Ab: <1 IU/mL (ref <=1)

## 2017-06-13 LAB — T4, FREE: FREE T4: 2 ng/dL — AB (ref 0.8–1.8)

## 2017-06-14 ENCOUNTER — Other Ambulatory Visit (HOSPITAL_COMMUNITY): Payer: BLUE CROSS/BLUE SHIELD

## 2017-06-14 ENCOUNTER — Encounter (HOSPITAL_COMMUNITY)
Admission: RE | Admit: 2017-06-14 | Discharge: 2017-06-14 | Disposition: A | Discharge status: Home / Self Care | Payer: BLUE CROSS/BLUE SHIELD | Source: Ambulatory Visit | Attending: "Endocrinology | Admitting: "Endocrinology

## 2017-06-14 DIAGNOSIS — C73 Malignant neoplasm of thyroid gland: Secondary | ICD-10-CM | POA: Diagnosis not present

## 2017-06-17 ENCOUNTER — Encounter: Payer: Self-pay | Admitting: "Endocrinology

## 2017-06-17 ENCOUNTER — Other Ambulatory Visit (HOSPITAL_COMMUNITY): Payer: BLUE CROSS/BLUE SHIELD

## 2017-06-17 ENCOUNTER — Ambulatory Visit: Payer: BLUE CROSS/BLUE SHIELD | Admitting: "Endocrinology

## 2017-06-17 VITALS — BP 136/88 | Ht 66.0 in | Wt 240.0 lb

## 2017-06-17 DIAGNOSIS — R7303 Prediabetes: Secondary | ICD-10-CM

## 2017-06-17 DIAGNOSIS — I10 Essential (primary) hypertension: Secondary | ICD-10-CM

## 2017-06-17 DIAGNOSIS — E890 Postprocedural hypothyroidism: Secondary | ICD-10-CM

## 2017-06-17 DIAGNOSIS — C73 Malignant neoplasm of thyroid gland: Secondary | ICD-10-CM | POA: Diagnosis not present

## 2017-06-17 MED ORDER — METFORMIN HCL 500 MG PO TABS: 500.0000 mg | ORAL_TABLET | Freq: Every day | ORAL | 6 refills | Status: DC

## 2017-06-17 MED ORDER — LEVOTHYROXINE SODIUM 125 MCG PO TABS: ORAL_TABLET | ORAL | 6 refills | Status: DC

## 2017-06-17 NOTE — Progress Notes (Signed)
Subjective:    Patient ID: Sophia Wilson, female    DOB: [birth year], PCP Sharilyn Sites, MD   Past Medical History:  Diagnosis Date  . Anemia   . Cancer [facility])    thyroid cancer  . Collagen vascular disease ([place])   . Eczema   . GERD (gastroesophageal reflux disease)   . H/O seasonal allergies   . Herpes simplex without mention of complication    rt thigh  . Hypertension   . Thyroid disease    cancer  . Vitiligo    Past Surgical History:  Procedure Laterality Date  . ABDOMINAL HYSTERECTOMY    . COLONOSCOPY  03/10/2012   Procedure: COLONOSCOPY;  Surgeon: Danie Binder, MD;  Location[facility];  Service: Endoscopy;  Laterality: N/A;  10:30 AM  . THYROIDECTOMY  08/20/2012   Procedure: THYROIDECTOMY;  Surgeon: Jamesetta So, MD;  Location: [facility] ORS;  Service: General;  Laterality: N/A;  Total Thyroidectomy   Social History   Socioeconomic History  . Marital status: Married    Spouse name: None  . Number of children: None  . Years of education: None  . Highest education level: None  Social Needs  . Financial resource strain: None  . Food insecurity - worry: None  . Food insecurity - inability: None  . Transportation needs - medical: None  . Transportation needs - non-medical: None  Occupational History  . None  Tobacco Use  . Smoking status: Never Smoker  . Smokeless tobacco: Never Used  Substance and Sexual Activity  . Alcohol use: No  . Drug use: No  . Sexual activity: Yes    Birth control/protection: Surgical    Comment: hysterectomy  Other Topics Concern  . None  Social History Narrative  . None   Outpatient Encounter Medications as of 06/17/2017  Medication Sig  . aspirin EC 81 MG tablet Take 81 mg by mouth daily.  . calcium-vitamin D (OSCAL 500/200 D-3) 500-200 MG-UNIT per tablet Take 1 tablet by mouth 2 (two) times daily. (Patient taking differently: Take 1 tablet by mouth daily. )  . fluticasone (FLONASE) 50 MCG/ACT nasal spray Place 2 sprays  into the nose daily as needed. For allergies  . ibuprofen (ADVIL,MOTRIN) 200 MG tablet Take 200 mg by mouth as needed.  Marland Kitchen levothyroxine (SYNTHROID, LEVOTHROID) 125 MCG tablet TAKE (1) TABLET BY MOUTH EACH MORNING, WAIT 1/2 HOUR BEFORE EATING BREAKFAST OR TAKING OTHER MEDICATIONS.  Marland Kitchen lidocaine (LIDODERM) 5 % PLACE 1 PATCH ONTO THE SKIN ONCE DAILY. REMOVE USED PATCH AFTER 12 HOURS.  Marland Kitchen loratadine (CLARITIN) 10 MG tablet Take 10 mg by mouth daily as needed. For allergies  . meloxicam (MOBIC) 15 MG tablet Take 15 mg by mouth daily.  . metFORMIN (GLUCOPHAGE) 500 MG tablet Take 1 tablet (500 mg total) by mouth daily.  . Olmesartan-Amlodipine-HCTZ (TRIBENZOR) 40-5-25 MG TABS Take 1 tablet by mouth daily.    Marland Kitchen triamcinolone cream (KENALOG) 0.1 % 1 application as needed.   . [DISCONTINUED] levothyroxine (SYNTHROID, LEVOTHROID) 125 MCG tablet TAKE (1) TABLET BY MOUTH EACH MORNING, WAIT 1/2 HOUR BEFORE EATING BREAKFAST OR TAKING OTHER MEDICATIONS.  . [DISCONTINUED] metFORMIN (GLUCOPHAGE) 500 MG tablet Take 1 tablet (500 mg total) by mouth daily.   No facility-administered encounter medications on file as of 06/17/2017.    ALLERGIES: Allergies  Allergen Reactions  . Sulfonamide Derivatives Hives   VACCINATION STATUS:  There is no immunization history on file for this patient.  HPI 61 yr old  female with medical hx as above.  She is here to f/u for history of papillary thyroid cancer , postsurgical hypothyroidism, hypocalcemia, prediabetes - she underwent thyroidectomy for PTC in February 2014 and rTSH stimulated RAI thyroid remnant ablation on 09/05/2012.  her pathology revealed multifocal 1.2, and 0.3 cms follicular variant PTC.  Her surveillance neck ultrasound is negative for any residual thyroid tissue x2.  Second surveillance study with rTSH WBS showed a "focus of abnormal tracer accumulation in mid chest at the midline highly suspicious for metastatic disease potentially to a mediastinal lymph  node". She was sent for CT of thorax/neck with contrast which she underwent on 03/18/2015. This was reported as unremarkable, might have been artifactual. -Subsequent surveillance study with rTSH stimulation showed absence of the previously noticed abnormal tracer accumulation in the mid chest.  - Her most recent whole-body scan with Thyrogen reveals no evidence of distant iodine avid metastasis.  She is on levothyroxine 125 mcg po qday. She is also on calcium supplement.  She denies dysphagia, SOB, nor voice change. She denies any family hx of thyroid dysfunction nor cancer.    Review of Systems  Constitutional:  Steady weight gain, no fatigue, no subjective hyperthermia/hypothermia Eyes: no blurry vision, no xerophthalmia ENT: no sore throat, no nodules palpated in throat, no dysphagia/odynophagia, no hoarseness Cardiovascular: no CP/SOB/palpitations/leg swelling Respiratory: no cough/SOB Gastrointestinal: no N/V/D/C Musculoskeletal: no muscle/joint aches Skin: no rashes Neurological: no tremors/numbness/tingling/dizziness Psychiatric: no depression/anxiety  Objective:    BP 136/88   Ht '5\' 6"'  (1.676 m)   Wt 240 lb (108.9 kg)   BMI 38.74 kg/m   Wt Readings from Last 3 Encounters:  06/17/17 240 lb (108.9 kg)  12/14/16 239 lb (108.4 kg)  07/11/16 226 lb 8 oz (102.7 kg)    Physical Exam  Constitutional: overweight, in NAD Eyes: PERRLA, EOMI, no exophthalmos ENT: moist mucous membranes,  status post thyroidectomy, no cervical lymphadenopathy Cardiovascular: RRR, No MRG Respiratory: CTA B Gastrointestinal: abdomen soft, NT, ND, BS+ Musculoskeletal: no deformities, strength intact in all 4 Skin: moist, warm, no rashes Neurological: no tremor with outstretched hands, DTR normal in all 4  Complete Blood Count (Most recent): Lab Results  Component Value Date   WBC 6.3 08/21/2012   HGB 10.0 (L) 08/21/2012   HCT 29.9 (L) 08/21/2012   MCV 81.7 08/21/2012   PLT 241 08/21/2012    Chemistry (most recent): Lab Results  Component Value Date   NA 138 12/06/2016   K 4.7 12/06/2016   CL 102 12/06/2016   CO2 30 12/06/2016   BUN 23 12/06/2016   CREATININE 0.97 12/06/2016   Results for ADA, HOLNESS (MRN [id number]) as of 06/17/2017 10:12  Ref. Range 12/06/2016 09:13 06/12/2017 07:50  TSH Latest Ref Range: 0.40 - 4.50 mIU/L 0.09 (L) >150.00 (H)  T4,Free(Direct) Latest Ref Range: 0.8 - 1.8 ng/dL 1.5 2.0 (H)  Thyroglobulin Latest Units: ng/mL  0.3 (L)  Thyroglobulin Ab Latest Ref Range: < or = 1 IU/mL  <1   Assessment & Plan:   1. Malignant neoplasm of thyroid gland (HCC)  - She has had multifocal FVPTC ( 1.2 and 0.3 cms), stage 1 , with no evidence of lymphovascular invasion. She is s/p total thyroidectomy February 2014. She is s/p rTSH stimulated RAI remnant ablation with post therapy scan which is negative for distant spread on 09/05/2012. Her stimulated TG level was 1.9 along with <20 Tg Abs.  her surveillance thyroid u/s is negative for any residual thyroid tissue x 1.  Her last neck/ thyroid ultrasound is s/f surgically absent thyroid .  Second surveillance study with rTSH WBS on 02/18/2015 showed a "focus of abnormal tracer accumulation in mid chest at the midline highly suspicious for metastatic disease potentially to a mediastinal lymph node". - CT with contrast of her chest and neck were unremarkable, might have been artifactual.     Her  rTSH stimulated whole-body scan on 12/09/2015 is reported as unremarkable,  Specifically, focus of abnormal uptake seen within the mid chest on the previous exam is no longer identified.  -  thyroglobulin level is 0.1 improving from 1.9 ,  with antithyroglobulin antibodies at <0.1.  -  Her surveillance thyroid/neck ultrasound on 04/03/2016 was unremarkable,  showing surgically absent thyroid. - Her most recent Thyrogen stimulated whole-body scan from 06/14/2017 and thyroglobulin level before this visit where reassuring  with no evidence of distant iodine avid metastasis. - Plan to get thyroid/neck ultrasound in 1 year.   2. Postsurgical hypothyroidism - Post surgical hypothyroidism on suppressive therapy: Her thyroid function tests are appropriate at this time. I  will continue levothyroxine at current dose of 125 mcg po qam.  - We discussed about correct intake of levothyroxine, at fasting, with water, separated by at least 30 minutes from breakfast, and separated by more than 4 hours from calcium, iron, multivitamins, acid reflux medications (PPIs). -Patient is made aware of the fact that thyroid hormone replacement is needed for life, dose to be adjusted by periodic monitoring of thyroid function tests.   3.  hypertension - Controlled. I advised her to continue to take her blood pressure medications in the morning.  4. Pre-diabetes -She is tolerating low dose metformin 579m po qday, will continue. Her recent a1c was  6 %. -  Suggestion is made for her to avoid simple carbohydrates  from her diet including Cakes, Sweet Desserts / Pastries, Ice Cream, Soda (diet and regular), Sweet Tea, Candies, Chips, Cookies, Store Bought Juices, Alcohol in Excess of  1-2 drinks a day, Artificial Sweeteners, and "Sugar-free" Products. This will help patient to have stable blood glucose profile and potentially avoid unintended weight gain.   5) calcium: It appears that she is requiring long-term calcium supplement. Her PTH is 31 along with calcium of 9.6 on calcium carbonate 1250 mg by mouth twice a day. I advised her to continue on the same.  - I advised patient to maintain close follow up with GSharilyn Sites MD for primary care needs.  .1 Follow up plan: Return in about 6 months (around 12/16/2017) for follow up with pre-visit labs.  GGlade Lloyd MD Phone: [phone] Fax: [phone] -  This note was partially dictated with voice recognition software. Similar sounding words can be transcribed inadequately or may  not  be corrected upon review.  06/17/2017, 10:57 AM

## 2017-06-18 ENCOUNTER — Other Ambulatory Visit (HOSPITAL_COMMUNITY): Payer: BLUE CROSS/BLUE SHIELD

## 2017-06-19 ENCOUNTER — Other Ambulatory Visit (HOSPITAL_COMMUNITY): Payer: BLUE CROSS/BLUE SHIELD

## 2017-06-21 ENCOUNTER — Other Ambulatory Visit (HOSPITAL_COMMUNITY): Payer: BLUE CROSS/BLUE SHIELD

## 2017-07-12 ENCOUNTER — Encounter: Payer: Self-pay | Admitting: Adult Health

## 2017-07-12 ENCOUNTER — Ambulatory Visit: Payer: BLUE CROSS/BLUE SHIELD | Admitting: Adult Health

## 2017-07-12 VITALS — BP 132/76 | HR 71 | Ht 65.5 in | Wt 236.0 lb

## 2017-07-12 DIAGNOSIS — Z1211 Encounter for screening for malignant neoplasm of colon: Secondary | ICD-10-CM | POA: Diagnosis not present

## 2017-07-12 DIAGNOSIS — Z1212 Encounter for screening for malignant neoplasm of rectum: Secondary | ICD-10-CM

## 2017-07-12 DIAGNOSIS — Z01419 Encounter for gynecological examination (general) (routine) without abnormal findings: Secondary | ICD-10-CM | POA: Diagnosis not present

## 2017-07-12 LAB — HEMOCCULT GUIAC POC 1CARD (OFFICE): FECAL OCCULT BLD: NEGATIVE

## 2017-07-12 NOTE — Progress Notes (Signed)
Patient ID: Sophia Wilson, female   DOB: [birth year], 61 y.o.   MRN: [id number] History of Present Illness: Sophia Wilson is a 61 year old black female in for well woman gyn exam, sp hysterectomy. PCP is Dr Hilma Favors.   Current Medications, Allergies, Past Medical History, Past Surgical History, Family History and Social History were reviewed in [employer] record.     Review of Systems: Patient denies any headaches, hearing loss, fatigue, blurred vision, shortness of breath, chest pain,  problems with bowel movements(does not have BM daily), urination, or intercourse. No joint pain or mood swings. Chronic low back pain, and has pain in left side at times.    Physical Exam:BP 132/76 (BP Location: Left Arm, Patient Position: Sitting, Cuff Size: Large)   Pulse 71   Ht 5' 5.5" (1.664 m)   Wt 236 lb (107 kg)   BMI 38.68 kg/m  General:  Well developed, well nourished, no acute distress Skin:  Warm and dry Neck:  Midline trachea, thyroid is surgically absent, good ROM, no lymphadenopathy,no carotid bruits heard  Lungs; Clear to auscultation bilaterally Breast:  No dominant palpable mass, retraction, or nipple discharge Cardiovascular: Regular rate and rhythm Abdomen:  Soft, non tender, no hepatosplenomegaly Pelvic:  External genitalia is normal in appearance, no lesions.  The vagina is normal in appearance. Urethra has no lesions or masses. The cervix and uterus are absent.   No adnexal masses or tenderness noted.Bladder is non tender, no masses felt. Rectal: Good sphincter tone, no polyps, or hemorrhoids felt.  Hemoccult negative. Extremities/musculoskeletal:  No swelling or varicosities noted, no clubbing or cyanosis Psych:  No mood changes, alert and cooperative,seems happy PHQ 2 score 0.  Impression: 1. Well woman exam with routine gynecological exam   2. Screening for colorectal cancer       Plan:  Physical in 1 year See if can relate left side pain to foods or  bowel changes Mammogram yearly Labs with PCP

## 2017-10-30 DIAGNOSIS — H5203 Hypermetropia, bilateral: Secondary | ICD-10-CM | POA: Diagnosis not present

## 2017-10-30 DIAGNOSIS — Z7984 Long term (current) use of oral hypoglycemic drugs: Secondary | ICD-10-CM | POA: Diagnosis not present

## 2017-10-30 DIAGNOSIS — E119 Type 2 diabetes mellitus without complications: Secondary | ICD-10-CM | POA: Diagnosis not present

## 2017-10-30 DIAGNOSIS — H2513 Age-related nuclear cataract, bilateral: Secondary | ICD-10-CM | POA: Diagnosis not present

## 2017-11-26 DIAGNOSIS — R7309 Other abnormal glucose: Secondary | ICD-10-CM | POA: Diagnosis not present

## 2017-11-26 DIAGNOSIS — C73 Malignant neoplasm of thyroid gland: Secondary | ICD-10-CM | POA: Diagnosis not present

## 2017-11-26 DIAGNOSIS — I10 Essential (primary) hypertension: Secondary | ICD-10-CM | POA: Diagnosis not present

## 2017-11-26 DIAGNOSIS — Z6835 Body mass index (BMI) 35.0-35.9, adult: Secondary | ICD-10-CM | POA: Diagnosis not present

## 2017-11-26 DIAGNOSIS — Z1389 Encounter for screening for other disorder: Secondary | ICD-10-CM | POA: Diagnosis not present

## 2017-11-26 DIAGNOSIS — E6609 Other obesity due to excess calories: Secondary | ICD-10-CM | POA: Diagnosis not present

## 2017-11-26 DIAGNOSIS — E039 Hypothyroidism, unspecified: Secondary | ICD-10-CM | POA: Diagnosis not present

## 2017-12-06 DIAGNOSIS — E559 Vitamin D deficiency, unspecified: Secondary | ICD-10-CM | POA: Diagnosis not present

## 2017-12-06 DIAGNOSIS — R7303 Prediabetes: Secondary | ICD-10-CM | POA: Diagnosis not present

## 2017-12-06 DIAGNOSIS — E890 Postprocedural hypothyroidism: Secondary | ICD-10-CM | POA: Diagnosis not present

## 2017-12-06 DIAGNOSIS — R7309 Other abnormal glucose: Secondary | ICD-10-CM | POA: Diagnosis not present

## 2017-12-07 LAB — HEMOGLOBIN A1C
EAG (MMOL/L): 7.4 (calc)
Hgb A1c MFr Bld: 6.3 % of total Hgb — ABNORMAL HIGH (ref <5.7)
Mean Plasma Glucose: 134 (calc)

## 2017-12-07 LAB — TSH: TSH: 0.22 m[IU]/L — AB (ref 0.40–4.50)

## 2017-12-07 LAB — VITAMIN D 25 HYDROXY (VIT D DEFICIENCY, FRACTURES): Vit D, 25-Hydroxy: 21 ng/mL — ABNORMAL LOW (ref 30–100)

## 2017-12-07 LAB — T4, FREE: FREE T4: 2.2 ng/dL — AB (ref 0.8–1.8)

## 2017-12-16 ENCOUNTER — Encounter: Payer: Self-pay | Admitting: "Endocrinology

## 2017-12-16 ENCOUNTER — Ambulatory Visit: Payer: BLUE CROSS/BLUE SHIELD | Admitting: "Endocrinology

## 2017-12-16 VITALS — BP 128/84 | HR 68 | Ht 65.5 in | Wt 230.0 lb

## 2017-12-16 DIAGNOSIS — E890 Postprocedural hypothyroidism: Secondary | ICD-10-CM | POA: Diagnosis not present

## 2017-12-16 DIAGNOSIS — R7303 Prediabetes: Secondary | ICD-10-CM | POA: Diagnosis not present

## 2017-12-16 DIAGNOSIS — I10 Essential (primary) hypertension: Secondary | ICD-10-CM

## 2017-12-16 DIAGNOSIS — C73 Malignant neoplasm of thyroid gland: Secondary | ICD-10-CM

## 2017-12-16 MED ORDER — METFORMIN HCL 500 MG PO TABS
500.0000 mg | ORAL_TABLET | Freq: Every day | ORAL | 2 refills | Status: DC
Start: 2017-12-16 — End: 2018-06-18

## 2017-12-16 MED ORDER — LEVOTHYROXINE SODIUM 112 MCG PO TABS: ORAL_TABLET | ORAL | 6 refills | Status: DC

## 2017-12-16 NOTE — Progress Notes (Signed)
Subjective:    Patient ID: Sophia Wilson, female    DOB: Jun 27, 1956, PCP Sharilyn Sites, MD   Past Medical History:  Diagnosis Date  . Anemia   . Cancer [facility])    thyroid cancer  . Collagen vascular disease ([place])   . Eczema   . Fibroid tumor   . GERD (gastroesophageal reflux disease)   . H/O seasonal allergies   . Herpes simplex without mention of complication    rt thigh  . Hypertension   . Thyroid disease    cancer  . Vitiligo    Past Surgical History:  Procedure Laterality Date  . ABDOMINAL HYSTERECTOMY    . COLONOSCOPY  03/10/2012   Procedure: COLONOSCOPY;  Surgeon: Danie Binder, MD;  Location: [facility];  Service: Endoscopy;  Laterality: N/A;  10:30 AM  . THYROIDECTOMY  08/20/2012   Procedure: THYROIDECTOMY;  Surgeon: Jamesetta So, MD;  Location: [facility] ORS;  Service: General;  Laterality: N/A;  Total Thyroidectomy   Social History   Socioeconomic History  . Marital status: Married    Spouse name: Not on file  . Number of children: Not on file  . Years of education: Not on file  . Highest education level: Not on file  Occupational History  . Not on file  Social Needs  . Financial resource strain: Not on file  . Food insecurity:    Worry: Not on file    Inability: Not on file  . Transportation needs:    Medical: Not on file    Non-medical: Not on file  Tobacco Use  . Smoking status: Never Smoker  . Smokeless tobacco: Never Used  Substance and Sexual Activity  . Alcohol use: No  . Drug use: No  . Sexual activity: Yes    Birth control/protection: Surgical    Comment: hysterectomy  Lifestyle  . Physical activity:    Days per week: Not on file    Minutes per session: Not on file  . Stress: Not on file  Relationships  . Social connections:    Talks on phone: Not on file    Gets together: Not on file    Attends religious service: Not on file    Active member of club or organization: Not on file    Attends meetings of clubs or organizations: Not  on file    Relationship status: Not on file  Other Topics Concern  . Not on file  Social History Narrative  . Not on file   Outpatient Encounter Medications as of 12/16/2017  Medication Sig  . aspirin EC 81 MG tablet Take 81 mg by mouth daily.  . calcium-vitamin D (OSCAL 500/200 D-3) 500-200 MG-UNIT per tablet Take 1 tablet by mouth 2 (two) times daily. (Patient taking differently: Take 1 tablet by mouth daily. )  . fluticasone (FLONASE) 50 MCG/ACT nasal spray Place 2 sprays into the nose daily as needed. For allergies  . levothyroxine (SYNTHROID, LEVOTHROID) 112 MCG tablet TAKE (1) TABLET BY MOUTH EACH MORNING, WAIT 1/2 HOUR BEFORE EATING BREAKFAST OR TAKING OTHER MEDICATIONS.  Marland Kitchen lidocaine (LIDODERM) 5 % PLACE 1 PATCH ONTO THE SKIN ONCE DAILY. REMOVE USED PATCH AFTER 12 HOURS. (Patient taking differently: PLACE 1 PATCH ONTO THE SKIN ONCE DAILY PRN. REMOVE USED PATCH AFTER 12 HOURS.)  . loratadine (CLARITIN) 10 MG tablet Take 10 mg by mouth daily as needed. For allergies  . meloxicam (MOBIC) 15 MG tablet Take 15 mg by mouth daily.  . metFORMIN (  GLUCOPHAGE) 500 MG tablet Take 1 tablet (500 mg total) by mouth daily.  . Olmesartan-Amlodipine-HCTZ (TRIBENZOR) 40-5-25 MG TABS Take 1 tablet by mouth daily.    Marland Kitchen triamcinolone cream (KENALOG) 0.1 % 1 application as needed.   . [DISCONTINUED] levothyroxine (SYNTHROID, LEVOTHROID) 125 MCG tablet TAKE (1) TABLET BY MOUTH EACH MORNING, WAIT 1/2 HOUR BEFORE EATING BREAKFAST OR TAKING OTHER MEDICATIONS.  . [DISCONTINUED] metFORMIN (GLUCOPHAGE) 500 MG tablet Take 1 tablet (500 mg total) by mouth daily.   No facility-administered encounter medications on file as of 12/16/2017.    ALLERGIES: Allergies  Allergen Reactions  . Sulfonamide Derivatives Hives   VACCINATION STATUS:  There is no immunization history on file for this patient.  HPI 62 yr old female with medical hx as above.  She is here to follow-up for her history of papillary thyroid cancer ,  postsurgical hypothyroidism, hypocalcemia, prediabetes. - she underwent thyroidectomy for PTC in February 2014 and rTSH stimulated RAI thyroid remnant ablation on 09/05/2012.  her pathology revealed multifocal 1.2, and 0.3 cms follicular variant PTC.  Her surveillance neck ultrasound is negative for any residual thyroid tissue x2.  Second surveillance study with rTSH WBS showed a "focus of abnormal tracer accumulation in mid chest at the midline highly suspicious for metastatic disease potentially to a mediastinal lymph node". She was sent for CT of thorax/neck with contrast which she underwent on 03/18/2015. This was reported as unremarkable, might have been artifactual. -Subsequent surveillance study with rTSH stimulation showed absence of the previously noticed abnormal tracer accumulation in the mid chest.  - Her most recent whole-body scan with Thyrogen reveals no evidence of distant iodine avid metastasis.  She is on levothyroxine 125 mcg po qday. She is also on calcium supplement.  She denies dysphagia, SOB, nor voice change.  -She continues to feel better.  She lost 10 pounds since last visit.  She has no new complaints today.   -She denies any family hx of thyroid dysfunction nor cancer.    Review of Systems  Constitutional:  + lost weight, no fatigue, no subjective hyperthermia/hypothermia Eyes: no blurry vision, no xerophthalmia ENT: no sore throat, no nodules palpated in throat, no dysphagia/odynophagia, no hoarseness Cardiovascular: no chest pain, no palpitations.   Gastrointestinal: no nausea, no diarrhea.   Musculoskeletal: no muscle/joint aches Skin: no rashes Neurological: no tremors/numbness/tingling/dizziness Psychiatric: no depression/anxiety  Objective:    BP 128/84   Pulse 68   Ht 5' 5.5" (1.664 m)   Wt 230 lb (104.3 kg)   BMI 37.69 kg/m   Wt Readings from Last 3 Encounters:  12/16/17 230 lb (104.3 kg)  07/12/17 236 lb (107 kg)  06/17/17 240 lb (108.9 kg)     Physical Exam  Constitutional: + Obese, not in acute distress.   Eyes: PERRLA, EOMI, no exophthalmos ENT: moist mucous membranes, + status post thyroidectomy, no  cervical lymphadenopathy Musculoskeletal: no deformities, strength intact in all 4 Skin: moist, warm, no rashes Neurological: no tremor with outstretched hands, DTR normal in all 4  Complete Blood Count (Most recent): Lab Results  Component Value Date   WBC 6.3 08/21/2012   HGB 10.0 (L) 08/21/2012   HCT 29.9 (L) 08/21/2012   MCV 81.7 08/21/2012   PLT 241 08/21/2012   Chemistry (most recent): Lab Results  Component Value Date   NA 138 12/06/2016   K 4.7 12/06/2016   CL 102 12/06/2016   CO2 30 12/06/2016   BUN 23 12/06/2016   CREATININE 0.97 12/06/2016  Results for CHRISTABELL, LOSEKE (MRN [id number]) as of 12/16/2017 09:59  Ref. Range 12/06/2017 09:09  eAG (mmol/L) Latest Units: (calc) 7.4  Hemoglobin A1C Latest Ref Range: <5.7 % of total Hgb 6.3 (H)  TSH Latest Ref Range: 0.40 - 4.50 mIU/L 0.22 (L)  T4,Free(Direct) Latest Ref Range: 0.8 - 1.8 ng/dL 2.2 (H)    Assessment & Plan:   1. Malignant neoplasm of thyroid gland ([facility])  - She has had multifocal FVPTC ( 1.2 and 0.3 cms), stage 1 , with no evidence of lymphovascular invasion. She is s/p total thyroidectomy February 2014. She is s/p rTSH stimulated RAI remnant ablation with post therapy scan which is negative for distant spread on 09/05/2012. Her stimulated TG level was 1.9 along with <20 Tg Abs.  her surveillance thyroid u/s is negative for any residual thyroid tissue x 1. Her last neck/ thyroid ultrasound is s/f surgically absent thyroid .  Second surveillance study with rTSH WBS on 02/18/2015 showed a "focus of abnormal tracer accumulation in mid chest at the midline highly suspicious for metastatic disease potentially to a mediastinal lymph node". - CT with contrast of her chest and neck were unremarkable, might have been artifactual.     Her  rTSH  stimulated whole-body scan on 12/09/2015 is reported as unremarkable,  Specifically, focus of abnormal uptake seen within the mid chest on the previous exam is no longer identified.  -  thyroglobulin level is 0.1 improving from 1.9 ,  with antithyroglobulin antibodies at <0.1.  -  Her surveillance thyroid/neck ultrasound on 04/03/2016 was unremarkable,  showing surgically absent thyroid. - Her most recent Thyrogen stimulated whole-body scan from 06/14/2017 and thyroglobulin level before this visit where reassuring with no evidence of distant iodine avid metastasis.  - Plan to get thyroid/neck ultrasound in 6 months.     2. Postsurgical hypothyroidism - Post surgical hypothyroidism on suppressive therapy: Her thyroid function tests are consistent with slight overtreatment.   -I discussed and lowered her levothyroxine to 112 mcg p.o. Am.    . - We discussed about correct intake of levothyroxine, at fasting, with water, separated by at least 30 minutes from breakfast, and separated by more than 4 hours from calcium, iron, multivitamins, acid reflux medications (PPIs). -Patient is made aware of the fact that thyroid hormone replacement is needed for life, dose to be adjusted by periodic monitoring of thyroid function tests.  3.  hypertension - Controlled. I advised her to continue to take her blood pressure medications in the morning.  4. Pre-diabetes -She is tolerating low dose metformin 559m po qday, will continue. Her recent a1c is slightly higher at 6.3 %.  We will continue to benefit from low-dose metformin, I refilled it for her.  -  Suggestion is made for her to avoid simple carbohydrates  from her diet including Cakes, Sweet Desserts / Pastries, Ice Cream, Soda (diet and regular), Sweet Tea, Candies, Chips, Cookies, Store Bought Juices, Alcohol in Excess of  1-2 drinks a day, Artificial Sweeteners, and "Sugar-free" Products. This will help patient to have stable blood glucose profile and  potentially avoid unintended weight gain.  5) calcium: It appears that she is requiring long-term calcium supplement. Her PTH is 31 along with calcium of 9.6 on calcium carbonate 1250 mg by mouth twice a day. I advised her to continue on the same.  - I advised patient to maintain close follow up with GSharilyn Sites MD for primary care needs.  - Time spent with the patient: 25  min, of which >50% was spent in reviewing her  current and  previous labs, previous treatments, and medications doses and developing a plan for long-term care.  Sophia Wilson participated in the discussions, expressed understanding, and voiced agreement with the above plans.  All questions were answered to her satisfaction. she is encouraged to contact clinic should she have any questions or concerns prior to her return visit.  Follow up plan: Return in about 6 months (around 06/17/2018) for follow up with pre-visit labs, Thyroid / Neck Ultrasound.  Glade Lloyd, MD Phone: [phone]  Fax: [phone]  -  This note was partially dictated with voice recognition software. Similar sounding words can be transcribed inadequately or may not  be corrected upon review.  12/16/2017, 10:14 AM

## 2018-04-18 ENCOUNTER — Ambulatory Visit (INDEPENDENT_AMBULATORY_CARE_PROVIDER_SITE_OTHER): Payer: BLUE CROSS/BLUE SHIELD | Admitting: *Deleted

## 2018-04-18 DIAGNOSIS — Z23 Encounter for immunization: Secondary | ICD-10-CM

## 2018-05-05 ENCOUNTER — Other Ambulatory Visit (HOSPITAL_COMMUNITY): Payer: Self-pay | Admitting: Family Medicine

## 2018-05-05 DIAGNOSIS — Z1231 Encounter for screening mammogram for malignant neoplasm of breast: Secondary | ICD-10-CM

## 2018-05-19 ENCOUNTER — Other Ambulatory Visit: Payer: Self-pay | Admitting: "Endocrinology

## 2018-05-19 DIAGNOSIS — C73 Malignant neoplasm of thyroid gland: Secondary | ICD-10-CM

## 2018-06-05 ENCOUNTER — Ambulatory Visit (HOSPITAL_COMMUNITY)
Admission: RE | Admit: 2018-06-05 | Discharge: 2018-06-05 | Disposition: A | Discharge status: Home / Self Care | Payer: BLUE CROSS/BLUE SHIELD | Source: Ambulatory Visit | Attending: Family Medicine | Admitting: Family Medicine

## 2018-06-05 ENCOUNTER — Encounter (HOSPITAL_COMMUNITY): Payer: Self-pay

## 2018-06-05 DIAGNOSIS — Z1231 Encounter for screening mammogram for malignant neoplasm of breast: Secondary | ICD-10-CM | POA: Insufficient documentation

## 2018-06-09 DIAGNOSIS — E890 Postprocedural hypothyroidism: Secondary | ICD-10-CM | POA: Diagnosis not present

## 2018-06-09 DIAGNOSIS — C73 Malignant neoplasm of thyroid gland: Secondary | ICD-10-CM | POA: Diagnosis not present

## 2018-06-10 LAB — TSH: TSH: 0.64 m[IU]/L (ref 0.40–4.50)

## 2018-06-10 LAB — T4, FREE: FREE T4: 1.7 ng/dL (ref 0.8–1.8)

## 2018-06-10 LAB — THYROGLOBULIN LEVEL: Thyroglobulin: 0.1 ng/mL — ABNORMAL LOW

## 2018-06-10 LAB — THYROGLOBULIN ANTIBODY: Thyroglobulin Ab: <1 IU/mL (ref <=1)

## 2018-06-18 ENCOUNTER — Encounter: Payer: Self-pay | Admitting: "Endocrinology

## 2018-06-18 ENCOUNTER — Ambulatory Visit: Payer: BLUE CROSS/BLUE SHIELD | Admitting: "Endocrinology

## 2018-06-18 VITALS — BP 134/85 | HR 69 | Ht 65.5 in | Wt 232.0 lb

## 2018-06-18 DIAGNOSIS — E890 Postprocedural hypothyroidism: Secondary | ICD-10-CM

## 2018-06-18 DIAGNOSIS — R7303 Prediabetes: Secondary | ICD-10-CM

## 2018-06-18 DIAGNOSIS — C73 Malignant neoplasm of thyroid gland: Secondary | ICD-10-CM

## 2018-06-18 DIAGNOSIS — I10 Essential (primary) hypertension: Secondary | ICD-10-CM | POA: Diagnosis not present

## 2018-06-18 MED ORDER — LEVOTHYROXINE SODIUM 112 MCG PO TABS: ORAL_TABLET | ORAL | 6 refills | Status: DC

## 2018-06-18 MED ORDER — METFORMIN HCL 500 MG PO TABS
500.0000 mg | ORAL_TABLET | Freq: Every day | ORAL | 2 refills | Status: DC
Start: 2018-06-18 — End: 2019-06-15

## 2018-06-18 NOTE — Progress Notes (Signed)
Endocrinology follow-up note  Subjective:    Patient ID: Sophia Wilson, female    DOB: [birth year], PCP Sharilyn Sites, MD   Past Medical History:  Diagnosis Date  . Anemia   . Cancer [facility])    thyroid cancer  . Collagen vascular disease ([place])   . Eczema   . Fibroid tumor   . GERD (gastroesophageal reflux disease)   . H/O seasonal allergies   . Herpes simplex without mention of complication    rt thigh  . Hypertension   . Thyroid disease    cancer  . Vitiligo    Past Surgical History:  Procedure Laterality Date  . ABDOMINAL HYSTERECTOMY    . COLONOSCOPY  03/10/2012   Procedure: COLONOSCOPY;  Surgeon: Danie Binder, MD;  Location[facility];  Service: Endoscopy;  Laterality: N/A;  10:30 AM  . THYROIDECTOMY  08/20/2012   Procedure: THYROIDECTOMY;  Surgeon: Jamesetta So, MD;  Location: [facility] ORS;  Service: General;  Laterality: N/A;  Total Thyroidectomy   Social History   Socioeconomic History  . Marital status: Married    Spouse name: Not on file  . Number of children: Not on file  . Years of education: Not on file  . Highest education level: Not on file  Occupational History  . Not on file  Social Needs  . Financial resource strain: Not on file  . Food insecurity:    Worry: Not on file    Inability: Not on file  . Transportation needs:    Medical: Not on file    Non-medical: Not on file  Tobacco Use  . Smoking status: Never Smoker  . Smokeless tobacco: Never Used  Substance and Sexual Activity  . Alcohol use: No  . Drug use: No  . Sexual activity: Yes    Birth control/protection: Surgical    Comment: hysterectomy  Lifestyle  . Physical activity:    Days per week: Not on file    Minutes per session: Not on file  . Stress: Not on file  Relationships  . Social connections:    Talks on phone: Not on file    Gets together: Not on file    Attends religious service: Not on file    Active member of club or organization: Not on file    Attends meetings of  clubs or organizations: Not on file    Relationship status: Not on file  Other Topics Concern  . Not on file  Social History Narrative  . Not on file   Outpatient Encounter Medications as of 06/18/2018  Medication Sig  . aspirin EC 81 MG tablet Take 81 mg by mouth daily.  . calcium-vitamin D (OSCAL 500/200 D-3) 500-200 MG-UNIT per tablet Take 1 tablet by mouth 2 (two) times daily. (Patient taking differently: Take 1 tablet by mouth daily. )  . fluticasone (FLONASE) 50 MCG/ACT nasal spray Place 2 sprays into the nose daily as needed. For allergies  . levothyroxine (SYNTHROID, LEVOTHROID) 112 MCG tablet TAKE (1) TABLET BY MOUTH EACH MORNING, WAIT 1/2 HOUR BEFORE EATING BREAKFAST OR TAKING OTHER MEDICATIONS.  Marland Kitchen lidocaine (LIDODERM) 5 % PLACE 1 PATCH ONTO THE SKIN ONCE DAILY. REMOVE USED PATCH AFTER 12 HOURS. (Patient taking differently: PLACE 1 PATCH ONTO THE SKIN ONCE DAILY PRN. REMOVE USED PATCH AFTER 12 HOURS.)  . loratadine (CLARITIN) 10 MG tablet Take 10 mg by mouth daily as needed. For allergies  . meloxicam (MOBIC) 15 MG tablet Take 15 mg by mouth daily.  Marland Kitchen  metFORMIN (GLUCOPHAGE) 500 MG tablet Take 1 tablet (500 mg total) by mouth daily.  . Olmesartan-Amlodipine-HCTZ (TRIBENZOR) 40-5-25 MG TABS Take 1 tablet by mouth daily.    Marland Kitchen triamcinolone cream (KENALOG) 0.1 % 1 application as needed.    No facility-administered encounter medications on file as of 06/18/2018.    ALLERGIES: Allergies  Allergen Reactions  . Sulfonamide Derivatives Hives   VACCINATION STATUS: Immunization History  Administered Date(s) Administered  . Influenza,inj,Quad PF,6+ Mos 04/18/2018    HPI 62 yr old female with medical hx as above.  She is here to follow-up for her history of papillary thyroid cancer , postsurgical hypothyroidism, hypocalcemia, prediabetes. - she underwent thyroidectomy for papillary thyroid cancer in February 2014,  and rTSH stimulated RAI thyroid remnant ablation on 09/05/2012.  her  pathology revealed multifocal 1.2, and 0.3 cms follicular variant PTC.  Her surveillance neck ultrasound is negative for any residual thyroid tissue x2, last neck sonogram in 2017.  Second surveillance study with rTSH WBS showed a "focus of abnormal tracer accumulation in mid chest at the midline highly suspicious for metastatic disease potentially to a mediastinal lymph node". She was sent for CT of thorax/neck with contrast which she underwent on 03/18/2015. This was reported as unremarkable, might have been artifactual. -Subsequent surveillance study with rTSH stimulation showed absence of the previously noticed abnormal tracer accumulation in the mid chest.  - Her most recent whole-body scan with Thyrogen reveals no evidence of distant iodine avid metastasis in November 2018. -She missed 2 appointments for subsequent thyroid/neck ultrasound studies. She is on levothyroxine 125 mcg po qday. She is also on calcium supplement.  She denies dysphagia, SOB, nor voice change.  -She continues to feel better.  She lost 10 pounds since last visit.  She has no new complaints today.   -She denies any family hx of thyroid dysfunction nor cancer.    Review of Systems  Constitutional:  + steady  weight, no fatigue, no subjective hyperthermia/hypothermia Eyes: no blurry vision, no xerophthalmia ENT: no sore throat, no nodules palpated in throat, no dysphagia/odynophagia, no hoarseness Cardiovascular: no chest pain, no palpitations.   Gastrointestinal: no nausea, no diarrhea.   Musculoskeletal: no muscle/joint aches Skin: no rashes Neurological: no tremors/numbness/tingling/dizziness Psychiatric: no depression/anxiety  Objective:    BP 134/85   Pulse 69   Ht 5' 5.5" (1.664 m)   Wt 232 lb (105.2 kg)   BMI 38.02 kg/m   Wt Readings from Last 3 Encounters:  06/18/18 232 lb (105.2 kg)  12/16/17 230 lb (104.3 kg)  07/12/17 236 lb (107 kg)    Physical Exam  Constitutional: + Obese, not in acute  distress.   Eyes: PERRLA, EOMI, no exophthalmos ENT: moist mucous membranes, + status post thyroidectomy, no  cervical lymphadenopathy Musculoskeletal: no deformities, strength intact in all 4 Skin: moist, warm, no rashes Neurological: no tremor with outstretched hands, DTR normal in all 4  Complete Blood Count (Most recent): Lab Results  Component Value Date   WBC 6.3 08/21/2012   HGB 10.0 (L) 08/21/2012   HCT 29.9 (L) 08/21/2012   MCV 81.7 08/21/2012   PLT 241 08/21/2012   Chemistry (most recent): Lab Results  Component Value Date   NA 138 12/06/2016   K 4.7 12/06/2016   CL 102 12/06/2016   CO2 30 12/06/2016   BUN 23 12/06/2016   CREATININE 0.97 12/06/2016    Results for CHRISMA, HURLOCK (MRN [id number]) as of 06/18/2018 10:39  Ref. Range 12/06/2017 09:09 06/09/2018 10:13  TSH Latest Ref Range: 0.40 - 4.50 mIU/L 0.22 (L) 0.64  T4,Free(Direct) Latest Ref Range: 0.8 - 1.8 ng/dL 2.2 (H) 1.7  Thyroglobulin Latest Units: ng/mL  0.1 (L)  Thyroglobulin Ab Latest Ref Range: < or = 1 IU/mL  <1    Assessment & Plan:   1. Malignant neoplasm of thyroid gland (HCC)  - She has had multifocal FVPTC ( 1.2 and 0.3 cms), stage 1 , with no evidence of lymphovascular invasion. She is s/p total thyroidectomy February 2014. She is s/p rTSH stimulated RAI remnant ablation with post therapy scan which is negative for distant spread on 09/05/2012. Her stimulated TG level was 1.9 along with <20 Tg Abs.  her surveillance thyroid u/s is negative for any residual thyroid tissue x 1. Her last neck/ thyroid ultrasound is s/f surgically absent thyroid .  Second surveillance study with rTSH WBS on 02/18/2015 showed a "focus of abnormal tracer accumulation in mid chest at the midline highly suspicious for metastatic disease potentially to a mediastinal lymph node". - CT with contrast of her chest and neck were unremarkable, might have been artifactual.     Her  rTSH stimulated whole-body scan on  12/09/2015 is reported as unremarkable,  Specifically, focus of abnormal uptake seen within the mid chest on the previous exam is no longer identified.  -  thyroglobulin level is 0.1 improving from 1.9 ,  with antithyroglobulin antibodies at <0.1.  -  Her surveillance thyroid/neck ultrasound on 04/03/2016 was unremarkable,  showing surgically absent thyroid. - Her most recent Thyrogen stimulated whole-body scan from 06/14/2017 and thyroglobulin level before this visit where reassuring with no evidence of distant iodine avid metastasis. -Her previsit unstimulated thyroglobulin level is 0.1 along with thyroglobulin antibodies.  -She kept missing her appointments for thyroid/neck ultrasound.  She is urged to get this study as soon as possible.     2. Postsurgical hypothyroidism - Post surgical hypothyroidism on suppressive therapy: Her thyroid function tests are consistent with appropriate suppression with TSH of 0.6, free T4 of 1.7 . -She is advised to continue levothyroxine 112 mcg p.o. daily before breakfast.     - We discussed about correct intake of levothyroxine, at fasting, with water, separated by at least 30 minutes from breakfast, and separated by more than 4 hours from calcium, iron, multivitamins, acid reflux medications (PPIs). -Patient is made aware of the fact that thyroid hormone replacement is needed for life, dose to be adjusted by periodic monitoring of thyroid function tests.  3.  hypertension -Her blood pressure is controlled to target.   I advised her to continue to take her blood pressure medications in the morning.  4. Pre-diabetes -Her most recent A1c was 6.3%.  She is tolerating low dose metformin 562m po qday, will continue metformin 500 mg p.o daily after breakfast.. .  -  Suggestion is made for her to avoid simple carbohydrates  from her diet including Cakes, Sweet Desserts / Pastries, Ice Cream, Soda (diet and regular), Sweet Tea, Candies, Chips, Cookies, Store  Bought Juices, Alcohol in Excess of  1-2 drinks a day, Artificial Sweeteners, and "Sugar-free" Products. This will help patient to have stable blood glucose profile and potentially avoid unintended weight gain.   5) calcium: It appears that she is requiring long-term calcium supplement. Her PTH is 31 along with calcium of 9.6 on calcium carbonate 1250 mg by mouth twice a day. I advised her to continue on the same.  - I advised patient to maintain close follow up  with Sharilyn Sites, MD for primary care needs.  - Time spent with the patient: 25 min, of which >50% was spent in reviewing her  current and  previous labs, imaging studies, previous treatments, and medications doses and developing a plan for long-term care.  Sophia Wilson participated in the discussions, expressed understanding, and voiced agreement with the above plans.  All questions were answered to her satisfaction. she is encouraged to contact clinic should she have any questions or concerns prior to her return visit.   Follow up plan: Return in about 3 months (around 09/17/2018) for Follow up with Pre-visit Labs, Thyroid / Neck Ultrasound.  Glade Lloyd, MD Phone: [phone]  Fax: [phone]  -  This note was partially dictated with voice recognition software. Similar sounding words can be transcribed inadequately or may not  be corrected upon review.  06/18/2018, 10:37 AM

## 2018-06-24 ENCOUNTER — Ambulatory Visit (HOSPITAL_COMMUNITY)
Admission: RE | Admit: 2018-06-24 | Discharge: 2018-06-24 | Disposition: A | Discharge status: Home / Self Care | Payer: BLUE CROSS/BLUE SHIELD | Source: Ambulatory Visit | Attending: "Endocrinology | Admitting: "Endocrinology

## 2018-06-24 DIAGNOSIS — E890 Postprocedural hypothyroidism: Secondary | ICD-10-CM | POA: Diagnosis not present

## 2018-06-24 DIAGNOSIS — C73 Malignant neoplasm of thyroid gland: Secondary | ICD-10-CM | POA: Insufficient documentation

## 2018-08-27 DIAGNOSIS — Z6834 Body mass index (BMI) 34.0-34.9, adult: Secondary | ICD-10-CM | POA: Diagnosis not present

## 2018-08-27 DIAGNOSIS — I10 Essential (primary) hypertension: Secondary | ICD-10-CM | POA: Diagnosis not present

## 2018-08-27 DIAGNOSIS — C73 Malignant neoplasm of thyroid gland: Secondary | ICD-10-CM | POA: Diagnosis not present

## 2018-08-27 DIAGNOSIS — E039 Hypothyroidism, unspecified: Secondary | ICD-10-CM | POA: Diagnosis not present

## 2018-08-27 DIAGNOSIS — E7849 Other hyperlipidemia: Secondary | ICD-10-CM | POA: Diagnosis not present

## 2018-08-27 DIAGNOSIS — E1140 Type 2 diabetes mellitus with diabetic neuropathy, unspecified: Secondary | ICD-10-CM | POA: Diagnosis not present

## 2018-08-27 LAB — HEMOGLOBIN A1C: HEMOGLOBIN A1C: 6.1

## 2018-08-27 LAB — LIPID PANEL
Cholesterol: 205 — AB (ref 0–200)
HDL: 76 — AB (ref 35–70)
LDL Cholesterol: 113
Triglycerides: 80 (ref 40–160)

## 2018-08-27 LAB — VITAMIN D 25 HYDROXY (VIT D DEFICIENCY, FRACTURES): Vit D, 25-Hydroxy: 19.5

## 2018-08-27 LAB — BASIC METABOLIC PANEL
BUN: 26 — AB (ref 4–21)
CREATININE: 1.1 (ref 0.5–1.1)

## 2018-08-27 LAB — MICROALBUMIN, URINE: MICROALB UR: 30

## 2018-09-15 ENCOUNTER — Other Ambulatory Visit: Payer: Self-pay | Admitting: Podiatrist

## 2018-09-15 ENCOUNTER — Ambulatory Visit (INDEPENDENT_AMBULATORY_CARE_PROVIDER_SITE_OTHER): Payer: BLUE CROSS/BLUE SHIELD

## 2018-09-15 ENCOUNTER — Encounter: Payer: Self-pay | Admitting: Podiatrist

## 2018-09-15 ENCOUNTER — Ambulatory Visit: Payer: BLUE CROSS/BLUE SHIELD | Admitting: Podiatrist

## 2018-09-15 ENCOUNTER — Telehealth: Payer: Self-pay | Admitting: Podiatrist

## 2018-09-15 VITALS — BP 86/61 | HR 70

## 2018-09-15 DIAGNOSIS — M898X7 Other specified disorders of bone, ankle and foot: Secondary | ICD-10-CM | POA: Diagnosis not present

## 2018-09-15 DIAGNOSIS — M79672 Pain in left foot: Secondary | ICD-10-CM

## 2018-09-15 DIAGNOSIS — M2142 Flat foot [pes planus] (acquired), left foot: Secondary | ICD-10-CM | POA: Diagnosis not present

## 2018-09-15 DIAGNOSIS — R7303 Prediabetes: Secondary | ICD-10-CM | POA: Insufficient documentation

## 2018-09-15 DIAGNOSIS — E039 Hypothyroidism, unspecified: Secondary | ICD-10-CM | POA: Insufficient documentation

## 2018-09-15 DIAGNOSIS — E669 Obesity, unspecified: Secondary | ICD-10-CM | POA: Insufficient documentation

## 2018-09-15 NOTE — Telephone Encounter (Signed)
Left message for pt to call to discuss orthotic benefits. °

## 2018-09-15 NOTE — Patient Instructions (Addendum)
Flat Feet, Adult  Normally, a foot has a curve, called an arch, on its inner side. The arch creates a gap between the foot and the ground. Flat feet is a common condition in which one or both feet do not have an arch. What are the causes? This condition may be caused by:  Failure of a normal arch to develop during childhood.  An injury to tendons and ligaments in the foot, such as to the tendon that supports the arch (posterior tibial tendon).  Loose tendons or ligaments in the foot.  A wearing down of the arch over time.  Injury to bones in the foot.  An abnormality in the bones of the foot, called tarsal coalition. This happens when two or more bones in the foot are joined together (fused) before birth. What increases the risk? This condition is more likely to develop in:  Females.  Adults age 18 or older.  People who: ? Have a family history of flat feet. ? Have a history of childhood flexible flatfoot. ? Are obese. ? Have diabetes. ? Have high blood pressure. ? Participate in high-impact sports. ? Have inflammatory arthritis. ? Have a history of broken (fractured) or dislocated bones in the foot. What are the signs or symptoms? Symptoms of this condition include:  Pain or tightness along the bottom of the foot.  Foot pain that gets worse with activity.  Swelling of the inner side of the foot.  Swelling of the ankle.  Pain on the outer side of the ankle.  Changes in the way that you walk (gait).  Pronation. This is when the foot and ankle lean inward when you are standing.  Bony bumps on the top or inner side of the foot. How is this diagnosed? This condition is diagnosed with a physical exam of your foot and ankle. Your health care provider may also:  Look at your shoes for patterns of wear on the soles.  Order imaging tests, such as X-rays, a CT scan, or an MRI.  Refer you to a health care provider who specializes in feet (podiatrist) or a physical  therapist. How is this treated? This condition may be treated with:  Stretching exercises or physical therapy. This helps to increase range of motion and relieve pain.  A shoe insert (orthotic). This helps to support the arch of your foot. Orthotics can be purchased from a store or can be custom-made by your health care provider.  Wearing shoes with appropriate arch support. This is especially important for athletes.  Medicines. These may be prescribed to relieve pain.  An ankle brace, boot, or cast. These may be used to relieve pressure on your foot. You may be given crutches if walking is painful.  Surgery. This may be done to improve the alignment of your foot. This is only needed if your posterior tibial tendon is torn or if you have tarsal coalition. Follow these instructions at home: Activity  Do any exercises as told by your health care provider.  If an activity causes pain, avoid it or try to find another activity that does not cause pain. General instructions  Wear orthotics and appropriate shoes as told by your health care provider.  Take over-the-counter and prescription medicines only as told by your health care provider.  Wear an ankle brace, boot, or cast as told by your health care provider.  Use crutches as told by your health care provider.  Keep all follow-up visits as told by your health care  provider. This is important. How is this prevented? To prevent the condition from getting worse:  Wear comfortable, supportive shoes that are appropriate for your activities.  Maintain a healthy weight.  Stay active in a way that your health care provider recommends. This will help to keep your feet flexible and strong.  Manage long-term (chronic) health conditions, such as diabetes, high blood pressure, and inflammatory arthritis.  Work with a health care provider if you have concerns about your feet or shoes. Contact a health care provider if:  You have pain in  your foot or lower leg that gets worse or does not improve with medicine.  You have pain or difficulty when walking.  You have problems with your orthotics. Summary  Flat feet is a common condition in which one or both feet do not have a curve, called an arch, on the inner side.  Your health care provider may recommend a shoe insert (orthotic) or shoes with the appropriate arch support.  Other treatments may include stretching exercises or physical therapy, medicines to relieve pain, and wearing an ankle brace, boot, or cast.  Surgery may be done if you have a tear in the tendon that supports your arch (posterior tibial tendon) or if two or more of your foot bones were joined together (fused)  before birth (tarsal coalition). This information is not intended to replace advice given to you by your health care provider. Make sure you discuss any questions you have with your health care provider. Document Released: 04/29/2009 Document Revised: 09/12/2016 Document Reviewed: 09/12/2016 Elsevier Interactive Patient Education  2019 [employer].

## 2018-09-15 NOTE — Progress Notes (Signed)
   Chief Complaint  Patient presents with  . Foot Problem    Pt states bilateral dorsal numbness 63mo duration  . Foot Pain    Pt states dorsal and plantar pain 4 day duration, severe pain.     HPI: Patient is 63 y.o. female who presents today for pain on the dorsal aspect of the left foot and generalized numbness on the top of both feet for the past 2 months.  Patient rates the pain is severe.  Denies any trauma or injury to the feet.   Review of Systems  DATA OBTAINED: from patient  GENERAL: Feels well no fevers, no fatigue, no changes in appetite SKIN: No itching, no rashes, no open wounds EYES: No eye pain,no redness, no discharge EARS: No earache,no ringing of ears, NOSE: No congestion, no drainage, no bleeding  MOUTH/THROAT: No mouth pain, No sore throat, No difficulty chewing or swallowing  RESPIRATORY: No cough, no wheezing, no SOB CARDIAC: No chest pain,no heart palpitations, GI: No abdominal pain, No Nausea, no vomiting, no diarrhea, no heartburn or no reflux  GU: No dysuria, no increased frequency or urgency MUSCULOSKELETAL: No unrelieved bone/joint pain,  NEUROLOGIC: Awake, alert, appropriate to situation, No change in mental status. PSYCHIATRIC: No overt anxiety or sadness.No behavior issue.      Physical Exam  GENERAL APPEARANCE: Alert, conversant. Appropriately groomed. No acute distress.   VASCULAR: Pedal pulses palpable DP and PT bilateral.  Capillary refill time is immediate to all digits,  Proximal to distal cooling it warm to warm.  Digital hair growth is present bilateral   NEUROLOGIC: sensation is intact epicritically and protectively to 5.07 monofilament at 5/5 sites bilateral.  Light touch is intact bilateral, vibratory sensation intact bilateral, achilles tendon reflex is intact bilateral.   MUSCULOSKELETAL: acceptable muscle strength, tone and stability bilateral.  Intrinsic muscluature intact bilateral.  Range of motion at ankle and first MPJ is  normal bilateral.  Bunion deformity is present right.  Pes planus deformity noted bilateral.  Pain on the dorsal aspect of the midtarsal joint is palpated left.  DERMATOLOGIC: skin is warm, supple, and dry.  No open lesions noted.  No interdigital maceration noted bilateral.    X-ray evaluation: 3 views left foot are obtained.  Spurring at the midtarsal joint dorsally is noted on x-ray.  Inferior calcaneal spur is also present pes planus foot type is noted and bunion deformity is also present left foot.  No acute osseous abnormalities noted.  Assessment   Sensory neuropathy bilateral Dorsal exostosis left midfoot  Plan  Discussed etiology, pathology, conservative vs. Surgical therapies and at this time an injection was recommended.  The patient agreed and a sterile skin prep was applied.  An injection consisting of dexamethasone and marcaine mixture was infiltrated into the dorsal aspect of the left foot.  Compression stocking recommendation was also given and a prescription was written.  Discussed the positive long-term benefits of orthotics as well.  She is also going to speak to her primary care physician about increasing her gabapentin.  She will be seen back for follow-up.

## 2018-09-19 ENCOUNTER — Encounter: Payer: Self-pay | Admitting: "Endocrinology

## 2018-09-19 ENCOUNTER — Ambulatory Visit (INDEPENDENT_AMBULATORY_CARE_PROVIDER_SITE_OTHER): Payer: BLUE CROSS/BLUE SHIELD | Admitting: "Endocrinology

## 2018-09-19 VITALS — BP 142/88 | HR 71 | Ht 65.0 in | Wt 231.8 lb

## 2018-09-19 DIAGNOSIS — C73 Malignant neoplasm of thyroid gland: Secondary | ICD-10-CM

## 2018-09-19 DIAGNOSIS — R7303 Prediabetes: Secondary | ICD-10-CM | POA: Diagnosis not present

## 2018-09-19 DIAGNOSIS — I10 Essential (primary) hypertension: Secondary | ICD-10-CM | POA: Diagnosis not present

## 2018-09-19 DIAGNOSIS — E890 Postprocedural hypothyroidism: Secondary | ICD-10-CM | POA: Diagnosis not present

## 2018-09-19 MED ORDER — CHOLECALCIFEROL 50 MCG (2000 UT) PO CAPS: 1.0000 | ORAL_CAPSULE | Freq: Every day | ORAL | 6 refills | Status: DC

## 2018-09-19 NOTE — Progress Notes (Signed)
Endocrinology follow-up note  Subjective:    Patient ID: Sophia Wilson, female    DOB: 22-May-1956, PCP Sharilyn Sites, MD   Past Medical History:  Diagnosis Date  . Anemia   . Cancer [facility])    thyroid cancer  . Collagen vascular disease ([place])   . Eczema   . Fibroid tumor   . GERD (gastroesophageal reflux disease)   . H/O seasonal allergies   . Herpes simplex without mention of complication    rt thigh  . Hypertension   . Thyroid disease    cancer  . Vitiligo    Past Surgical History:  Procedure Laterality Date  . ABDOMINAL HYSTERECTOMY    . COLONOSCOPY  03/10/2012   Procedure: COLONOSCOPY;  Surgeon: Danie Binder, MD;  Location: [facility] ENDO SUITE;  Service: Endoscopy;  Laterality: N/A;  10:30 AM  . THYROIDECTOMY  08/20/2012   Procedure: THYROIDECTOMY;  Surgeon: Jamesetta So, MD;  Location: [facility] ORS;  Service: General;  Laterality: N/A;  Total Thyroidectomy   Social History   Socioeconomic History  . Marital status: Married    Spouse name: Not on file  . Number of children: Not on file  . Years of education: Not on file  . Highest education level: Not on file  Occupational History  . Not on file  Social Needs  . Financial resource strain: Not on file  . Food insecurity:    Worry: Not on file    Inability: Not on file  . Transportation needs:    Medical: Not on file    Non-medical: Not on file  Tobacco Use  . Smoking status: Never Smoker  . Smokeless tobacco: Never Used  Substance and Sexual Activity  . Alcohol use: No  . Drug use: No  . Sexual activity: Yes    Birth control/protection: Surgical    Comment: hysterectomy  Lifestyle  . Physical activity:    Days per week: Not on file    Minutes per session: Not on file  . Stress: Not on file  Relationships  . Social connections:    Talks on phone: Not on file    Gets together: Not on file    Attends religious service: Not on file    Active member of club or organization: Not on file    Attends meetings of  clubs or organizations: Not on file    Relationship status: Not on file  Other Topics Concern  . Not on file  Social History Narrative  . Not on file   Outpatient Encounter Medications as of 09/19/2018  Medication Sig  . aspirin EC 81 MG tablet Take 81 mg by mouth daily.  . fluticasone (FLONASE) 50 MCG/ACT nasal spray Place 2 sprays into the nose daily as needed. For allergies  . gabapentin (NEURONTIN) 100 MG capsule   . levothyroxine (SYNTHROID, LEVOTHROID) 112 MCG tablet TAKE (1) TABLET BY MOUTH EACH MORNING, WAIT 1/2 HOUR BEFORE EATING BREAKFAST OR TAKING OTHER MEDICATIONS.  Marland Kitchen lidocaine (LIDODERM) 5 % PLACE 1 PATCH ONTO THE SKIN ONCE DAILY. REMOVE USED PATCH AFTER 12 HOURS. (Patient taking differently: PLACE 1 PATCH ONTO THE SKIN ONCE DAILY PRN. REMOVE USED PATCH AFTER 12 HOURS.)  . loratadine (CLARITIN) 10 MG tablet Take 10 mg by mouth daily as needed. For allergies  . meloxicam (MOBIC) 15 MG tablet Take 15 mg by mouth daily.  . metFORMIN (GLUCOPHAGE) 500 MG tablet Take 1 tablet (500 mg total) by mouth daily.  . Olmesartan-Amlodipine-HCTZ (TRIBENZOR) 40-5-25 MG TABS  Take 1 tablet by mouth daily.    Marland Kitchen triamcinolone cream (KENALOG) 0.1 % 1 application as needed.   . Vitamin D, Ergocalciferol, (DRISDOL) 1.25 MG (50000 UT) CAPS capsule   . [DISCONTINUED] calcium-vitamin D (OSCAL 500/200 D-3) 500-200 MG-UNIT per tablet Take 1 tablet by mouth 2 (two) times daily. (Patient taking differently: Take 1 tablet by mouth daily. )   No facility-administered encounter medications on file as of 09/19/2018.    ALLERGIES: Allergies  Allergen Reactions  . Sulfonamide Derivatives Hives   VACCINATION STATUS: Immunization History  Administered Date(s) Administered  . Influenza,inj,Quad PF,6+ Mos 04/18/2018    HPI -63 year old female with medical history as above.   -She is here to follow-up for her history of papillary thyroid cancer, postsurgical hypothyroidism, hypocalcemia, prediabetes. - she  underwent thyroidectomy for papillary thyroid cancer in February 2014,  and rTSH stimulated RAI thyroid remnant ablation on 09/05/2012.  her pathology revealed multifocal 1.2, and 0.3 cms follicular variant PTC.  Her surveillance neck ultrasound is negative for any residual thyroid tissue x2, last neck sonogram in 2017.  Second surveillance study with rTSH WBS showed a "focus of abnormal tracer accumulation in mid chest at the midline highly suspicious for metastatic disease potentially to a mediastinal lymph node". She was sent for CT of thorax/neck with contrast which she underwent on 03/18/2015. This was reported as unremarkable, might have been artifactual. -Subsequent surveillance study with rTSH stimulation showed absence of the previously noticed abnormal tracer accumulation in the mid chest.  - Her most recent whole-body scan with Thyrogen reveals no evidence of distant iodine avid metastasis in November 2018. -Her surveillance thyroid/neck ultrasound on June 24, 2018 was negative for any residual thyroid tissue or recurrence.    She is on levothyroxine 125 mcg po qday. She is also on calcium supplement.  She denies dysphagia, SOB, nor voice change.  -She continues to feel better.  She lost 10 pounds since last visit.  She has no new complaints today.   -She denies any family hx of thyroid dysfunction nor cancer.  -She is on metformin 500 mg p.o. daily at breakfast for prediabetes.  Review of Systems  Constitutional:  + Steady weight,  no fatigue, no subjective hyperthermia/hypothermia Eyes: no blurry vision, no xerophthalmia ENT: no sore throat, no nodules palpated in throat, no dysphagia/odynophagia, no hoarseness Cardiovascular: no chest pain, no palpitations.   Gastrointestinal: no nausea, no diarrhea.   Musculoskeletal: no muscle/joint aches Skin: no rashes Neurological: no tremors/numbness/tingling/dizziness Psychiatric: no depression/anxiety  Objective:    BP (!)  142/88   Pulse 71   Ht '5\' 5"'  (1.651 m)   Wt 231 lb 12.8 oz (105.1 kg)   BMI 38.57 kg/m   Wt Readings from Last 3 Encounters:  09/19/18 231 lb 12.8 oz (105.1 kg)  06/18/18 232 lb (105.2 kg)  12/16/17 230 lb (104.3 kg)    Physical Exam  Constitutional: + Obese, not in acute distress.   Eyes: PERRLA, EOMI, no exophthalmos ENT: moist mucous membranes, + post thyroidectomy scar on anterior lower neck, no  cervical lymphadenopathy Musculoskeletal: no deformities, strength intact in all 4 extremities.   Skin: moist, warm, no rashes Neurological: no tremor with outstretched hands, DTR normal in all 4  Complete Blood Count (Most recent): Lab Results  Component Value Date   WBC 6.3 08/21/2012   HGB 10.0 (L) 08/21/2012   HCT 29.9 (L) 08/21/2012   MCV 81.7 08/21/2012   PLT 241 08/21/2012    Recent Results (from the  past 2160 hour(s))  Microalbumin, urine     Status: None   Collection Time: 08/27/18 12:00 AM  Result Value Ref Range   Microalb, Ur 30   VITAMIN D 25 Hydroxy (Vit-D Deficiency, Fractures)     Status: None   Collection Time: 08/27/18 12:00 AM  Result Value Ref Range   Vit D, 25-Hydroxy 09.3   Basic metabolic panel     Status: Abnormal   Collection Time: 08/27/18 12:00 AM  Result Value Ref Range   BUN 26 (A) 4 - 21   Creatinine 1.1 0.5 - 1.1  Lipid panel     Status: Abnormal   Collection Time: 08/27/18 12:00 AM  Result Value Ref Range   Triglycerides 80 40 - 160   Cholesterol 205 (A) 0 - 200   HDL 76 (A) 35 - 70   LDL Cholesterol 113   Hemoglobin A1c     Status: None   Collection Time: 08/27/18 12:00 AM  Result Value Ref Range   Hemoglobin A1C 6.1     Chemistry (most recent): Lab Results  Component Value Date   NA 138 12/06/2016   K 4.7 12/06/2016   CL 102 12/06/2016   CO2 30 12/06/2016   BUN 26 (A) 08/27/2018   CREATININE 1.1 08/27/2018   Results for LACHLAN, PELTO (MRN [id number]) as of 09/19/2018 09:31  Ref. Range 12/06/2017 09:09 06/09/2018 10:13   TSH Latest Ref Range: 0.40 - 4.50 mIU/L 0.22 (L) 0.64  T4,Free(Direct) Latest Ref Range: 0.8 - 1.8 ng/dL 2.2 (H) 1.7  Thyroglobulin Latest Units: ng/mL  0.1 (L)  Thyroglobulin Ab Latest Ref Range: < or = 1 IU/mL  <1   Calcium 10.5 above target on August 27, 2018   Thyroid/neck ultrasound on June 24, 2018 No discrete nodules are seen within the thyroidectomy bed.  IMPRESSION: No residual or recurrent tissue post total thyroidectomy.  Assessment & Plan:   1. Malignant neoplasm of thyroid gland (HCC)  - She has had multifocal FVPTC ( 1.2 and 0.3 cms), stage 1 , with no evidence of lymphovascular invasion. She is s/p total thyroidectomy February 2014. She is s/p rTSH stimulated RAI remnant ablation with post therapy scan which is negative for distant spread on 09/05/2012. Her stimulated TG level was 1.9 along with <20 Tg Abs.  her surveillance thyroid u/s is negative for any residual thyroid tissue x 1. Her last neck/ thyroid ultrasound is s/f surgically absent thyroid .  Second surveillance study with rTSH WBS on 02/18/2015 showed a "focus of abnormal tracer accumulation in mid chest at the midline highly suspicious for metastatic disease potentially to a mediastinal lymph node". - CT with contrast of her chest and neck were unremarkable, might have been artifactual.     Her  rTSH stimulated whole-body scan on 12/09/2015 is reported as unremarkable,  Specifically, focus of abnormal uptake seen within the mid chest on the previous exam is no longer identified.  -  thyroglobulin level is 0.1 improving from 1.9 ,  with antithyroglobulin antibodies at <0.1.  -  Her surveillance thyroid/neck ultrasound on 04/03/2016 was unremarkable,  showing surgically absent thyroid. - Her most recent Thyrogen stimulated whole-body scan from 06/14/2017 and thyroglobulin level before this visit where reassuring with no evidence of distant iodine avid metastasis. -Her previsit unstimulated  thyroglobulin level is 0.1 along with thyroglobulin antibodies. June 24, 2018 surveillance thyroid/neck ultrasound: No residual or recurrent tissue post total thyroidectomy.  2. Postsurgical hypothyroidism - Post surgical hypothyroidism on suppressive therapy: Her  thyroid function tests are consistent with appropriate suppression with TSH of 0.6, free T4 of 1.7 . -She is advised to continue levothyroxine 112 mcg p.o. daily after an hour before breakfast.      - We discussed about the correct intake of her thyroid hormone, on empty stomach at fasting, with water, separated by at least 30 minutes from breakfast and other medications,  and separated by more than 4 hours from calcium, iron, multivitamins, acid reflux medications (PPIs). -Patient is made aware of the fact that thyroid hormone replacement is needed for life, dose to be adjusted by periodic monitoring of thyroid function tests.   3.  hypertension -Her blood pressure is not controlled to target.  She did not take her blood pressure medications this morning.  She is advised to be consistent on taking her blood pressure medications the morning with breakfast, Tribenzor 40-5-20 5 mg p.o. daily.    4. Pre-diabetes -Her most recent A1c was 6.1 %.  She is tolerating low-dose metformin  549m po qday, will continue metformin 500 mg p.o daily after breakfast.. .  - Patient admits there is a room for improvement in her diet and drink choices. -  Suggestion is made for her to avoid simple carbohydrates  from her diet including Cakes, Sweet Desserts / Pastries, Ice Cream, Soda (diet and regular), Sweet Tea, Candies, Chips, Cookies, Store Bought Juices, Alcohol in Excess of  1-2 drinks a day, Artificial Sweeteners, and "Sugar-free" Products. This will help patient to have stable blood glucose profile and potentially avoid unintended weight gain.  5) hypercalcemia : Her calcium is now above target at 10.5.  She is advised to discontinue calcium  supplements.  However she will need vitamin D supplement.   -I discussed and prescribed vitamin D3 2000 units daily supplement.  - I advised patient to maintain close follow up with GSharilyn Sites MD for primary care needs.  - Time spent with the patient: 25 min, of which >50% was spent in reviewing her  current and  previous labs/studies, previous treatments, and medications doses and developing a plan for long-term care based on the latest recommendations for standards of care. BGeoffery Lyonsparticipated in the discussions, expressed understanding, and voiced agreement with the above plans.  All questions were answered to her satisfaction. she is encouraged to contact clinic should she have any questions or concerns prior to her return visit.   Follow up plan: Return in about 6 months (around 03/22/2019) for Follow up with Pre-visit Labs.  GGlade Lloyd MD Phone: [phone] Fax: [phone] -  This note was partially dictated with voice recognition software. Similar sounding words can be transcribed inadequately or may not  be corrected upon review.  09/19/2018, 10:42 AM

## 2018-09-19 NOTE — Progress Notes (Signed)
Sophia Wilson, CMA  

## 2018-09-19 NOTE — Patient Instructions (Signed)

## 2019-02-26 ENCOUNTER — Encounter: Payer: Self-pay | Admitting: "Endocrinology

## 2019-03-12 ENCOUNTER — Other Ambulatory Visit: Payer: Self-pay

## 2019-03-12 MED ORDER — LEVOTHYROXINE SODIUM 112 MCG PO TABS: ORAL_TABLET | ORAL | 1 refills | Status: DC

## 2019-03-16 DIAGNOSIS — R7303 Prediabetes: Secondary | ICD-10-CM | POA: Diagnosis not present

## 2019-03-16 DIAGNOSIS — C73 Malignant neoplasm of thyroid gland: Secondary | ICD-10-CM | POA: Diagnosis not present

## 2019-03-16 DIAGNOSIS — E890 Postprocedural hypothyroidism: Secondary | ICD-10-CM | POA: Diagnosis not present

## 2019-03-17 LAB — T4, FREE: Free T4: 1.7 ng/dL (ref 0.8–1.8)

## 2019-03-17 LAB — COMPLETE METABOLIC PANEL WITH GFR
AG Ratio: 1.2 (calc) (ref 1.0–2.5)
ALT: 9 U/L (ref 6–29)
AST: 17 U/L (ref 10–35)
Albumin: 4 g/dL (ref 3.6–5.1)
Alkaline phosphatase (APISO): 65 U/L (ref 37–153)
BUN: 23 mg/dL (ref 7–25)
CO2: 32 mmol/L (ref 20–32)
Calcium: 9.6 mg/dL (ref 8.6–10.4)
Chloride: 102 mmol/L (ref 98–110)
Creat: 0.88 mg/dL (ref 0.50–0.99)
GFR, Est African American: 82 mL/min/{1.73_m2} (ref >=60)
GFR, Est Non African American: 70 mL/min/{1.73_m2} (ref >=60)
Globulin: 3.3 g/dL (calc) (ref 1.9–3.7)
Glucose, Bld: 95 mg/dL (ref 65–99)
Potassium: 4 mmol/L (ref 3.5–5.3)
Sodium: 140 mmol/L (ref 135–146)
Total Bilirubin: 0.4 mg/dL (ref 0.2–1.2)
Total Protein: 7.3 g/dL (ref 6.1–8.1)

## 2019-03-17 LAB — TSH: TSH: 0.67 mIU/L (ref 0.40–4.50)

## 2019-03-17 LAB — THYROGLOBULIN LEVEL: Thyroglobulin: <0.1 ng/mL — ABNORMAL LOW

## 2019-03-17 LAB — HEMOGLOBIN A1C
Hgb A1c MFr Bld: 6.1 % of total Hgb — ABNORMAL HIGH (ref <5.7)
Mean Plasma Glucose: 128 (calc)
eAG (mmol/L): 7.1 (calc)

## 2019-03-17 LAB — THYROGLOBULIN ANTIBODY: Thyroglobulin Ab: <1 IU/mL (ref <=1)

## 2019-03-24 ENCOUNTER — Other Ambulatory Visit: Payer: Self-pay

## 2019-03-24 ENCOUNTER — Ambulatory Visit (INDEPENDENT_AMBULATORY_CARE_PROVIDER_SITE_OTHER): Payer: BC Managed Care – PPO | Admitting: "Endocrinology

## 2019-03-24 ENCOUNTER — Encounter: Payer: Self-pay | Admitting: "Endocrinology

## 2019-03-24 DIAGNOSIS — I10 Essential (primary) hypertension: Secondary | ICD-10-CM | POA: Diagnosis not present

## 2019-03-24 DIAGNOSIS — C73 Malignant neoplasm of thyroid gland: Secondary | ICD-10-CM

## 2019-03-24 DIAGNOSIS — E890 Postprocedural hypothyroidism: Secondary | ICD-10-CM

## 2019-03-24 DIAGNOSIS — E559 Vitamin D deficiency, unspecified: Secondary | ICD-10-CM

## 2019-03-24 DIAGNOSIS — R7303 Prediabetes: Secondary | ICD-10-CM

## 2019-03-24 NOTE — Progress Notes (Signed)
03/24/2019                                Endocrinology Telehealth Visit Follow up Note -During COVID -19 Pandemic  I connected with Sophia Wilson on 03/24/2019   by telephone and verified that I am speaking with the correct person using two identifiers. Sophia Wilson, [birth year]. she has verbally consented to this visit. All issues noted in this document were discussed and addressed. The format was not optimal for physical exam.   Subjective:    Patient ID: Sophia Wilson, female    DOB: [birth year], PCP Sharilyn Sites, MD   Past Medical History:  Diagnosis Date  . Anemia   . Cancer [facility])    thyroid cancer  . Collagen vascular disease ([place])   . Eczema   . Fibroid tumor   . GERD (gastroesophageal reflux disease)   . H/O seasonal allergies   . Herpes simplex without mention of complication    rt thigh  . Hypertension   . Thyroid disease    cancer  . Vitiligo    Past Surgical History:  Procedure Laterality Date  . ABDOMINAL HYSTERECTOMY    . COLONOSCOPY  03/10/2012   Procedure: COLONOSCOPY;  Surgeon: Danie Binder, MD;  Location: [facility];  Service: Endoscopy;  Laterality: N/A;  10:30 AM  . THYROIDECTOMY  08/20/2012   Procedure: THYROIDECTOMY;  Surgeon: Jamesetta So, MD;  Location: [facility] ORS;  Service: General;  Laterality: N/A;  Total Thyroidectomy   Social History   Socioeconomic History  . Marital status: Married    Spouse name: Not on file  . Number of children: Not on file  . Years of education: Not on file  . Highest education level: Not on file  Occupational History  . Not on file  Social Needs  . Financial resource strain: Not on file  . Food insecurity    Worry: Not on file    Inability: Not on file  . Transportation needs    Medical: Not on file    Non-medical: Not on file  Tobacco Use  . Smoking status: Never Smoker  . Smokeless tobacco: Never Used  Substance and Sexual Activity  . Alcohol use: No  . Drug use: No  . Sexual activity: Yes     Birth control/protection: Surgical    Comment: hysterectomy  Lifestyle  . Physical activity    Days per week: Not on file    Minutes per session: Not on file  . Stress: Not on file  Relationships  . Social Herbalist on phone: Not on file    Gets together: Not on file    Attends religious service: Not on file    Active member of club or organization: Not on file    Attends meetings of clubs or organizations: Not on file    Relationship status: Not on file  Other Topics Concern  . Not on file  Social History Narrative  . Not on file   Outpatient Encounter Medications as of 03/24/2019  Medication Sig  . aspirin EC 81 MG tablet Take 81 mg by mouth daily.  . Cholecalciferol 50 MCG (2000 UT) CAPS Take 1 capsule (2,000 Units total) by mouth daily with breakfast.  . fluticasone (FLONASE) 50 MCG/ACT nasal spray Place 2 sprays into the nose daily as needed. For allergies  . gabapentin (NEURONTIN) 100 MG capsule   .  levothyroxine (SYNTHROID) 112 MCG tablet TAKE (1) TABLET BY MOUTH EACH MORNING, WAIT 1/2 HOUR BEFORE EATING BREAKFAST OR TAKING OTHER MEDICATIONS.  Marland Kitchen lidocaine (LIDODERM) 5 % PLACE 1 PATCH ONTO THE SKIN ONCE DAILY. REMOVE USED PATCH AFTER 12 HOURS. (Patient taking differently: PLACE 1 PATCH ONTO THE SKIN ONCE DAILY PRN. REMOVE USED PATCH AFTER 12 HOURS.)  . loratadine (CLARITIN) 10 MG tablet Take 10 mg by mouth daily as needed. For allergies  . meloxicam (MOBIC) 15 MG tablet Take 15 mg by mouth daily.  . metFORMIN (GLUCOPHAGE) 500 MG tablet Take 1 tablet (500 mg total) by mouth daily.  . Olmesartan-Amlodipine-HCTZ (TRIBENZOR) 40-5-25 MG TABS Take 1 tablet by mouth daily.    Marland Kitchen triamcinolone cream (KENALOG) 0.1 % 1 application as needed.   . Vitamin D, Ergocalciferol, (DRISDOL) 1.25 MG (50000 UT) CAPS capsule    No facility-administered encounter medications on file as of 03/24/2019.    ALLERGIES: Allergies  Allergen Reactions  . Sulfonamide Derivatives Hives    VACCINATION STATUS: Immunization History  Administered Date(s) Administered  . Influenza,inj,Quad PF,6+ Mos 04/18/2018    HPI -63 year old female with medical history as above.   -She is here to follow-up for her history of papillary thyroid cancer, postsurgical hypothyroidism, hypocalcemia, prediabetes. - she underwent thyroidectomy for papillary thyroid cancer in February 2014,  and rTSH stimulated RAI thyroid remnant ablation on 09/05/2012.  her pathology revealed multifocal 1.2, and 0.3 cms follicular variant PTC.  Her surveillance neck ultrasound is negative for any residual thyroid tissue x2, last neck sonogram in 2017.  Second surveillance study with rTSH WBS showed a "focus of abnormal tracer accumulation in mid chest at the midline highly suspicious for metastatic disease potentially to a mediastinal lymph node". She was sent for CT of thorax/neck with contrast which she underwent on 03/18/2015. This was reported as unremarkable, might have been artifactual. -Subsequent surveillance study with rTSH stimulation showed absence of the previously noticed abnormal tracer accumulation in the mid chest.  - Her most recent whole-body scan with Thyrogen reveals no evidence of distant iodine avid metastasis in November 2018. -Her surveillance thyroid/neck ultrasound on June 24, 2018 was negative for any residual thyroid tissue or recurrence.    She is on levothyroxine 112 mcg po qday. She is also on calcium supplements.  She denies dysphagia, SOB, nor voice change.  -She continues to feel better.  She lost 10 pounds since last visit.  She has no new complaints today.   -She denies any family hx of thyroid dysfunction nor cancer.  -She is on metformin 500 mg p.o. daily at breakfast for prediabetes.  Review of Systems Limited as above.  Objective:    There were no vitals taken for this visit.  Wt Readings from Last 3 Encounters:  09/19/18 231 lb 12.8 oz (105.1 kg)  06/18/18 232  lb (105.2 kg)  12/16/17 230 lb (104.3 kg)    complete Blood Count (Most recent): Lab Results  Component Value Date   WBC 6.3 08/21/2012   HGB 10.0 (L) 08/21/2012   HCT 29.9 (L) 08/21/2012   MCV 81.7 08/21/2012   PLT 241 08/21/2012    Recent Results (from the past 2160 hour(s))  Hemoglobin A1c     Status: Abnormal   Collection Time: 03/16/19 10:27 AM  Result Value Ref Range   Hgb A1c MFr Bld 6.1 (H) <5.7 % of total Hgb    Comment: For someone without known diabetes, a hemoglobin  A1c value between 5.7% and 6.4% is  consistent with prediabetes and should be confirmed with a  follow-up test. . For someone with known diabetes, a value <7% indicates that their diabetes is well controlled. A1c targets should be individualized based on duration of diabetes, age, comorbid conditions, and other considerations. . This assay result is consistent with an increased risk of diabetes. . Currently, no consensus exists regarding use of hemoglobin A1c for diagnosis of diabetes for children. .    Mean Plasma Glucose 128 (calc)   eAG (mmol/L) 7.1 (calc)  COMPLETE METABOLIC PANEL WITH GFR     Status: None   Collection Time: 03/16/19 10:27 AM  Result Value Ref Range   Glucose, Bld 95 65 - 99 mg/dL    Comment: .            Fasting reference interval .    BUN 23 7 - 25 mg/dL   Creat 0.88 0.50 - 0.99 mg/dL    Comment: For patients >2 years of age, the reference limit for Creatinine is approximately 13% higher for people identified as African-American. .    GFR, Est Non African American 70 > OR = 60 mL/min/1.53m   GFR, Est African American 82 > OR = 60 mL/min/1.768m  BUN/Creatinine Ratio NOT APPLICABLE 6 - 22 (calc)   Sodium 140 135 - 146 mmol/L   Potassium 4.0 3.5 - 5.3 mmol/L   Chloride 102 98 - 110 mmol/L   CO2 32 20 - 32 mmol/L   Calcium 9.6 8.6 - 10.4 mg/dL   Total Protein 7.3 6.1 - 8.1 g/dL   Albumin 4.0 3.6 - 5.1 g/dL   Globulin 3.3 1.9 - 3.7 g/dL (calc)   AG Ratio 1.2  1.0 - 2.5 (calc)   Total Bilirubin 0.4 0.2 - 1.2 mg/dL   Alkaline phosphatase (APISO) 65 37 - 153 U/L   AST 17 10 - 35 U/L   ALT 9 6 - 29 U/L  TSH     Status: None   Collection Time: 03/16/19 10:27 AM  Result Value Ref Range   TSH 0.67 0.40 - 4.50 mIU/L  T4, free     Status: None   Collection Time: 03/16/19 10:27 AM  Result Value Ref Range   Free T4 1.7 0.8 - 1.8 ng/dL  Thyroglobulin antibody     Status: None   Collection Time: 03/16/19 10:27 AM  Result Value Ref Range   Thyroglobulin Ab <1 < or = 1 IU/mL  Thyroglobulin Level     Status: Abnormal   Collection Time: 03/16/19 10:27 AM  Result Value Ref Range   Thyroglobulin <0.1 (L) ng/mL    Comment:       Reference Range:       Intact Thyroid   2.8-40.9       Athyrotic        <0.1 .       Note: Abnormal flagging is based       on the reference interval for        patients with intact thyroid. . . This test was performed using the Beckman Coulter  chemiluminescent method. Values obtained from different assay methods cannot be used interchangeably. Thyroglobulin levels, regardless of value, should not be interpreted as absolute evidence of the presence or absence of disease. .    Comment      Comment: . Thyroglobulin antibodies (TGAB) interfere with thyroglobulin (TG) assays; therefore, TGAB assay should always be performed in conjunction with a TG assay. . . For additional information, please refer to  http://education.questdiagnostics.com/faq/FAQ202  (This link is being provided for informational/ educational purposes only.) .    Results for NOHEALANI, MEDINGER (MRN [id number]) as of 03/24/2019 11:49  Ref. Range 03/16/2019 10:27  TSH Latest Ref Range: 0.40 - 4.50 mIU/L 0.67  T4,Free(Direct) Latest Ref Range: 0.8 - 1.8 ng/dL 1.7  Thyroglobulin Latest Units: ng/mL <0.1 (L)  Thyroglobulin Ab Latest Ref Range: < or = 1 IU/mL <1   Calcium 10.5 above target on August 27, 2018   Thyroid/neck ultrasound on June 24, 2018 No discrete nodules are seen within the thyroidectomy bed.  IMPRESSION: No residual or recurrent tissue post total thyroidectomy.  Assessment & Plan:   1. Malignant neoplasm of thyroid gland (HCC)  - She has had multifocal FVPTC ( 1.2 and 0.3 cms), stage 1 , with no evidence of lymphovascular invasion. She is s/p total thyroidectomy February 2014. She is s/p rTSH stimulated RAI remnant ablation with post therapy scan which is negative for distant spread on 09/05/2012. Her stimulated TG level was 1.9 along with <20 Tg Abs.  her surveillance thyroid u/s is negative for any residual thyroid tissue x 1. Her last neck/ thyroid ultrasound is s/f surgically absent thyroid .  Second surveillance study with rTSH WBS on 02/18/2015 showed a "focus of abnormal tracer accumulation in mid chest at the midline highly suspicious for metastatic disease potentially to a mediastinal lymph node". - CT with contrast of her chest and neck were unremarkable, might have been artifactual.     Her  rTSH stimulated whole-body scan on 12/09/2015 is reported as unremarkable,  Specifically, focus of abnormal uptake seen within the mid chest on the previous exam is no longer identified.  -  thyroglobulin level is 0.1 improving from 1.9 ,  with antithyroglobulin antibodies at <0.1.  -  Her surveillance thyroid/neck ultrasound on 04/03/2016 was unremarkable,  showing surgically absent thyroid. - Her most recent Thyrogen stimulated whole-body scan from 06/14/2017 and thyroglobulin level before this visit where reassuring with no evidence of distant iodine avid metastasis. -Her previsit unstimulated thyroglobulin level is 0.1 along with thyroglobulin antibodies. June 24, 2018 surveillance thyroid/neck ultrasound: No residual or recurrent tissue post total thyroidectomy.  -She will be considered for Thyrogen stimulated whole-body scan before her next visit in 6 months.  2. Postsurgical hypothyroidism -  Post surgical hypothyroidism on suppressive therapy: Her thyroid function tests are consistent with appropriate suppression with TSH of 0.6, free T4 of 1.7 . -She is advised to continue levothyroxine 112 mcg daily before breakfast.      - We discussed about the correct intake of her thyroid hormone, on empty stomach at fasting, with water, separated by at least 30 minutes from breakfast and other medications,  and separated by more than 4 hours from calcium, iron, multivitamins, acid reflux medications (PPIs). -Patient is made aware of the fact that thyroid hormone replacement is needed for life, dose to be adjusted by periodic monitoring of thyroid function tests.    3.  hypertension -Her blood pressure is not controlled to target.  She did not take her blood pressure medications this morning.  She is advised to be consistent on taking her blood pressure medications the morning with breakfast, Tribenzor 40-5-20 5 mg p.o. daily.    4. Pre-diabetes -Her most recent A1c was 6.1 %.  She is tolerating low-dose metformin 500 mg p.o. daily, advised to continue.   - she  admits there is a room for improvement in her diet and drink choices. -  Suggestion is made for her to avoid simple carbohydrates  from her diet including Cakes, Sweet Desserts / Pastries, Ice Cream, Soda (diet and regular), Sweet Tea, Candies, Chips, Cookies, Sweet Pastries,  Store Bought Juices, Alcohol in Excess of  1-2 drinks a day, Artificial Sweeteners, Coffee Creamer, and "Sugar-free" Products. This will help patient to have stable blood glucose profile and potentially avoid unintended weight gain.   5) hypercalcemia : Her calcium is now above target at 10.5.  She is advised to discontinue calcium supplements.  However she will need vitamin D supplement.   -I discussed and prescribed vitamin D3 2000 units daily supplement.  - I advised patient to maintain close follow up with Sharilyn Sites, MD for primary care needs.  -  Patient Care Time Today:  25 min, of which >50% was spent in  counseling and the rest reviewing her  current and  previous labs/studies, previous treatments, her blood glucose readings, and medications' doses and developing a plan for long-term care based on the latest recommendations for standards of care.   Sophia Wilson participated in the discussions, expressed understanding, and voiced agreement with the above plans.  All questions were answered to her satisfaction. she is encouraged to contact clinic should she have any questions or concerns prior to her return visit.    Follow up plan: Return in about 6 months (around 09/21/2019), or whole body scan in feb 2021, for Follow up with Pre-visit Labs, Follow up with Whole Body Scan w/Thyrogen.  Glade Lloyd, MD Phone: [phone]  Fax: [phone]  -  This note was partially dictated with voice recognition software. Similar sounding words can be transcribed inadequately or may not  be corrected upon review.  03/24/2019, 11:46 AM

## 2019-04-16 ENCOUNTER — Other Ambulatory Visit: Payer: Self-pay | Admitting: "Endocrinology

## 2019-05-06 ENCOUNTER — Other Ambulatory Visit (HOSPITAL_COMMUNITY): Payer: Self-pay | Admitting: Family Medicine

## 2019-05-06 DIAGNOSIS — Z1231 Encounter for screening mammogram for malignant neoplasm of breast: Secondary | ICD-10-CM

## 2019-05-12 LAB — HM DIABETES EYE EXAM

## 2019-05-17 IMAGING — NM NM [ID] THYROID CANCER METS WHOLE BODY W/ THYROGEN
4 series · 4 of 4 positions shown · non-contrast
Comparison: 12/09/2015

CLINICAL DATA: Thyroid cancer post thyroidectomy and postoperative
radioactive iodine ablation

EXAM:
THYROGEN-STIMULATED 3-303 WHOLE BODY SCAN
TECHNIQUE: The patient received 0.9 mg Thyrogen intramuscularly every 24 hours
for two doses. On the third day the patient returned and received
the radiopharmaceutical, per orally. On the fifth day, the patient
returned and whole body planar images were obtained in the anterior
and posterior projections.
RADIOPHARMACEUTICALS:  4.04 mCi 3-303 sodium iodide orally

[Series 1: i131 whole body · 2.66mm/px · 1 of 1 slices shown (1 of 2)]
[im 1/1  full-range]
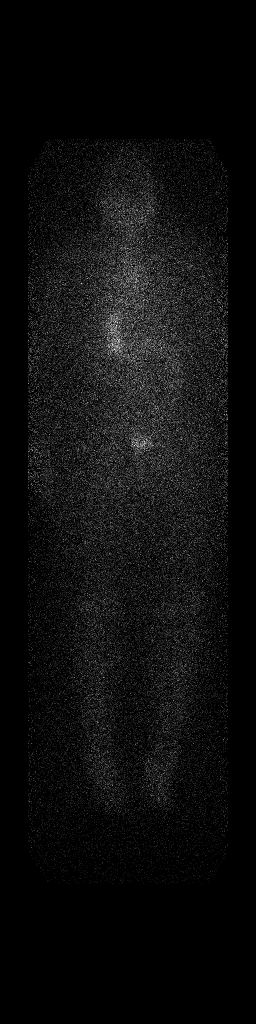

[Series 1: i131 whole body · 2.66mm/px · 1 of 1 slices shown (2 of 2)]
[im 1/1  full-range]
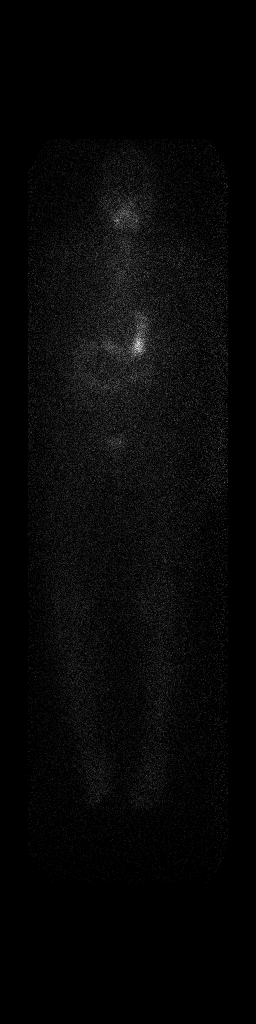

[Series 2: static with marker · 4.14mm/px · 1 of 1 slices shown]
[im 1/1]
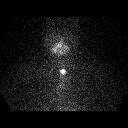

[Series 3: static without marker · non-contrast · 4.14mm/px · 1 of 1 slices shown]
[im 1/1  full-range]
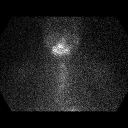

[4 of 4 positions shown; findings below may reference images not displayed]

FINDINGS: Excreted iodine within stomach and intestines similar to prior exam.

Excreted iodine within urinary bladder.

No residual radio iodine accumulation at the cervical region.

Faint radiotracer at the mediastinum, less focal than on the
02/18/2015 exam ; no mediastinal mass or adenopathy was identified
on a follow-up CT chest exam in 3603, question artifact from
swallowed salivary tracer within esophagus.

No additional sites of abnormal radio iodine accumulation
identified.
IMPRESSION: No definite scintigraphic evidence of iodine-avid metastatic thyroid
cancer.

## 2019-06-08 ENCOUNTER — Ambulatory Visit (HOSPITAL_COMMUNITY): Payer: BC Managed Care – PPO

## 2019-06-08 ENCOUNTER — Telehealth: Payer: Self-pay | Admitting: Obstetrics and Gynecology

## 2019-06-08 NOTE — Telephone Encounter (Signed)

## 2019-06-09 ENCOUNTER — Ambulatory Visit (INDEPENDENT_AMBULATORY_CARE_PROVIDER_SITE_OTHER): Payer: BC Managed Care – PPO | Admitting: Obstetrics and Gynecology

## 2019-06-09 ENCOUNTER — Encounter: Payer: Self-pay | Admitting: Obstetrics and Gynecology

## 2019-06-09 ENCOUNTER — Other Ambulatory Visit: Payer: Self-pay

## 2019-06-09 DIAGNOSIS — Z01419 Encounter for gynecological examination (general) (routine) without abnormal findings: Secondary | ICD-10-CM

## 2019-06-09 DIAGNOSIS — C73 Malignant neoplasm of thyroid gland: Secondary | ICD-10-CM

## 2019-06-09 NOTE — Progress Notes (Signed)
Patient ID: Sophia Wilson, female   DOB: 10-Aug-1955, 63 y.o.   MRN: [id number]  Assessment:  Annual Gyn Exam Chronic BLE edema Plan:  1. pap smear NOT done, next physical due 1 year by primary care, no gyn eval required, offered to see q 3 yr. 2. return  Prn symptoms 3.  3    Annual mammogram advised  Subjective:  Sophia Wilson is a 63 y.o. female [id number] who presents for annual exam. No LMP recorded. Patient has had a hysterectomy. The patient has complaints today of None. She had an abdominal hysterectomy by Dr. Heide Spark in 96 due to fibroids. She has chronic BLE edema. She has a mammogram scheduled for next month. She had thyroidectomy 08/20/2012 due to cancer. She hasn't had any problems.   The following portions of the patient's history were reviewed and updated as appropriate: allergies, current medications, past family history, past medical history, past social history, past surgical history and problem list. Past Medical History:  Diagnosis Date  . Anemia   . Cancer [facility])    thyroid cancer  . Collagen vascular disease ([place])   . Eczema   . Fibroid tumor   . GERD (gastroesophageal reflux disease)   . H/O seasonal allergies   . Herpes simplex without mention of complication    rt thigh  . Hypertension   . Thyroid disease    cancer  . Vitiligo     Past Surgical History:  Procedure Laterality Date  . ABDOMINAL HYSTERECTOMY    . COLONOSCOPY  03/10/2012   Procedure: COLONOSCOPY;  Surgeon: Danie Binder, MD;  Location[facility];  Service: Endoscopy;  Laterality: N/A;  10:30 AM  . THYROIDECTOMY  08/20/2012   Procedure: THYROIDECTOMY;  Surgeon: Jamesetta So, MD;  Location: [facility] ORS;  Service: General;  Laterality: N/A;  Total Thyroidectomy     Current Outpatient Medications:  .  aspirin EC 81 MG tablet, Take 81 mg by mouth daily., Disp: , Rfl:  .  [employer] D3 50 MCG (2000 UT) CAPS, TAKE 1 CAPSULE (2,000 UNITS TOTAL) BY MOUTH DAILY WITH BREAKFAST., Disp: 100 capsule, Rfl:  2 .  fluticasone (FLONASE) 50 MCG/ACT nasal spray, Place 2 sprays into the nose daily as needed. For allergies, Disp: , Rfl:  .  gabapentin (NEURONTIN) 100 MG capsule, , Disp: , Rfl:  .  levothyroxine (SYNTHROID) 112 MCG tablet, TAKE (1) TABLET BY MOUTH EACH MORNING, WAIT 1/2 HOUR BEFORE EATING BREAKFAST OR TAKING OTHER MEDICATIONS., Disp: 90 tablet, Rfl: 1 .  lidocaine (LIDODERM) 5 %, PLACE 1 PATCH ONTO THE SKIN ONCE DAILY. REMOVE USED PATCH AFTER 12 HOURS. (Patient taking differently: PLACE 1 PATCH ONTO THE SKIN ONCE DAILY PRN. REMOVE USED PATCH AFTER 12 HOURS.), Disp: 30 patch, Rfl: 3 .  loratadine (CLARITIN) 10 MG tablet, Take 10 mg by mouth daily as needed. For allergies, Disp: , Rfl:  .  meloxicam (MOBIC) 15 MG tablet, Take 15 mg by mouth daily., Disp: , Rfl:  .  metFORMIN (GLUCOPHAGE) 500 MG tablet, Take 1 tablet (500 mg total) by mouth daily., Disp: 90 tablet, Rfl: 2 .  Olmesartan-Amlodipine-HCTZ (TRIBENZOR) 40-5-25 MG TABS, Take 1 tablet by mouth daily.  , Disp: , Rfl:  .  triamcinolone cream (KENALOG) 0.1 %, 1 application as needed. , Disp: , Rfl:  .  Vitamin D, Ergocalciferol, (DRISDOL) 1.25 MG (50000 UT) CAPS capsule, , Disp: , Rfl:   Review of Systems Constitutional: negative Gastrointestinal: negative Genitourinary: normal  Objective:  There  were no vitals taken for this visit.   BMI: There is no height or weight on file to calculate BMI.  General Appearance: Alert, appropriate appearance for age. No acute distress HEENT: Grossly normal Neck / Thyroid:  Cardiovascular: RRR; normal S1, S2, no murmur Lungs: CTA bilaterally Back: No CVAT Breast Exam: No masses or nodes.No dimpling, nipple retraction or discharge. Gastrointestinal: Soft, non-tender, no masses or organomegaly Pelvic Exam:   VAGINA: normal appearing vagina CERVIX: surgically absent,  UTERUS: surgically absent,  RECTAL: guaiac negative stool obtained, Lymphatic Exam: Non-palpable nodes in neck, clavicular,  axillary, or inguinal regions Skin: no rash or abnormalities Neurologic: Normal gait and speech, no tremor  Psychiatric: Alert and oriented, appropriate affect.  Urinalysis:Not done  By signing my name below, I, Samul Dada, attest that this documentation has been prepared under the direction and in the presence of Jonnie Kind, MD. Electronically Signed: [place]. 06/09/19. 10:33 AM.  I personally performed the services described in this documentation, which was SCRIBED in my presence. The recorded information has been reviewed and considered accurate. It has been edited as necessary during review. Jonnie Kind, MD

## 2019-06-13 ENCOUNTER — Other Ambulatory Visit: Payer: Self-pay | Admitting: "Endocrinology

## 2019-06-17 ENCOUNTER — Ambulatory Visit (HOSPITAL_COMMUNITY)
Admission: RE | Admit: 2019-06-17 | Discharge: 2019-06-17 | Disposition: A | Discharge status: Home / Self Care | Payer: BC Managed Care – PPO | Source: Ambulatory Visit | Attending: Family Medicine | Admitting: Family Medicine

## 2019-06-17 ENCOUNTER — Other Ambulatory Visit: Payer: Self-pay

## 2019-06-17 DIAGNOSIS — Z1231 Encounter for screening mammogram for malignant neoplasm of breast: Secondary | ICD-10-CM | POA: Insufficient documentation

## 2019-07-13 DIAGNOSIS — R710 Precipitous drop in hematocrit: Secondary | ICD-10-CM | POA: Diagnosis not present

## 2019-07-13 DIAGNOSIS — E7849 Other hyperlipidemia: Secondary | ICD-10-CM | POA: Diagnosis not present

## 2019-07-13 DIAGNOSIS — E039 Hypothyroidism, unspecified: Secondary | ICD-10-CM | POA: Diagnosis not present

## 2019-07-13 DIAGNOSIS — I10 Essential (primary) hypertension: Secondary | ICD-10-CM | POA: Diagnosis not present

## 2019-07-13 DIAGNOSIS — E119 Type 2 diabetes mellitus without complications: Secondary | ICD-10-CM | POA: Diagnosis not present

## 2019-07-13 DIAGNOSIS — Z6834 Body mass index (BMI) 34.0-34.9, adult: Secondary | ICD-10-CM | POA: Diagnosis not present

## 2019-07-13 DIAGNOSIS — E6609 Other obesity due to excess calories: Secondary | ICD-10-CM | POA: Diagnosis not present

## 2019-07-20 DIAGNOSIS — Z1211 Encounter for screening for malignant neoplasm of colon: Secondary | ICD-10-CM | POA: Diagnosis not present

## 2019-08-12 DIAGNOSIS — R710 Precipitous drop in hematocrit: Secondary | ICD-10-CM | POA: Diagnosis not present

## 2019-08-19 ENCOUNTER — Other Ambulatory Visit (HOSPITAL_COMMUNITY): Payer: BC Managed Care – PPO

## 2019-08-20 ENCOUNTER — Other Ambulatory Visit (HOSPITAL_COMMUNITY): Payer: BC Managed Care – PPO

## 2019-08-21 ENCOUNTER — Other Ambulatory Visit (HOSPITAL_COMMUNITY): Payer: BC Managed Care – PPO

## 2019-08-24 ENCOUNTER — Other Ambulatory Visit (HOSPITAL_COMMUNITY): Payer: BC Managed Care – PPO

## 2019-09-07 ENCOUNTER — Other Ambulatory Visit: Payer: Self-pay | Admitting: "Endocrinology

## 2019-09-16 ENCOUNTER — Other Ambulatory Visit: Payer: Self-pay

## 2019-09-16 ENCOUNTER — Encounter (HOSPITAL_COMMUNITY): Payer: Self-pay

## 2019-09-16 ENCOUNTER — Encounter (HOSPITAL_COMMUNITY)
Admission: RE | Admit: 2019-09-16 | Discharge: 2019-09-16 | Disposition: A | Discharge status: Home / Self Care | Payer: BC Managed Care – PPO | Source: Ambulatory Visit | Attending: "Endocrinology | Admitting: "Endocrinology

## 2019-09-16 DIAGNOSIS — C73 Malignant neoplasm of thyroid gland: Secondary | ICD-10-CM | POA: Insufficient documentation

## 2019-09-16 MED ORDER — THYROTROPIN ALFA 1.1 MG IM SOLR
0.9000 mg | INTRAMUSCULAR | Status: AC
  Administered 2019-09-16: 0.9 mg via INTRAMUSCULAR

## 2019-09-17 ENCOUNTER — Encounter (HOSPITAL_COMMUNITY)
Admission: RE | Admit: 2019-09-17 | Discharge: 2019-09-17 | Disposition: A | Discharge status: Home / Self Care | Payer: BC Managed Care – PPO | Source: Ambulatory Visit | Attending: "Endocrinology | Admitting: "Endocrinology

## 2019-09-17 DIAGNOSIS — C73 Malignant neoplasm of thyroid gland: Secondary | ICD-10-CM | POA: Diagnosis not present

## 2019-09-17 MED ORDER — THYROTROPIN ALFA 1.1 MG IM SOLR
INTRAMUSCULAR | Status: AC
  Administered 2019-09-17: 0.9 mg via INTRAMUSCULAR
  Filled 2019-09-17: qty 0.9

## 2019-09-17 MED ORDER — STERILE WATER FOR INJECTION IJ SOLN
INTRAMUSCULAR | Status: AC
  Administered 2019-09-17: 1 mL
  Filled 2019-09-17: qty 10

## 2019-09-17 MED ORDER — THYROTROPIN ALFA 1.1 MG IM SOLR: 0.9000 mg | INTRAMUSCULAR | Status: AC

## 2019-09-17 MED ORDER — THYROTROPIN ALFA 1.1 MG IM SOLR
INTRAMUSCULAR | Status: AC
  Filled 2019-09-17: qty 0.9

## 2019-09-18 ENCOUNTER — Encounter (HOSPITAL_COMMUNITY): Payer: Self-pay

## 2019-09-18 ENCOUNTER — Other Ambulatory Visit (HOSPITAL_COMMUNITY)
Admission: RE | Admit: 2019-09-18 | Discharge: 2019-09-18 | Disposition: A | Discharge status: Home / Self Care | Payer: BC Managed Care – PPO | Source: Ambulatory Visit | Attending: "Endocrinology | Admitting: "Endocrinology

## 2019-09-18 ENCOUNTER — Other Ambulatory Visit: Payer: Self-pay

## 2019-09-18 ENCOUNTER — Encounter (HOSPITAL_COMMUNITY)
Admission: RE | Admit: 2019-09-18 | Discharge: 2019-09-18 | Disposition: A | Discharge status: Home / Self Care | Payer: BC Managed Care – PPO | Source: Ambulatory Visit | Attending: "Endocrinology | Admitting: "Endocrinology

## 2019-09-18 DIAGNOSIS — E890 Postprocedural hypothyroidism: Secondary | ICD-10-CM | POA: Insufficient documentation

## 2019-09-18 DIAGNOSIS — E559 Vitamin D deficiency, unspecified: Secondary | ICD-10-CM | POA: Diagnosis not present

## 2019-09-18 DIAGNOSIS — C73 Malignant neoplasm of thyroid gland: Secondary | ICD-10-CM | POA: Insufficient documentation

## 2019-09-18 DIAGNOSIS — R7303 Prediabetes: Secondary | ICD-10-CM | POA: Insufficient documentation

## 2019-09-18 LAB — HEMOGLOBIN A1C
Hgb A1c MFr Bld: 5.8 % — ABNORMAL HIGH (ref 4.8–5.6)
Mean Plasma Glucose: 119.76 mg/dL

## 2019-09-18 LAB — VITAMIN D 25 HYDROXY (VIT D DEFICIENCY, FRACTURES): Vit D, 25-Hydroxy: 42.56 ng/mL (ref 30–100)

## 2019-09-18 LAB — TSH: TSH: 158.147 u[IU]/mL — ABNORMAL HIGH (ref 0.350–4.500)

## 2019-09-18 LAB — T4, FREE: Free T4: 1.47 ng/dL — ABNORMAL HIGH (ref 0.61–1.12)

## 2019-09-18 MED ORDER — SODIUM IODIDE I 131 CAPSULE
4.0000 | Freq: Once | INTRAVENOUS | Status: AC | PRN
  Administered 2019-09-18: 4.25 via ORAL

## 2019-09-21 ENCOUNTER — Other Ambulatory Visit: Payer: Self-pay

## 2019-09-21 ENCOUNTER — Ambulatory Visit: Payer: BC Managed Care – PPO | Admitting: "Endocrinology

## 2019-09-21 ENCOUNTER — Encounter (HOSPITAL_COMMUNITY)
Admission: RE | Admit: 2019-09-21 | Discharge: 2019-09-21 | Disposition: A | Discharge status: Home / Self Care | Payer: BC Managed Care – PPO | Source: Ambulatory Visit | Attending: "Endocrinology | Admitting: "Endocrinology

## 2019-09-21 DIAGNOSIS — C73 Malignant neoplasm of thyroid gland: Secondary | ICD-10-CM | POA: Diagnosis not present

## 2019-09-21 LAB — THYROGLOBULIN ANTIBODY: Thyroglobulin Antibody: <1 IU/mL (ref 0.0–0.9)

## 2019-09-24 ENCOUNTER — Encounter: Payer: Self-pay | Admitting: "Endocrinology

## 2019-09-24 ENCOUNTER — Ambulatory Visit (INDEPENDENT_AMBULATORY_CARE_PROVIDER_SITE_OTHER): Payer: BC Managed Care – PPO | Admitting: "Endocrinology

## 2019-09-24 DIAGNOSIS — I10 Essential (primary) hypertension: Secondary | ICD-10-CM | POA: Diagnosis not present

## 2019-09-24 DIAGNOSIS — C73 Malignant neoplasm of thyroid gland: Secondary | ICD-10-CM

## 2019-09-24 DIAGNOSIS — R7303 Prediabetes: Secondary | ICD-10-CM

## 2019-09-24 DIAGNOSIS — E890 Postprocedural hypothyroidism: Secondary | ICD-10-CM | POA: Diagnosis not present

## 2019-09-24 DIAGNOSIS — E559 Vitamin D deficiency, unspecified: Secondary | ICD-10-CM

## 2019-09-24 LAB — THYROGLOBULIN LEVEL: Thyroglobulin: <2 ng/mL

## 2019-09-24 MED ORDER — METFORMIN HCL 500 MG PO TABS: 500.0000 mg | ORAL_TABLET | Freq: Every day | ORAL | 2 refills | Status: DC

## 2019-09-24 MED ORDER — LEVOTHYROXINE SODIUM 112 MCG PO TABS: ORAL_TABLET | ORAL | 1 refills | Status: DC

## 2019-09-24 MED ORDER — OLMESARTAN-AMLODIPINE-HCTZ 40-5-25 MG PO TABS: 1.0000 | ORAL_TABLET | Freq: Every day | ORAL | 1 refills | Status: DC

## 2019-09-24 NOTE — Progress Notes (Signed)
09/24/2019                                                        Endocrinology Telehealth Visit Follow up Note -During COVID -19 Pandemic  This visit type was conducted due to national recommendations for restrictions regarding the COVID-19 Pandemic  in an effort to limit this patient's exposure and mitigate transmission of the corona virus.  Due to her co-morbid illnesses, Sophia Wilson is at  moderate to high risk for complications without adequate follow up.  This format is felt to be most appropriate for her at this time.  I connected with this patient on 09/24/2019   by telephone and verified that I am speaking with the correct person using two identifiers. Sophia Wilson, Aug 20, 1955. she has verbally consented to this visit. All issues noted in this document were discussed and addressed. The format was not optimal for physical exam.     Subjective:    Patient ID: Sophia Wilson, female    DOB: 05/08/1956, PCP Sharilyn Sites, MD   Past Medical History:  Diagnosis Date  . Anemia   . Cancer [facility])    thyroid cancer  . Collagen vascular disease ([place])   . Diabetes mellitus without complication ([place])   . Eczema   . Fibroid tumor   . GERD (gastroesophageal reflux disease)   . H/O seasonal allergies   . Herpes simplex without mention of complication    rt thigh  . Hypertension   . Thyroid disease    cancer  . Vitiligo    Past Surgical History:  Procedure Laterality Date  . ABDOMINAL HYSTERECTOMY    . COLONOSCOPY  03/10/2012   Procedure: COLONOSCOPY;  Surgeon: Danie Binder, MD;  Location: [facility];  Service: Endoscopy;  Laterality: N/A;  10:30 AM  . THYROIDECTOMY  08/20/2012   Procedure: THYROIDECTOMY;  Surgeon: Jamesetta So, MD;  Location: [facility] ORS;  Service: General;  Laterality: N/A;  Total Thyroidectomy   Social History   Socioeconomic History  . Marital status: Married    Spouse name: Not on file  . Number of children: 1  . Years of education: Not on file  .  Highest education level: Not on file  Occupational History  . Not on file  Tobacco Use  . Smoking status: Never Smoker  . Smokeless tobacco: Never Used  Substance and Sexual Activity  . Alcohol use: No  . Drug use: No  . Sexual activity: Yes    Birth control/protection: Surgical    Comment: hysterectomy  Other Topics Concern  . Not on file  Social History Narrative  . Not on file   Social Determinants of Health   Financial Resource Strain:   . Difficulty of Paying Living Expenses:   Food Insecurity:   . Worried About Charity fundraiser in the Last Year:   . Arboriculturist in the Last Year:   Transportation Needs:   . Film/video editor (Medical):   Marland Kitchen Lack of Transportation (Non-Medical):   Physical Activity:   . Days of Exercise per Week:   . Minutes of Exercise per Session:   Stress:   . Feeling of Stress :   Social Connections:   . Frequency of Communication with Friends and Family:   . Frequency of  Social Gatherings with Friends and Family:   . Attends Religious Services:   . Active Member of Clubs or Organizations:   . Attends Archivist Meetings:   Marland Kitchen Marital Status:    Outpatient Encounter Medications as of 09/24/2019  Medication Sig  . aspirin EC 81 MG tablet Take 81 mg by mouth daily.  . [facility] D3 50 MCG (2000 UT) CAPS TAKE 1 CAPSULE (2,000 UNITS TOTAL) BY MOUTH DAILY WITH BREAKFAST.  . fluticasone (FLONASE) 50 MCG/ACT nasal spray Place 2 sprays into the nose daily as needed. For allergies  . gabapentin (NEURONTIN) 100 MG capsule   . levothyroxine (SYNTHROID) 112 MCG tablet TAKE (1) TABLET BY MOUTH EACH MORNING, WAIT 1/2 HOUR BEFORE BREAKFAST OR TAKING OTHER MEDICATIONS.  Marland Kitchen lidocaine (LIDODERM) 5 % PLACE 1 PATCH ONTO THE SKIN ONCE DAILY. REMOVE USED PATCH AFTER 12 HOURS. (Patient not taking: Reported on 06/09/2019)  . loratadine (CLARITIN) 10 MG tablet Take 10 mg by mouth daily as needed. For allergies  . meloxicam (MOBIC) 15 MG tablet Take 15 mg by  mouth daily.  . metFORMIN (GLUCOPHAGE) 500 MG tablet Take 1 tablet (500 mg total) by mouth daily.  . Olmesartan-amLODIPine-HCTZ (TRIBENZOR) 40-5-25 MG TABS Take 1 tablet by mouth daily.  Marland Kitchen triamcinolone cream (KENALOG) 0.1 % 1 application as needed.   . [DISCONTINUED] levothyroxine (SYNTHROID) 112 MCG tablet TAKE (1) TABLET BY MOUTH EACH MORNING, WAIT 1/2 HOUR BEFORE BREAKFAST OR TAKING OTHER MEDICATIONS.  . [DISCONTINUED] metFORMIN (GLUCOPHAGE) 500 MG tablet TAKE 1 TABLET BY MOUTH EVERY DAY  . [DISCONTINUED] Olmesartan-Amlodipine-HCTZ (TRIBENZOR) 40-5-25 MG TABS Take 1 tablet by mouth daily.    . [DISCONTINUED] Vitamin D, Ergocalciferol, (DRISDOL) 1.25 MG (50000 UT) CAPS capsule    No facility-administered encounter medications on file as of 09/24/2019.   ALLERGIES: Allergies  Allergen Reactions  . Sulfonamide Derivatives Hives   VACCINATION STATUS: Immunization History  Administered Date(s) Administered  . Influenza,inj,Quad PF,6+ Mos 04/18/2018    HPI - 64 year old female patient with medical history as above.     -She is engaged in telehealth via telephone for  follow-up for her history of papillary thyroid cancer, postsurgical hypothyroidism, hypocalcemia, prediabetes. - she underwent thyroidectomy for papillary thyroid cancer in February 2014,  and rTSH stimulated RAI thyroid remnant ablation on 09/05/2012.  her pathology revealed multifocal 1.2, and 0.3 cms follicular variant PTC.  Her surveillance neck ultrasound is negative for any residual thyroid tissue x2, last neck sonogram in 2017.  Second surveillance study with rTSH WBS showed a "focus of abnormal tracer accumulation in mid chest at the midline highly suspicious for metastatic disease potentially to a mediastinal lymph node". She was sent for CT of thorax/neck with contrast which she underwent on 03/18/2015. This was reported as unremarkable, might have been artifactual. -Subsequent surveillance study with rTSH  stimulation showed absence of the previously noticed abnormal tracer accumulation in the mid chest.  - Her most recent whole-body scan with Thyrogen reveals no evidence of distant iodine avid metastasis in November 2018. -Her surveillance thyroid/neck ultrasound on June 24, 2018 was negative for any residual thyroid tissue or recurrence.    -Her most recent Thyrogen stimulated whole-body scan was negative for evidence of tumor recurrence.  She is on levothyroxine 112 mcg po qday. She is also on calcium supplements.  She is compliant with her medications taking it daily on empty stomach with water. She denies dysphagia, SOB, nor voice change.  -She continues to feel better.  She has a steady  weight since last visit.     -She denies any family hx of thyroid dysfunction nor cancer.  -She is on metformin 500 mg p.o. daily at breakfast for prediabetes.  Review of Systems Limited as above.  Objective:    There were no vitals taken for this visit.  Wt Readings from Last 3 Encounters:  06/09/19 227 lb (103 kg)  09/19/18 231 lb 12.8 oz (105.1 kg)  06/18/18 232 lb (105.2 kg)    complete Blood Count (Most recent): Lab Results  Component Value Date   WBC 6.3 08/21/2012   HGB 10.0 (L) 08/21/2012   HCT 29.9 (L) 08/21/2012   MCV 81.7 08/21/2012   PLT 241 08/21/2012    Recent Results (from the past 2160 hour(s))  TSH     Status: Abnormal   Collection Time: 09/18/19  8:30 AM  Result Value Ref Range   TSH 158.147 (H) 0.350 - 4.500 uIU/mL    Comment: Performed by a 3rd Generation assay with a functional sensitivity of <=0.01 uIU/mL. Performed at [facility], [address] [place], [place] [address]   T4, free     Status: Abnormal   Collection Time: 09/18/19  8:30 AM  Result Value Ref Range   Free T4 1.47 (H) 0.61 - 1.12 ng/dL    Comment: (NOTE) Biotin ingestion may interfere with free T4 tests. If the results are inconsistent with the TSH level, previous test results, or  the clinical presentation, then consider biotin interference. If needed, order repeat testing after stopping biotin. Performed at [facility], [place] [address], [place], [place] [address]   Hemoglobin A1c     Status: Abnormal   Collection Time: 09/18/19  8:31 AM  Result Value Ref Range   Hgb A1c MFr Bld 5.8 (H) 4.8 - 5.6 %    Comment: (NOTE) Pre diabetes:          5.7%-6.4% Diabetes:              >6.4% Glycemic control for   <7.0% adults with diabetes    Mean Plasma Glucose 119.76 mg/dL    Comment: Performed at [facility] [address] [place], [place] [address]  VITAMIN D 25 Hydroxy (Vit-D Deficiency, Fractures)     Status: None   Collection Time: 09/18/19  8:31 AM  Result Value Ref Range   Vit D, 25-Hydroxy 42.56 30 - 100 ng/mL    Comment: (NOTE) Vitamin D deficiency has been defined by the [place] practice guideline as a level of serum 25-OH  vitamin D less than 20 ng/mL (1,2). The Endocrine Society went on to  further define vitamin D insufficiency as a level between 21 and 29  ng/mL (2). 1. IOM (Institute of Medicine). 2010. Dietary reference intakes for  calcium and D. [place]: The [employer]. 2. Holick MF, Binkley [place], Bischoff-Ferrari HA, et al. Evaluation,  treatment, and prevention of vitamin D deficiency: an Endocrine  Society clinical practice guideline, JCEM. 2011 Jul; 96(7): 1911-30. Performed at [facility], [place] [address], [place], [place] [address]   Thyroglobulin antibody     Status: None   Collection Time: 09/18/19  8:31 AM  Result Value Ref Range   Thyroglobulin Antibody <1.0 0.0 - 0.9 IU/mL    Comment: (NOTE) Thyroglobulin Antibody measured by [facility] Methodology Performed At: [facility] [place], [place] [id number] Rush Farmer MD OA:[id number]     Calcium 10.5  above target on August 27, 2018   Thyroid/neck ultrasound on  June 24, 2018 No discrete nodules are seen within the thyroidectomy bed.  IMPRESSION: No residual or recurrent tissue post total thyroidectomy.   Thyrogen stimulated whole-body scan on September 21, 2019 FINDINGS: No focal uptake in the thyroid bed. Mild uptake in the mediastinum is less than exam from 06/14/2017. Physiologic uptake noted in the salivary glands and GI tract as well as a GU tract.  IMPRESSION: No scintigraphic thyroid cancer recurrence.  Assessment & Plan:   1. Malignant neoplasm of thyroid gland (HCC)  - She has had multifocal FVPTC ( 1.2 and 0.3 cms), stage 1 , with no evidence of lymphovascular invasion. She is s/p total thyroidectomy February 2014. She is s/p rTSH stimulated RAI remnant ablation with post therapy scan which is negative for distant spread on 09/05/2012. Her stimulated TG level was 1.9 along with <20 Tg Abs.  her surveillance thyroid u/s is negative for any residual thyroid tissue x 1. Her last neck/ thyroid ultrasound is s/f surgically absent thyroid .  Second surveillance study with rTSH WBS on 02/18/2015 showed a "focus of abnormal tracer accumulation in mid chest at the midline highly suspicious for metastatic disease potentially to a mediastinal lymph node". - CT with contrast of her chest and neck were unremarkable, might have been artifactual.     Her  rTSH stimulated whole-body scan on 12/09/2015 is reported as unremarkable,  Specifically, focus of abnormal uptake seen within the mid chest on the previous exam is no longer identified.  -  thyroglobulin level is 0.1 improving from 1.9 ,  with antithyroglobulin antibodies at <0.1.  -  Her surveillance thyroid/neck ultrasound on 04/03/2016 was unremarkable,  showing surgically absent thyroid. - Her most recent Thyrogen stimulated whole-body scan from 06/14/2017 and thyroglobulin level before this visit where reassuring with no evidence of distant iodine avid metastasis. -Her previsit  unstimulated thyroglobulin level is 0.1 along with thyroglobulin antibodies.  June 24, 2018 surveillance thyroid/neck ultrasound: No residual or recurrent tissue post total thyroidectomy.  -Her most recent Thyrogen stimulated whole-body scan was negative for evidence of tumor recurrence.  2. Postsurgical hypothyroidism - Post surgical hypothyroidism on suppressive therapy: Her thyroid function tests are consistent with appropriate suppressive therapy.  Her recent high TSH is a result of thyroid injection, hence disregard.  -She is advised to continue levothyroxine 112 mcg daily before breakfast.     - We discussed about the correct intake of her thyroid hormone, on empty stomach at fasting, with water, separated by at least 30 minutes from breakfast and other medications,  and separated by more than 4 hours from calcium, iron, multivitamins, acid reflux medications (PPIs). -Patient is made aware of the fact that thyroid hormone replacement is needed for life, dose to be adjusted by periodic monitoring of thyroid function tests.   3.  hypertension -she is advised to home monitor blood pressure and report if > 140/90 on 2 separate readings. -She is advised to be consistent on her blood pressure medications including Tribenzor 40-5-20 5 mg p.o. daily.    4. Pre-diabetes -Her most recent A1c has improved to 5.8% from 6.3%.  she is tolerating low-dose metformin 500 mg p.o. daily, advised to continue.   - she  admits there is a room for improvement in her diet and drink choices. -  Suggestion is made for her to avoid simple carbohydrates  from her diet including Cakes, Sweet Desserts / Pastries, Ice Cream, Soda (diet and  regular), Sweet Tea, Candies, Chips, Cookies, Sweet Pastries,  Store Bought Juices, Alcohol in Excess of  1-2 drinks a day, Artificial Sweeteners, Coffee Creamer, and "Sugar-free" Products. This will help patient to have stable blood glucose profile and potentially avoid  unintended weight gain.   5) hypercalcemia : Her calcium is now above target at 10.5.  She is advised to discontinue calcium supplements.  However she will need vitamin D supplement.   -I discussed and prescribed vitamin D3 2000 units daily supplement.  - I advised patient to maintain close follow up with Sharilyn Sites, MD for primary care needs.  - Time spent on this patient care encounter:  35 min, of which >50% was spent in  counseling and the rest reviewing her  current and  previous labs/studies ( including abstraction from other facilities),  previous treatments,  and medications' doses and developing a plan for long-term care based on the latest recommendations for standards of care; and documenting her care.  Sophia Wilson participated in the discussions, expressed understanding, and voiced agreement with the above plans.  All questions were answered to her satisfaction. she is encouraged to contact clinic should she have any questions or concerns prior to her return visit.   Follow up plan: Return in about 6 months (around 03/26/2020) for Follow up with Pre-visit Labs, Next Visit A1c in Office.  Glade Lloyd, MD Phone: [phone]  Fax: [phone]  -  This note was partially dictated with voice recognition software. Similar sounding words can be transcribed inadequately or may not  be corrected upon review.  09/24/2019, 10:51 AM

## 2019-09-24 NOTE — Patient Instructions (Signed)

## 2019-10-01 ENCOUNTER — Telehealth: Payer: Self-pay | Admitting: "Endocrinology

## 2019-10-01 MED ORDER — METFORMIN HCL 500 MG PO TABS: 500.0000 mg | ORAL_TABLET | Freq: Every day | ORAL | 0 refills | Status: DC

## 2019-10-01 MED ORDER — LEVOTHYROXINE SODIUM 112 MCG PO TABS: ORAL_TABLET | ORAL | 0 refills | Status: DC

## 2019-10-01 NOTE — Telephone Encounter (Signed)
[facility]

## 2019-10-01 NOTE — Telephone Encounter (Signed)
Pt said that her thyroid medication and metformin went to wrong pharmacy. Can you re send to [facility] [place]

## 2020-01-11 ENCOUNTER — Other Ambulatory Visit: Payer: Self-pay | Admitting: "Endocrinology

## 2020-03-03 ENCOUNTER — Other Ambulatory Visit: Payer: Self-pay | Admitting: "Endocrinology

## 2020-03-22 DIAGNOSIS — I10 Essential (primary) hypertension: Secondary | ICD-10-CM | POA: Diagnosis not present

## 2020-03-22 DIAGNOSIS — E890 Postprocedural hypothyroidism: Secondary | ICD-10-CM | POA: Diagnosis not present

## 2020-03-22 DIAGNOSIS — C73 Malignant neoplasm of thyroid gland: Secondary | ICD-10-CM | POA: Diagnosis not present

## 2020-03-23 LAB — COMPLETE METABOLIC PANEL WITH GFR
AG Ratio: 1.3 (calc) (ref 1.0–2.5)
ALT: 11 U/L (ref 6–29)
AST: 19 U/L (ref 10–35)
Albumin: 4.1 g/dL (ref 3.6–5.1)
Alkaline phosphatase (APISO): 62 U/L (ref 37–153)
BUN: 23 mg/dL (ref 7–25)
CO2: 32 mmol/L (ref 20–32)
Calcium: 9.9 mg/dL (ref 8.6–10.4)
Chloride: 102 mmol/L (ref 98–110)
Creat: 0.96 mg/dL (ref 0.50–0.99)
GFR, Est African American: 73 mL/min/{1.73_m2} (ref >=60)
GFR, Est Non African American: 63 mL/min/{1.73_m2} (ref >=60)
Globulin: 3.2 g/dL (calc) (ref 1.9–3.7)
Glucose, Bld: 92 mg/dL (ref 65–139)
Potassium: 4.2 mmol/L (ref 3.5–5.3)
Sodium: 140 mmol/L (ref 135–146)
Total Bilirubin: 0.5 mg/dL (ref 0.2–1.2)
Total Protein: 7.3 g/dL (ref 6.1–8.1)

## 2020-03-23 LAB — THYROGLOBULIN LEVEL: Thyroglobulin: 0.1 ng/mL — ABNORMAL LOW

## 2020-03-23 LAB — T4, FREE: Free T4: 1.7 ng/dL (ref 0.8–1.8)

## 2020-03-23 LAB — TSH: TSH: 0.52 mIU/L (ref 0.40–4.50)

## 2020-03-23 LAB — THYROGLOBULIN ANTIBODY: Thyroglobulin Ab: <1 IU/mL (ref <=1)

## 2020-03-29 ENCOUNTER — Encounter: Payer: Self-pay | Admitting: "Endocrinology

## 2020-03-29 ENCOUNTER — Telehealth (INDEPENDENT_AMBULATORY_CARE_PROVIDER_SITE_OTHER): Payer: BC Managed Care – PPO | Admitting: "Endocrinology

## 2020-03-29 ENCOUNTER — Ambulatory Visit: Payer: BC Managed Care – PPO | Admitting: "Endocrinology

## 2020-03-29 VITALS — Ht 66.0 in | Wt 227.0 lb

## 2020-03-29 DIAGNOSIS — E559 Vitamin D deficiency, unspecified: Secondary | ICD-10-CM | POA: Diagnosis not present

## 2020-03-29 DIAGNOSIS — R7303 Prediabetes: Secondary | ICD-10-CM

## 2020-03-29 DIAGNOSIS — C73 Malignant neoplasm of thyroid gland: Secondary | ICD-10-CM

## 2020-03-29 DIAGNOSIS — E890 Postprocedural hypothyroidism: Secondary | ICD-10-CM | POA: Diagnosis not present

## 2020-03-29 MED ORDER — METFORMIN HCL 500 MG PO TABS: 500.0000 mg | ORAL_TABLET | Freq: Every day | ORAL | 1 refills | Status: DC

## 2020-03-29 MED ORDER — LEVOTHYROXINE SODIUM 112 MCG PO TABS: ORAL_TABLET | ORAL | 1 refills | Status: DC

## 2020-03-29 NOTE — Progress Notes (Signed)
03/29/2020                                    Endocrinology Telehealth Visit Follow up Note -During COVID -19 Pandemic  This visit type was conducted  via telephone due to national recommendations for restrictions regarding the COVID-19 Pandemic  in an effort to limit this patient's exposure and mitigate transmission of the corona virus.   I connected with Sophia Wilson on 03/29/2020   by telephone and verified that I am speaking with the correct person using two identifiers. Sophia Wilson, [birth year]. she has verbally consented to this visit.  I was in my office and patient was in her residence. No other persons were with me during the encounter. All issues noted in this document were discussed and addressed. The format was not optimal for physical exam.    Subjective:    Patient ID: Sophia Wilson, female    DOB: 1956/05/17, PCP Sharilyn Sites, MD   Past Medical History:  Diagnosis Date  . Anemia   . Cancer [facility])    thyroid cancer  . Collagen vascular disease ([place])   . Diabetes mellitus without complication ([place])   . Eczema   . Fibroid tumor   . GERD (gastroesophageal reflux disease)   . H/O seasonal allergies   . Herpes simplex without mention of complication    rt thigh  . Hypertension   . Thyroid disease    cancer  . Vitiligo    Past Surgical History:  Procedure Laterality Date  . ABDOMINAL HYSTERECTOMY    . COLONOSCOPY  03/10/2012   Procedure: COLONOSCOPY;  Surgeon: Danie Binder, MD;  Location: [facility];  Service: Endoscopy;  Laterality: N/A;  10:30 AM  . THYROIDECTOMY  08/20/2012   Procedure: THYROIDECTOMY;  Surgeon: Jamesetta So, MD;  Location: [facility] ORS;  Service: General;  Laterality: N/A;  Total Thyroidectomy   Social History   Socioeconomic History  . Marital status: Married    Spouse name: Not on file  . Number of children: 1  . Years of education: Not on file  . Highest education level: Not on file  Occupational History  . Not on file  Tobacco  Use  . Smoking status: Never Smoker  . Smokeless tobacco: Never Used  Vaping Use  . Vaping Use: Never used  Substance and Sexual Activity  . Alcohol use: No  . Drug use: No  . Sexual activity: Yes    Birth control/protection: Surgical    Comment: hysterectomy  Other Topics Concern  . Not on file  Social History Narrative  . Not on file   Social Determinants of Health   Financial Resource Strain:   . Difficulty of Paying Living Expenses: Not on file  Food Insecurity:   . Worried About Charity fundraiser in the Last Year: Not on file  . Ran Out of Food in the Last Year: Not on file  Transportation Needs:   . Lack of Transportation (Medical): Not on file  . Lack of Transportation (Non-Medical): Not on file  Physical Activity:   . Days of Exercise per Week: Not on file  . Minutes of Exercise per Session: Not on file  Stress:   . Feeling of Stress : Not on file  Social Connections:   . Frequency of Communication with Friends and Family: Not on file  . Frequency of Social Gatherings with Friends  and Family: Not on file  . Attends Religious Services: Not on file  . Active Member of Clubs or Organizations: Not on file  . Attends Archivist Meetings: Not on file  . Marital Status: Not on file   Outpatient Encounter Medications as of 03/29/2020  Medication Sig  . aspirin EC 81 MG tablet Take 81 mg by mouth daily.  . [employer] D3 50 MCG (2000 UT) CAPS TAKE 1 CAPSULE (2,000 UNITS TOTAL) BY MOUTH DAILY WITH BREAKFAST.  . fluticasone (FLONASE) 50 MCG/ACT nasal spray Place 2 sprays into the nose daily as needed. For allergies  . levothyroxine (SYNTHROID) 112 MCG tablet TAKE (1) TABLET BY MOUTH EACH MORNING, WAIT 1/2 HOUR BEFORE BREAKFAST OR TAKING OTHER MEDICATIONS.  Marland Kitchen loratadine (CLARITIN) 10 MG tablet Take 10 mg by mouth daily as needed. For allergies  . meloxicam (MOBIC) 15 MG tablet Take 15 mg by mouth daily.  . metFORMIN (GLUCOPHAGE) 500 MG tablet Take 1 tablet (500 mg total)  by mouth daily.  . Olmesartan-amLODIPine-HCTZ 40-5-25 MG TABS TAKE (1) TABLET BY MOUTH DAILY - NEED FOLLOW UP FOR MORE REFILLS.  Marland Kitchen triamcinolone cream (KENALOG) 0.1 % 1 application as needed.   . [DISCONTINUED] gabapentin (NEURONTIN) 100 MG capsule   . [DISCONTINUED] levothyroxine (SYNTHROID) 112 MCG tablet TAKE (1) TABLET BY MOUTH EACH MORNING, WAIT 1/2 HOUR BEFORE BREAKFAST OR TAKING OTHER MEDICATIONS.  . [DISCONTINUED] lidocaine (LIDODERM) 5 % PLACE 1 PATCH ONTO THE SKIN ONCE DAILY. REMOVE USED PATCH AFTER 12 HOURS. (Patient not taking: Reported on 06/09/2019)  . [DISCONTINUED] metFORMIN (GLUCOPHAGE) 500 MG tablet TAKE 1 TABLET BY MOUTH EVERY DAY   No facility-administered encounter medications on file as of 03/29/2020.   ALLERGIES: Allergies  Allergen Reactions  . Sulfonamide Derivatives Hives   VACCINATION STATUS: Immunization History  Administered Date(s) Administered  . Influenza,inj,Quad PF,6+ Mos 04/18/2018    HPI - 64 year old female patient with medical history as above.     -She is engaged in telehealth via telephone for  follow-up for her history of papillary thyroid cancer, postsurgical hypothyroidism, hypocalcemia, prediabetes. - she underwent thyroidectomy for papillary thyroid cancer in February 2014,  and rTSH stimulated RAI thyroid remnant ablation on 09/05/2012.  her pathology revealed multifocal 1.2, and 0.3 cms follicular variant PTC.  Her surveillance neck ultrasound is negative for any residual thyroid tissue x2, last neck sonogram in 2017.  Second surveillance study with rTSH WBS showed a "focus of abnormal tracer accumulation in mid chest at the midline highly suspicious for metastatic disease potentially to a mediastinal lymph node". She was sent for CT of thorax/neck with contrast which she underwent on 03/18/2015. This was reported as unremarkable, might have been artifactual. -Subsequent surveillance study with rTSH stimulation showed absence of the  previously noticed abnormal tracer accumulation in the mid chest.  - Her most recent whole-body scan with Thyrogen reveals no evidence of distant iodine avid metastasis in November 2018. -Her surveillance thyroid/neck ultrasound on June 24, 2018 was negative for any residual thyroid tissue or recurrence.    -Her most recent Thyrogen stimulated whole-body scan was negative for evidence of tumor recurrence.  She is currently on levothyroxine 112 mcg p.o. daily before breakfast.  She reports compliance with medication.    She denies dysphagia, SOB, nor voice change.  She has no new complaints today. -She continues to feel better.      -She denies any family hx of thyroid dysfunction nor cancer.  -She is on metformin 500 mg p.o. daily  at breakfast for prediabetes.  Review of Systems Limited as above.  Objective:    Ht '5\' 6"'  (1.676 m)   Wt 227 lb (103 kg)   BMI 36.64 kg/m   Wt Readings from Last 3 Encounters:  03/29/20 227 lb (103 kg)  06/09/19 227 lb (103 kg)  09/19/18 231 lb 12.8 oz (105.1 kg)    complete Blood Count (Most recent): Lab Results  Component Value Date   WBC 6.3 08/21/2012   HGB 10.0 (L) 08/21/2012   HCT 29.9 (L) 08/21/2012   MCV 81.7 08/21/2012   PLT 241 08/21/2012    Recent Results (from the past 2160 hour(s))  TSH     Status: None   Collection Time: 03/22/20  9:55 AM  Result Value Ref Range   TSH 0.52 0.40 - 4.50 mIU/L  T4, free     Status: None   Collection Time: 03/22/20  9:55 AM  Result Value Ref Range   Free T4 1.7 0.8 - 1.8 ng/dL  Thyroglobulin antibody     Status: None   Collection Time: 03/22/20  9:55 AM  Result Value Ref Range   Thyroglobulin Ab <1 < or = 1 IU/mL  Thyroglobulin Level     Status: Abnormal   Collection Time: 03/22/20  9:55 AM  Result Value Ref Range   Thyroglobulin 0.1 (L) ng/mL    Comment:       Reference Range:       Intact Thyroid   2.8-40.9       Athyrotic        <0.1 .       Note: Abnormal flagging is based        on the reference interval for        patients with intact thyroid. . . This test was performed using the Beckman Coulter  chemiluminescent method. Values obtained from different assay methods cannot be used interchangeably. Thyroglobulin levels, regardless of value, should not be interpreted as absolute evidence of the presence or absence of disease. .    Comment      Comment: . Thyroglobulin antibodies (TGAB) interfere with thyroglobulin (TG) assays; therefore, TGAB assay should always be performed in conjunction with a TG assay. . . For additional information, please refer to  http://education.questdiagnostics.com/faq/FAQ202  (This link is being provided for informational/ educational purposes only.) .   COMPLETE METABOLIC PANEL WITH GFR     Status: None   Collection Time: 03/22/20  9:55 AM  Result Value Ref Range   Glucose, Bld 92 65 - 139 mg/dL    Comment: .        Non-fasting reference interval .    BUN 23 7 - 25 mg/dL   Creat 0.96 0.50 - 0.99 mg/dL    Comment: For patients >75 years of age, the reference limit for Creatinine is approximately 13% higher for people identified as African-American. .    GFR, Est Non African American 63 > OR = 60 mL/min/1.54m   GFR, Est African American 73 > OR = 60 mL/min/1.759m  BUN/Creatinine Ratio NOT APPLICABLE 6 - 22 (calc)   Sodium 140 135 - 146 mmol/L   Potassium 4.2 3.5 - 5.3 mmol/L   Chloride 102 98 - 110 mmol/L   CO2 32 20 - 32 mmol/L   Calcium 9.9 8.6 - 10.4 mg/dL   Total Protein 7.3 6.1 - 8.1 g/dL   Albumin 4.1 3.6 - 5.1 g/dL   Globulin 3.2 1.9 - 3.7 g/dL (calc)   AG  Ratio 1.3 1.0 - 2.5 (calc)   Total Bilirubin 0.5 0.2 - 1.2 mg/dL   Alkaline phosphatase (APISO) 62 37 - 153 U/L   AST 19 10 - 35 U/L   ALT 11 6 - 29 U/L    Calcium 10.5 above target on August 27, 2018   Thyroid/neck ultrasound on June 24, 2018 No discrete nodules are seen within the thyroidectomy bed.  IMPRESSION: No residual or  recurrent tissue post total thyroidectomy.   Thyrogen stimulated whole-body scan on September 21, 2019 FINDINGS: No focal uptake in the thyroid bed. Mild uptake in the mediastinum is less than exam from 06/14/2017. Physiologic uptake noted in the salivary glands and GI tract as well as a GU tract.  IMPRESSION: No scintigraphic thyroid cancer recurrence.  Assessment & Plan:   1. Malignant neoplasm of thyroid gland (H[facility])  - She has had multifocal FVPTC ( 1.2 and 0.3 cms), stage 1 , with no evidence of lymphovascular invasion. She is s/p total thyroidectomy February 2014. She is s/p rTSH stimulated RAI remnant ablation with post therapy scan which is negative for distant spread on 09/05/2012. Her stimulated TG level was 1.9 along with <20 Tg Abs.  her surveillance thyroid u/s is negative for any residual thyroid tissue x 1. Her last neck/ thyroid ultrasound is s/f surgically absent thyroid .  Second surveillance study with rTSH WBS on 02/18/2015 showed a "focus of abnormal tracer accumulation in mid chest at the midline highly suspicious for metastatic disease potentially to a mediastinal lymph node". - CT with contrast of her chest and neck were unremarkable, might have been artifactual.     Her  rTSH stimulated whole-body scan on 12/09/2015 is reported as unremarkable,  Specifically, focus of abnormal uptake seen within the mid chest on the previous exam is no longer identified.  -  thyroglobulin level is 0.1 improving from 1.9 ,  with antithyroglobulin antibodies at <0.1.  -  Her surveillance thyroid/neck ultrasound on 04/03/2016 was unremarkable,  showing surgically absent thyroid. - Her most recent Thyrogen stimulated whole-body scan from 06/14/2017 and thyroglobulin level before this visit where reassuring with no evidence of distant iodine avid metastasis. -Her previsit unstimulated thyroglobulin level is 0.1 along with thyroglobulin antibodies.  June 24, 2018 surveillance  thyroid/neck ultrasound: No residual or recurrent tissue post total thyroidectomy.  -Her most recent Thyrogen stimulated whole-body scan was negative for evidence of tumor recurrence.  -She will be considered for surveillance thyroid/neck ultrasound before next visit in 6 months.  2. Postsurgical hypothyroidism - Post surgical hypothyroidism on suppressive therapy: Her thyroid function tests are consistent with appropriate suppressive therapy.  She is advised to continue levothyroxine 112 mcg p.o. daily before breakfast.     - We discussed about the correct intake of her thyroid hormone, on empty stomach at fasting, with water, separated by at least 30 minutes from breakfast and other medications,  and separated by more than 4 hours from calcium, iron, multivitamins, acid reflux medications (PPIs). -Patient is made aware of the fact that thyroid hormone replacement is needed for life, dose to be adjusted by periodic monitoring of thyroid function tests.    3.  hypertension she is advised to home monitor blood pressure and report if > 140/90 on 2 separate readings.  -She is advised to be consistent on her blood pressure medications including Tribenzor 40-5-20 5 mg p.o. daily.    4. Pre-diabetes -Her most recent A1c has improved to 5.8% from 6.3%.  She is tolerating low-dose Metformin.  She is advised to continue Metformin 500 mg p.o. daily after breakfast.    - she  admits there is a room for improvement in her diet and drink choices. -  Suggestion is made for her to avoid simple carbohydrates  from her diet including Cakes, Sweet Desserts / Pastries, Ice Cream, Soda (diet and regular), Sweet Tea, Candies, Chips, Cookies, Sweet Pastries,  Store Bought Juices, Alcohol in Excess of  1-2 drinks a day, Artificial Sweeteners, Coffee Creamer, and "Sugar-free" Products. This will help patient to have stable blood glucose profile and potentially avoid unintended weight gain.   5) hypercalcemia :  It was likely related to her calcium supplements.  This was discontinued during her last visit.  She is advised to stay off of calcium supplements, however continue on vitamin D3 2000 units daily.     - I advised patient to maintain close follow up with Sharilyn Sites, MD for primary care needs.     - Time spent on this patient care encounter:  30 minutes of which 50% was spent in  counseling and the rest reviewing  her current and  previous labs / studies and medications  doses and developing a plan for long term care. Sophia Wilson  participated in the discussions, expressed understanding, and voiced agreement with the above plans.  All questions were answered to her satisfaction. she is encouraged to contact clinic should she have any questions or concerns prior to her return visit.   Follow up plan: Return in about 6 months (around 09/26/2020) for F/U with Pre-visit Labs, Thyroid / Neck Ultrasound, [place] A1c in Office.  Glade Lloyd, MD Phone: [phone]  Fax: [phone]  -  This note was partially dictated with voice recognition software. Similar sounding words can be transcribed inadequately or may not  be corrected upon review.  03/29/2020, 12:42 PM

## 2020-04-13 DIAGNOSIS — E6609 Other obesity due to excess calories: Secondary | ICD-10-CM | POA: Diagnosis not present

## 2020-04-13 DIAGNOSIS — E1140 Type 2 diabetes mellitus with diabetic neuropathy, unspecified: Secondary | ICD-10-CM | POA: Diagnosis not present

## 2020-04-13 DIAGNOSIS — Z23 Encounter for immunization: Secondary | ICD-10-CM | POA: Diagnosis not present

## 2020-04-13 DIAGNOSIS — I10 Essential (primary) hypertension: Secondary | ICD-10-CM | POA: Diagnosis not present

## 2020-04-13 DIAGNOSIS — E7849 Other hyperlipidemia: Secondary | ICD-10-CM | POA: Diagnosis not present

## 2020-04-13 DIAGNOSIS — Z1331 Encounter for screening for depression: Secondary | ICD-10-CM | POA: Diagnosis not present

## 2020-04-13 DIAGNOSIS — R6889 Other general symptoms and signs: Secondary | ICD-10-CM | POA: Diagnosis not present

## 2020-04-13 DIAGNOSIS — E039 Hypothyroidism, unspecified: Secondary | ICD-10-CM | POA: Diagnosis not present

## 2020-04-13 DIAGNOSIS — Z6834 Body mass index (BMI) 34.0-34.9, adult: Secondary | ICD-10-CM | POA: Diagnosis not present

## 2020-04-13 DIAGNOSIS — Z1389 Encounter for screening for other disorder: Secondary | ICD-10-CM | POA: Diagnosis not present

## 2020-05-23 ENCOUNTER — Other Ambulatory Visit (HOSPITAL_COMMUNITY): Payer: Self-pay | Admitting: Family Medicine

## 2020-05-23 DIAGNOSIS — Z1231 Encounter for screening mammogram for malignant neoplasm of breast: Secondary | ICD-10-CM

## 2020-06-24 ENCOUNTER — Other Ambulatory Visit: Payer: Self-pay

## 2020-06-24 ENCOUNTER — Ambulatory Visit (HOSPITAL_COMMUNITY)
Admission: RE | Admit: 2020-06-24 | Discharge: 2020-06-24 | Disposition: A | Discharge status: Home / Self Care | Payer: BC Managed Care – PPO | Source: Ambulatory Visit | Attending: Family Medicine | Admitting: Family Medicine

## 2020-06-24 DIAGNOSIS — Z1231 Encounter for screening mammogram for malignant neoplasm of breast: Secondary | ICD-10-CM | POA: Diagnosis not present

## 2020-07-15 DIAGNOSIS — H5203 Hypermetropia, bilateral: Secondary | ICD-10-CM | POA: Diagnosis not present

## 2020-07-15 DIAGNOSIS — Z7984 Long term (current) use of oral hypoglycemic drugs: Secondary | ICD-10-CM | POA: Diagnosis not present

## 2020-07-15 DIAGNOSIS — E119 Type 2 diabetes mellitus without complications: Secondary | ICD-10-CM | POA: Diagnosis not present

## 2020-07-15 DIAGNOSIS — H2513 Age-related nuclear cataract, bilateral: Secondary | ICD-10-CM | POA: Diagnosis not present

## 2020-07-15 LAB — HM DIABETES EYE EXAM

## 2020-08-09 DIAGNOSIS — E1140 Type 2 diabetes mellitus with diabetic neuropathy, unspecified: Secondary | ICD-10-CM | POA: Diagnosis not present

## 2020-08-09 DIAGNOSIS — D649 Anemia, unspecified: Secondary | ICD-10-CM | POA: Diagnosis not present

## 2020-08-09 DIAGNOSIS — C73 Malignant neoplasm of thyroid gland: Secondary | ICD-10-CM | POA: Diagnosis not present

## 2020-08-09 DIAGNOSIS — E7849 Other hyperlipidemia: Secondary | ICD-10-CM | POA: Diagnosis not present

## 2020-08-09 DIAGNOSIS — E039 Hypothyroidism, unspecified: Secondary | ICD-10-CM | POA: Diagnosis not present

## 2020-08-09 DIAGNOSIS — Z6833 Body mass index (BMI) 33.0-33.9, adult: Secondary | ICD-10-CM | POA: Diagnosis not present

## 2020-08-09 DIAGNOSIS — E538 Deficiency of other specified B group vitamins: Secondary | ICD-10-CM | POA: Diagnosis not present

## 2020-08-09 DIAGNOSIS — I10 Essential (primary) hypertension: Secondary | ICD-10-CM | POA: Diagnosis not present

## 2020-08-19 ENCOUNTER — Other Ambulatory Visit: Payer: Self-pay

## 2020-08-19 ENCOUNTER — Ambulatory Visit (HOSPITAL_COMMUNITY)
Admission: RE | Admit: 2020-08-19 | Discharge: 2020-08-19 | Disposition: A | Discharge status: Home / Self Care | Payer: BC Managed Care – PPO | Source: Ambulatory Visit | Attending: "Endocrinology | Admitting: "Endocrinology

## 2020-08-19 DIAGNOSIS — Z85850 Personal history of malignant neoplasm of thyroid: Secondary | ICD-10-CM | POA: Diagnosis not present

## 2020-08-19 DIAGNOSIS — Z9889 Other specified postprocedural states: Secondary | ICD-10-CM | POA: Diagnosis not present

## 2020-08-19 DIAGNOSIS — C73 Malignant neoplasm of thyroid gland: Secondary | ICD-10-CM | POA: Diagnosis not present

## 2020-08-19 DIAGNOSIS — E890 Postprocedural hypothyroidism: Secondary | ICD-10-CM | POA: Diagnosis not present

## 2020-09-05 ENCOUNTER — Other Ambulatory Visit: Payer: Self-pay | Admitting: "Endocrinology

## 2020-09-20 DIAGNOSIS — E890 Postprocedural hypothyroidism: Secondary | ICD-10-CM | POA: Diagnosis not present

## 2020-09-21 LAB — TSH: TSH: 0.448 u[IU]/mL — ABNORMAL LOW (ref 0.450–4.500)

## 2020-09-21 LAB — T4, FREE: Free T4: 1.99 ng/dL — ABNORMAL HIGH (ref 0.82–1.77)

## 2020-09-27 ENCOUNTER — Encounter: Payer: Self-pay | Admitting: "Endocrinology

## 2020-09-27 ENCOUNTER — Other Ambulatory Visit: Payer: Self-pay

## 2020-09-27 ENCOUNTER — Ambulatory Visit (INDEPENDENT_AMBULATORY_CARE_PROVIDER_SITE_OTHER): Payer: BC Managed Care – PPO | Admitting: "Endocrinology

## 2020-09-27 VITALS — Ht 66.0 in | Wt 221.8 lb

## 2020-09-27 DIAGNOSIS — R7303 Prediabetes: Secondary | ICD-10-CM | POA: Diagnosis not present

## 2020-09-27 DIAGNOSIS — E890 Postprocedural hypothyroidism: Secondary | ICD-10-CM

## 2020-09-27 DIAGNOSIS — E1140 Type 2 diabetes mellitus with diabetic neuropathy, unspecified: Secondary | ICD-10-CM | POA: Diagnosis not present

## 2020-09-27 DIAGNOSIS — E559 Vitamin D deficiency, unspecified: Secondary | ICD-10-CM | POA: Diagnosis not present

## 2020-09-27 DIAGNOSIS — C73 Malignant neoplasm of thyroid gland: Secondary | ICD-10-CM

## 2020-09-27 LAB — POCT GLYCOSYLATED HEMOGLOBIN (HGB A1C): Hemoglobin A1C: 6.2 % — AB (ref 4.0–5.6)

## 2020-09-27 MED ORDER — LEVOTHYROXINE SODIUM 100 MCG PO TABS: ORAL_TABLET | ORAL | 1 refills | Status: DC

## 2020-09-27 NOTE — Patient Instructions (Signed)

## 2020-09-27 NOTE — Progress Notes (Signed)
09/27/2020        Endocrinology follow-up note   Subjective:    Patient ID: Sophia Wilson, female    DOB: 01/07/1956, PCP Golding, John, MD   Past Medical History:  Diagnosis Date  . Anemia   . Cancer (HCC)    thyroid cancer  . Collagen vascular disease (HCC)   . Diabetes mellitus without complication (HCC)   . Eczema   . Fibroid tumor   . GERD (gastroesophageal reflux disease)   . H/O seasonal allergies   . Herpes simplex without mention of complication    rt thigh  . Hypertension   . Thyroid disease    cancer  . Vitiligo    Past Surgical History:  Procedure Laterality Date  . ABDOMINAL HYSTERECTOMY    . COLONOSCOPY  03/10/2012   Procedure: COLONOSCOPY;  Surgeon: Sandi L Fields, MD;  Location: [facility];  Service: Endoscopy;  Laterality: N/A;  10:30 AM  . THYROIDECTOMY  08/20/2012   Procedure: THYROIDECTOMY;  Surgeon: Mark A Jenkins, MD;  Location: [facility];  Service: General;  Laterality: N/A;  Total Thyroidectomy   Social History   Socioeconomic History  . Marital status: Married    Spouse name: Not on file  . Number of children: 1  . Years of education: Not on file  . Highest education level: Not on file  Occupational History  . Not on file  Tobacco Use  . Smoking status: Never Smoker  . Smokeless tobacco: Never Used  Vaping Use  . Vaping Use: Never used  Substance and Sexual Activity  . Alcohol use: No  . Drug use: No  . Sexual activity: Yes    Birth control/protection: Surgical    Comment: hysterectomy  Other Topics Concern  . Not on file  Social History Narrative  . Not on file   Social Determinants of Health   Financial Resource Strain: Not on file  Food Insecurity: Not on file  Transportation Needs: Not on file  Physical Activity: Not on file  Stress: Not on file  Social Connections: Not on file   Outpatient Encounter Medications as of 09/27/2020  Medication Sig  . aspirin EC 81 MG tablet Take 81 mg by mouth daily.  . [employer] D3 50 MCG  (2000 UT) CAPS TAKE 1 CAPSULE (2,000 UNITS TOTAL) BY MOUTH DAILY WITH BREAKFAST.  . fluticasone (FLONASE) 50 MCG/ACT nasal spray Place 2 sprays into the nose daily as needed. For allergies  . gabapentin (NEURONTIN) 300 MG capsule Take 1 capsule by mouth 3 (three) times daily.  . levothyroxine (SYNTHROID) 100 MCG tablet TAKE (1) TABLET BY MOUTH EACH MORNING 30 MINUTES BEFORE BREAKFAST OR OTHER MEDICATIONS.  . loratadine (CLARITIN) 10 MG tablet Take 10 mg by mouth daily as needed. For allergies  . meloxicam (MOBIC) 15 MG tablet Take 15 mg by mouth daily.  . metFORMIN (GLUCOPHAGE) 500 MG tablet Take 1 tablet (500 mg total) by mouth daily.  . Olmesartan-amLODIPine-HCTZ 40-5-25 MG TABS TAKE (1) TABLET BY MOUTH DAILY - NEED FOLLOW UP FOR MORE REFILLS.  . triamcinolone cream (KENALOG) 0.1 % 1 application as needed.   . [DISCONTINUED] levothyroxine (SYNTHROID) 112 MCG tablet TAKE (1) TABLET BY MOUTH EACH MORNING 30 MINUTES BEFORE BREAKFAST OR OTHER MEDICATIONS.   No facility-administered encounter medications on file as of 09/27/2020.   ALLERGIES: Allergies  Allergen Reactions  . Sulfonamide Derivatives Hives   VACCINATION STATUS: Immunization History  Administered Date(s) Administered  . Influenza,inj,Quad PF,6+ Mos 04/18/2018      HPI -65-year-old female patient with medical history as above.       -She is being seen in follow-up for her history of papillary thyroid cancer, postsurgical hypothyroidism, hypocalcemia, prediabetes. - she underwent thyroidectomy for papillary thyroid cancer in February 2014,  and rTSH stimulated RAI thyroid remnant ablation on 09/05/2012.  her pathology revealed multifocal 1.2, and 0.3 cms follicular variant PTC.  Her surveillance neck ultrasound is negative for any residual thyroid tissue x2, last neck sonogram in 2017.  Second surveillance study with rTSH WBS showed a "focus of abnormal tracer accumulation in mid chest at the midline highly suspicious for  metastatic disease potentially to a mediastinal lymph node". She was sent for CT of thorax/neck with contrast which she underwent on 03/18/2015. This was reported as unremarkable, might have been artifactual. -Subsequent surveillance study with rTSH stimulation showed absence of the previously noticed abnormal tracer accumulation in the mid chest.  - Her most recent whole-body scan with Thyrogen reveals no evidence of distant iodine avid metastasis in November 2018. -Her surveillance thyroid/neck ultrasound on June 24, 2018 was negative for any residual thyroid tissue or recurrence.    -Her most recent Thyrogen stimulated whole-body scan on September 21, 2019 was negative for evidence of tumor recurrence. -Her previsit thyroid/neck ultrasound on August 19, 2020 is negative for thyroid remnant/recurrence.  She is currently on levothyroxine 112 mcg p.o. daily before breakfast.  She reports compliance with medication.  Her previsit thyroid function tests are consistent with slight over replacement.  She denies dysphagia, SOB, nor voice change.  She has no new complaints today. -She continues to feel better.      -She denies any family hx of thyroid dysfunction nor cancer.  -She is on metformin 500 mg p.o. daily at breakfast for prediabetes.  Review of Systems Limited as above.  Objective:    Ht 5' 6" (1.676 m)   Wt 221 lb 12.8 oz (100.6 kg)   BMI 35.80 kg/m   Wt Readings from Last 3 Encounters:  09/27/20 221 lb 12.8 oz (100.6 kg)  03/29/20 227 lb (103 kg)  06/09/19 227 lb (103 kg)    complete Blood Count (Most recent): Lab Results  Component Value Date   WBC 6.3 08/21/2012   HGB 10.0 (L) 08/21/2012   HCT 29.9 (L) 08/21/2012   MCV 81.7 08/21/2012   PLT 241 08/21/2012    Recent Results (from the past 2160 hour(s))  HM DIABETES EYE EXAM     Status: None   Collection Time: 07/15/20 12:00 AM  Result Value Ref Range   HM Diabetic Eye Exam No Retinopathy No Retinopathy  TSH      Status: Abnormal   Collection Time: 09/20/20  9:15 AM  Result Value Ref Range   TSH 0.448 (L) 0.450 - 4.500 uIU/mL  T4, free     Status: Abnormal   Collection Time: 09/20/20  9:15 AM  Result Value Ref Range   Free T4 1.99 (H) 0.82 - 1.77 ng/dL  HgB A1c     Status: Abnormal   Collection Time: 09/27/20 10:13 AM  Result Value Ref Range   Hemoglobin A1C 6.2 (A) 4.0 - 5.6 %   HbA1c POC (<> result, manual entry)     HbA1c, POC (prediabetic range)     HbA1c, POC (controlled diabetic range)      Calcium 10.5 above target on August 27, 2018   Thyroid/neck ultrasound on June 24, 2018 No discrete nodules are seen within the thyroidectomy bed.  IMPRESSION:   No residual or recurrent tissue post total thyroidectomy.   Thyrogen stimulated whole-body scan on September 21, 2019 FINDINGS: No focal uptake in the thyroid bed. Mild uptake in the mediastinum is less than exam from 06/14/2017. Physiologic uptake noted in the salivary glands and GI tract as well as a GU tract.  IMPRESSION: No scintigraphic thyroid cancer recurrence.  August 19, 2020 thyroid/neck ultrasound IMPRESSION: Post total thyroidectomy without evidence of residual or locally recurrent disease.  Assessment & Plan:   1. Malignant neoplasm of thyroid gland (HCC)  - She has had multifocal FVPTC ( 1.2 and 0.3 cms), stage 1 , with no evidence of lymphovascular invasion. She is s/p total thyroidectomy February 2014. She is s/p rTSH stimulated RAI remnant ablation with post therapy scan which is negative for distant spread on 09/05/2012. Her stimulated TG level was 1.9 along with <20 Tg Abs.  her surveillance thyroid u/s is negative for any residual thyroid tissue x 1. Her last neck/ thyroid ultrasound is s/f surgically absent thyroid .  Second surveillance study with rTSH WBS on 02/18/2015 showed a "focus of abnormal tracer accumulation in mid chest at the midline highly suspicious for metastatic disease potentially to a  mediastinal lymph node". - CT with contrast of her chest and neck were unremarkable, might have been artifactual.     Her  rTSH stimulated whole-body scan on 12/09/2015 is reported as unremarkable,  Specifically, focus of abnormal uptake seen within the mid chest on the previous exam is no longer identified.  -  thyroglobulin level is 0.1 improving from 1.9 ,  with antithyroglobulin antibodies at <0.1.  -  Her surveillance thyroid/neck ultrasound on 04/03/2016 was unremarkable,  showing surgically absent thyroid. - Her most recent Thyrogen stimulated whole-body scan from 06/14/2017 and thyroglobulin level before this visit where reassuring with no evidence of distant iodine avid metastasis. -Her previsit unstimulated thyroglobulin level is 0.1 along with thyroglobulin antibodies.  June 24, 2018 surveillance thyroid/neck ultrasound: No residual or recurrent tissue post total thyroidectomy.  -Her most recent Thyrogen stimulated whole-body scan on September 21, 2019 was negative for evidence of tumor recurrence.  Previsit thyroid/neck ultrasound on August 19, 2020 Post total thyroidectomy without evidence of residual or locally recurrent disease.  2. Postsurgical hypothyroidism - Post surgical hypothyroidism on suppressive therapy: Her thyroid function tests are consistent with slight over replacement.  I discussed and lowered her levothyroxine to 100 mcg p.o. daily before breakfast    - We discussed about the correct intake of her thyroid hormone, on empty stomach at fasting, with water, separated by at least 30 minutes from breakfast and other medications,  and separated by more than 4 hours from calcium, iron, multivitamins, acid reflux medications (PPIs). -Patient is made aware of the fact that thyroid hormone replacement is needed for life, dose to be adjusted by periodic monitoring of thyroid function tests.   3.  hypertension  -She is advised to be consistent on her blood pressure  medications including Tribenzor 40-5-20 5 mg p.o. daily.    4. Pre-diabetes -Her point-of-care A1c is 6.2% today increasing from 5.8% during her last visit.  She is tolerating low-dose Metformin.  She is advised to continue Metformin 500 mg p.o. daily after breakfast.  - she acknowledges that there is a room for improvement in her food and drink choices. - Suggestion is made for her to avoid simple carbohydrates  from her diet including Cakes, Sweet Desserts, Ice Cream, Soda (diet and regular), Sweet Tea, Candies, Chips, Cookies, Store  Bought Juices, Alcohol in Excess of  1-2 drinks a day, Artificial Sweeteners,  Coffee Creamer, and "Sugar-free" Products, Lemonade. This will help patient to have more stable blood glucose profile and potentially avoid unintended weight gain.   5) hypercalcemia : Resolved after she stopped her calcium supplements.  She is advised to continue vitamin D3 2000 units daily.    - I advised patient to maintain close follow up with Sharilyn Sites, MD for primary care needs.      - Time spent on this patient care encounter:  35 minutes of which 50% was spent in  counseling and the rest reviewing  her current and  previous labs / studies and medications  doses and developing a plan for long term care, and documenting this care. Geoffery Lyons  participated in the discussions, expressed understanding, and voiced agreement with the above plans.  All questions were answered to her satisfaction. she is encouraged to contact clinic should she have any questions or concerns prior to her return visit.    Follow up plan: Return in about 6 months (around 03/30/2021) for F/U with Pre-visit Labs, A1c -[place].  Glade Lloyd, MD Phone: [phone]  Fax: [phone]  -  This note was partially dictated with voice recognition software. Similar sounding words can be transcribed inadequately or may not  be corrected upon review.  09/27/2020, 11:08 AM

## 2020-10-07 ENCOUNTER — Other Ambulatory Visit: Payer: Self-pay | Admitting: "Endocrinology

## 2020-10-17 DIAGNOSIS — Z1211 Encounter for screening for malignant neoplasm of colon: Secondary | ICD-10-CM | POA: Diagnosis not present

## 2021-01-03 ENCOUNTER — Other Ambulatory Visit: Payer: Self-pay | Admitting: "Endocrinology

## 2021-03-27 DIAGNOSIS — E890 Postprocedural hypothyroidism: Secondary | ICD-10-CM | POA: Diagnosis not present

## 2021-03-27 DIAGNOSIS — R7303 Prediabetes: Secondary | ICD-10-CM | POA: Diagnosis not present

## 2021-03-28 LAB — COMPREHENSIVE METABOLIC PANEL
ALT: 10 IU/L (ref 0–32)
AST: 17 IU/L (ref 0–40)
Albumin/Globulin Ratio: 1.6 (ref 1.2–2.2)
Albumin: 4.5 g/dL (ref 3.8–4.8)
Alkaline Phosphatase: 78 IU/L (ref 44–121)
BUN/Creatinine Ratio: 27 (ref 12–28)
BUN: 27 mg/dL (ref 8–27)
Bilirubin Total: 0.2 mg/dL (ref 0.0–1.2)
CO2: 24 mmol/L (ref 20–29)
Calcium: 9.7 mg/dL (ref 8.7–10.3)
Chloride: 104 mmol/L (ref 96–106)
Creatinine, Ser: 0.99 mg/dL (ref 0.57–1.00)
Globulin, Total: 2.9 g/dL (ref 1.5–4.5)
Glucose: 87 mg/dL (ref 65–99)
Potassium: 4.2 mmol/L (ref 3.5–5.2)
Sodium: 143 mmol/L (ref 134–144)
Total Protein: 7.4 g/dL (ref 6.0–8.5)
eGFR: 64 mL/min/{1.73_m2} (ref >59)

## 2021-03-28 LAB — T4, FREE: Free T4: 1.79 ng/dL — ABNORMAL HIGH (ref 0.82–1.77)

## 2021-03-28 LAB — TSH: TSH: 1.73 u[IU]/mL (ref 0.450–4.500)

## 2021-04-03 ENCOUNTER — Encounter: Payer: Self-pay | Admitting: "Endocrinology

## 2021-04-03 ENCOUNTER — Other Ambulatory Visit: Payer: Self-pay

## 2021-04-03 ENCOUNTER — Ambulatory Visit (INDEPENDENT_AMBULATORY_CARE_PROVIDER_SITE_OTHER): Payer: BC Managed Care – PPO | Admitting: "Endocrinology

## 2021-04-03 VITALS — BP 142/86 | HR 64 | Ht 66.0 in | Wt 225.6 lb

## 2021-04-03 DIAGNOSIS — C73 Malignant neoplasm of thyroid gland: Secondary | ICD-10-CM

## 2021-04-03 DIAGNOSIS — E890 Postprocedural hypothyroidism: Secondary | ICD-10-CM

## 2021-04-03 DIAGNOSIS — R7303 Prediabetes: Secondary | ICD-10-CM | POA: Diagnosis not present

## 2021-04-03 LAB — POCT GLYCOSYLATED HEMOGLOBIN (HGB A1C): HbA1c, POC (controlled diabetic range): 5.8 % (ref 0.0–7.0)

## 2021-04-03 MED ORDER — LEVOTHYROXINE SODIUM 100 MCG PO TABS: ORAL_TABLET | ORAL | 1 refills | Status: DC

## 2021-04-03 MED ORDER — METFORMIN HCL 500 MG PO TABS: 500.0000 mg | ORAL_TABLET | Freq: Every day | ORAL | 1 refills | Status: DC

## 2021-04-03 NOTE — Patient Instructions (Signed)

## 2021-04-04 NOTE — Progress Notes (Signed)
04/04/2021        Endocrinology follow-up note   Subjective:    Patient ID: Sophia Wilson, female    DOB: March 09, 1956, PCP Sharilyn Sites, MD   Past Medical History:  Diagnosis Date   Anemia    Cancer [facility])    thyroid cancer   Collagen vascular disease ([place])    Diabetes mellitus without complication ([place])    Eczema    Fibroid tumor    GERD (gastroesophageal reflux disease)    H/O seasonal allergies    Herpes simplex without mention of complication    rt thigh   Hypertension    Thyroid disease    cancer   Vitiligo    Past Surgical History:  Procedure Laterality Date   ABDOMINAL HYSTERECTOMY     COLONOSCOPY  03/10/2012   Procedure: COLONOSCOPY;  Surgeon: Danie Binder, MD;  Location[facility];  Service: Endoscopy;  Laterality: N/A;  10:30 AM   THYROIDECTOMY  08/20/2012   Procedure: THYROIDECTOMY;  Surgeon: Jamesetta So, MD;  Location: [facility];  Service: General;  Laterality: N/A;  Total Thyroidectomy   Social History   Socioeconomic History   Marital status: Married    Spouse name: Not on file   Number of children: 1   Years of education: Not on file   Highest education level: Not on file  Occupational History   Not on file  Tobacco Use   Smoking status: Never   Smokeless tobacco: Never  Vaping Use   Vaping Use: Never used  Substance and Sexual Activity   Alcohol use: No   Drug use: No   Sexual activity: Yes    Birth control/protection: Surgical    Comment: hysterectomy  Other Topics Concern   Not on file  Social History Narrative   Not on file   Social Determinants of Health   Financial Resource Strain: Not on file  Food Insecurity: Not on file  Transportation Needs: Not on file  Physical Activity: Not on file  Stress: Not on file  Social Connections: Not on file   Outpatient Encounter Medications as of 04/03/2021  Medication Sig   aspirin EC 81 MG tablet Take 81 mg by mouth daily.   [employer] D3 50 MCG (2000 UT) CAPS TAKE 1 CAPSULE (2,000 UNITS  TOTAL) BY MOUTH DAILY WITH BREAKFAST.   fluticasone (FLONASE) 50 MCG/ACT nasal spray Place 2 sprays into the nose daily as needed. For allergies   gabapentin (NEURONTIN) 300 MG capsule Take 1 capsule by mouth 3 (three) times daily.   levothyroxine (SYNTHROID) 100 MCG tablet TAKE (1) TABLET BY MOUTH EACH MORNING 30 MINUTES BEFORE BREAKFAST OR OTHER MEDICATIONS.   loratadine (CLARITIN) 10 MG tablet Take 10 mg by mouth daily as needed. For allergies   meloxicam (MOBIC) 15 MG tablet Take 15 mg by mouth daily.   metFORMIN (GLUCOPHAGE) 500 MG tablet Take 1 tablet (500 mg total) by mouth daily.   Olmesartan-amLODIPine-HCTZ 40-5-25 MG TABS TAKE (1) TABLET BY MOUTH DAILY - NEED FOLLOW UP FOR MORE REFILLS.   triamcinolone cream (KENALOG) 0.1 % 1 application as needed.    [DISCONTINUED] levothyroxine (SYNTHROID) 100 MCG tablet TAKE (1) TABLET BY MOUTH EACH MORNING 30 MINUTES BEFORE BREAKFAST OR OTHER MEDICATIONS.   [DISCONTINUED] metFORMIN (GLUCOPHAGE) 500 MG tablet TAKE ONE TABLET BY MOUTH DAILY.   No facility-administered encounter medications on file as of 04/03/2021.   ALLERGIES: Allergies  Allergen Reactions   Sulfonamide Derivatives Hives   VACCINATION STATUS: Immunization History  Administered Date(s) Administered   Influenza,inj,Quad PF,6+ Mos 04/18/2018    HPI -65 year old female patient with medical history as above.       -She is being seen in follow-up for her history of papillary thyroid cancer, postsurgical hypothyroidism, hypocalcemia, prediabetes. - she underwent thyroidectomy for papillary thyroid cancer in February 2014,  and rTSH stimulated RAI thyroid remnant ablation on 09/05/2012.  her pathology revealed multifocal 1.2, and 0.3 cms follicular variant PTC.  Her surveillance neck ultrasound is negative for any residual thyroid tissue x2, last neck sonogram in 2017.  Second surveillance study with rTSH WBS showed a "focus of abnormal tracer accumulation in mid chest at the  midline highly suspicious for metastatic disease potentially to a mediastinal lymph node". She was sent for CT of thorax/neck with contrast which she underwent on 03/18/2015. This was reported as unremarkable, might have been artifactual. -Subsequent surveillance study with rTSH stimulation showed absence of the previously noticed abnormal tracer accumulation in the mid chest.  - Her most recent whole-body scan with Thyrogen reveals no evidence of distant iodine avid metastasis in November 2018. -Her surveillance thyroid/neck ultrasound on June 24, 2018 was negative for any residual thyroid tissue or recurrence.    -Her most recent Thyrogen stimulated whole-body scan on September 21, 2019 was negative for evidence of tumor recurrence. -Her most recent thyroid/neck ultrasound from February 2022 was negative for thyroid remnant/recurrence.     She is currently on levothyroxine 100 mcg p.o. daily before breakfast.  She reports compliance and consistency taking her medication.  Her previsit thyroid function tests are consistent with appropriate replacement.    She denies dysphagia, SOB, nor voice change.  She has no new complaints today. -She continues to feel better.      -She denies any family hx of thyroid dysfunction nor cancer.  -She is on metformin 500 mg p.o. daily at breakfast for prediabetes.  She returns with improved A1c of 5.8%, from 6.2%.  She continues to tolerate her metformin.  Review of Systems Limited as above.  Objective:    BP (!) 142/86   Pulse 64   Ht _0  (1.676 m)   Wt 225 lb 9.6 oz (102.3 kg)   BMI 36.41 kg/m   Wt Readings from Last 3 Encounters:  04/03/21 225 lb 9.6 oz (102.3 kg)  09/27/20 221 lb 12.8 oz (100.6 kg)  03/29/20 227 lb (103 kg)    complete Blood Count (Most recent): Lab Results  Component Value Date   WBC 6.3 08/21/2012   HGB 10.0 (L) 08/21/2012   HCT 29.9 (L) 08/21/2012   MCV 81.7 08/21/2012   PLT 241 08/21/2012    Recent Results (from  the past 2160 hour(s))  TSH     Status: None   Collection Time: 03/27/21  9:18 AM  Result Value Ref Range   TSH 1.730 0.450 - 4.500 uIU/mL  T4, free     Status: Abnormal   Collection Time: 03/27/21  9:18 AM  Result Value Ref Range   Free T4 1.79 (H) 0.82 - 1.77 ng/dL  Comprehensive metabolic panel     Status: None   Collection Time: 03/27/21  9:18 AM  Result Value Ref Range   Glucose 87 65 - 99 mg/dL   BUN 27 8 - 27 mg/dL   Creatinine, Ser 0.99 0.57 - 1.00 mg/dL   eGFR 64 >59 mL/min/1.73   BUN/Creatinine Ratio 27 12 - 28   Sodium 143 134 - 144 mmol/L   Potassium 4.2 3.5 -  5.2 mmol/L   Chloride 104 96 - 106 mmol/L   CO2 24 20 - 29 mmol/L   Calcium 9.7 8.7 - 10.3 mg/dL   Total Protein 7.4 6.0 - 8.5 g/dL   Albumin 4.5 3.8 - 4.8 g/dL   Globulin, Total 2.9 1.5 - 4.5 g/dL   Albumin/Globulin Ratio 1.6 1.2 - 2.2   Bilirubin Total 0.2 0.0 - 1.2 mg/dL   Alkaline Phosphatase 78 44 - 121 IU/L   AST 17 0 - 40 IU/L   ALT 10 0 - 32 IU/L  HgB A1c     Status: None   Collection Time: 04/03/21 10:32 AM  Result Value Ref Range   Hemoglobin A1C     HbA1c POC (<> result, manual entry)     HbA1c, POC (prediabetic range)     HbA1c, POC (controlled diabetic range) 5.8 0.0 - 7.0 %    Calcium 10.5 above target on August 27, 2018   Thyroid/neck ultrasound on June 24, 2018 No discrete nodules are seen within the thyroidectomy bed.   IMPRESSION: No residual or recurrent tissue post total thyroidectomy.   Thyrogen stimulated whole-body scan on September 21, 2019 FINDINGS: No focal uptake in the thyroid bed. Mild uptake in the mediastinum is less than exam from 06/14/2017. Physiologic uptake noted in the salivary glands and GI tract as well as a GU tract.   IMPRESSION: No scintigraphic thyroid cancer recurrence.  August 19, 2020 thyroid/neck ultrasound IMPRESSION: Post total thyroidectomy without evidence of residual or locally recurrent disease.  Assessment & Plan:   1. Malignant  neoplasm of thyroid gland (HCC)  - She has had multifocal FVPTC ( 1.2 and 0.3 cms), stage 1 , with no evidence of lymphovascular invasion. She is s/p total thyroidectomy February 2014. She is s/p rTSH stimulated RAI remnant ablation with post therapy scan which is negative for distant spread on 09/05/2012. Her stimulated TG level was 1.9 along with <20 Tg Abs.  her surveillance thyroid u/s is negative for any residual thyroid tissue x 1. Her last neck/ thyroid ultrasound is s/f surgically absent thyroid .  Second surveillance study with rTSH WBS on 02/18/2015 showed a "focus of abnormal tracer accumulation in mid chest at the midline highly suspicious for metastatic disease potentially to a mediastinal lymph node". - CT with contrast of her chest and neck were unremarkable, might have been artifactual.     Her  rTSH stimulated whole-body scan on 12/09/2015 is reported as unremarkable,  Specifically, focus of abnormal uptake seen within the mid chest on the previous exam is no longer identified.  -  thyroglobulin level is 0.1 improving from 1.9 ,  with antithyroglobulin antibodies at <0.1.  -  Her surveillance thyroid/neck ultrasound on 04/03/2016 was unremarkable,  showing surgically absent thyroid. - Her most recent Thyrogen stimulated whole-body scan from 06/14/2017 and thyroglobulin level before this visit where reassuring with no evidence of distant iodine avid metastasis. -Her previsit unstimulated thyroglobulin level is 0.1 along with thyroglobulin antibodies.  June 24, 2018 surveillance thyroid/neck ultrasound: No residual or recurrent tissue post total thyroidectomy.  -Her most recent Thyrogen stimulated whole-body scan on September 21, 2019 was negative for evidence of tumor recurrence.  Her last thyroid/neck ultrasound from February 2022 was consistent with total thyroidectomy without evidence of residual or locally recurrent disease.   She will not have imaging study before her  next visit.  2. Postsurgical hypothyroidism - Post surgical hypothyroidism on suppressive therapy: Her thyroid function tests are consistent with appropriate replacement.  She is advised to continue  levothyroxine  100 mcg p.o. daily before breakfast    - We discussed about the correct intake of her thyroid hormone, on empty stomach at fasting, with water, separated by at least 30 minutes from breakfast and other medications,  and separated by more than 4 hours from calcium, iron, multivitamins, acid reflux medications (PPIs). -Patient is made aware of the fact that thyroid hormone replacement is needed for life, dose to be adjusted by periodic monitoring of thyroid function tests.    3.  hypertension  -She is advised to be consistent on her blood pressure medications including Tribenzor 40-5-20 5 mg p.o. daily.    4. Pre-diabetes -Her point-of-care A1c is improving to 5.8% from 6.2%.  She is tolerating her metformin low-dose 500 mg p.o. daily after breakfast.  She is advised to continue.    - We discussed about the correct intake of her thyroid hormone, on empty stomach at fasting, with water, separated by at least 30 minutes from breakfast and other medications,  and separated by more than 4 hours from calcium, iron, multivitamins, acid reflux medications (PPIs). -Patient is made aware of the fact that thyroid hormone replacement is needed for life, dose to be adjusted by periodic monitoring of thyroid function tests.   5) hypercalcemia : Resolved after she stopped her calcium supplements.  She is advised to continue vitamin D3 2000 units daily.    - I advised patient to maintain close follow up with Sharilyn Sites, MD for primary care needs.     I spent 32 minutes in the care of the patient today including review of labs from Thyroid Function, CMP, and other relevant labs ; imaging/biopsy records (current and previous including abstractions from other facilities); face-to-face time  discussing  her lab results and symptoms, medications doses, her options of short and long term treatment based on the latest standards of care / guidelines;   and documenting the encounter.  Sophia Wilson  participated in the discussions, expressed understanding, and voiced agreement with the above plans.  All questions were answered to her satisfaction. she is encouraged to contact clinic should she have any questions or concerns prior to her return visit.    Follow up plan: Return in about 6 months (around 10/01/2021) for F/U with Pre-visit Labs, A1c -[place].  Glade Lloyd, MD Phone: [phone]  Fax: [phone]  -  This note was partially dictated with voice recognition software. Similar sounding words can be transcribed inadequately or may not  be corrected upon review.  04/04/2021, 12:01 PM

## 2021-04-06 ENCOUNTER — Other Ambulatory Visit: Payer: Self-pay | Admitting: "Endocrinology

## 2021-04-20 DIAGNOSIS — Z23 Encounter for immunization: Secondary | ICD-10-CM | POA: Diagnosis not present

## 2021-04-20 DIAGNOSIS — I10 Essential (primary) hypertension: Secondary | ICD-10-CM | POA: Diagnosis not present

## 2021-04-20 DIAGNOSIS — E538 Deficiency of other specified B group vitamins: Secondary | ICD-10-CM | POA: Diagnosis not present

## 2021-04-20 DIAGNOSIS — E1140 Type 2 diabetes mellitus with diabetic neuropathy, unspecified: Secondary | ICD-10-CM | POA: Diagnosis not present

## 2021-04-20 DIAGNOSIS — Z6834 Body mass index (BMI) 34.0-34.9, adult: Secondary | ICD-10-CM | POA: Diagnosis not present

## 2021-04-20 DIAGNOSIS — Z0001 Encounter for general adult medical examination with abnormal findings: Secondary | ICD-10-CM | POA: Diagnosis not present

## 2021-04-20 DIAGNOSIS — H6121 Impacted cerumen, right ear: Secondary | ICD-10-CM | POA: Diagnosis not present

## 2021-04-20 DIAGNOSIS — E6609 Other obesity due to excess calories: Secondary | ICD-10-CM | POA: Diagnosis not present

## 2021-04-20 DIAGNOSIS — E039 Hypothyroidism, unspecified: Secondary | ICD-10-CM | POA: Diagnosis not present

## 2021-05-24 ENCOUNTER — Other Ambulatory Visit (HOSPITAL_COMMUNITY): Payer: Self-pay | Admitting: Family Medicine

## 2021-05-24 DIAGNOSIS — Z1231 Encounter for screening mammogram for malignant neoplasm of breast: Secondary | ICD-10-CM

## 2021-06-26 ENCOUNTER — Other Ambulatory Visit: Payer: Self-pay

## 2021-06-26 ENCOUNTER — Ambulatory Visit (HOSPITAL_COMMUNITY)
Admission: RE | Admit: 2021-06-26 | Discharge: 2021-06-26 | Disposition: A | Discharge status: Home / Self Care | Payer: BC Managed Care – PPO | Source: Ambulatory Visit | Attending: Family Medicine | Admitting: Family Medicine

## 2021-06-26 DIAGNOSIS — Z1231 Encounter for screening mammogram for malignant neoplasm of breast: Secondary | ICD-10-CM | POA: Insufficient documentation

## 2021-08-01 ENCOUNTER — Encounter: Payer: Self-pay | Admitting: Adult Health

## 2021-08-01 ENCOUNTER — Other Ambulatory Visit: Payer: Self-pay

## 2021-08-01 ENCOUNTER — Ambulatory Visit (INDEPENDENT_AMBULATORY_CARE_PROVIDER_SITE_OTHER): Payer: BC Managed Care – PPO | Admitting: Adult Health

## 2021-08-01 VITALS — BP 145/80 | HR 77 | Ht 66.75 in | Wt 221.0 lb

## 2021-08-01 DIAGNOSIS — Z01419 Encounter for gynecological examination (general) (routine) without abnormal findings: Secondary | ICD-10-CM

## 2021-08-01 DIAGNOSIS — Z1211 Encounter for screening for malignant neoplasm of colon: Secondary | ICD-10-CM | POA: Diagnosis not present

## 2021-08-01 DIAGNOSIS — Z90710 Acquired absence of both cervix and uterus: Secondary | ICD-10-CM

## 2021-08-01 LAB — HEMOCCULT GUIAC POC 1CARD (OFFICE): Fecal Occult Blood, POC: NEGATIVE

## 2021-08-01 NOTE — Progress Notes (Signed)
Patient ID: JORETTA EADS, female   DOB: 1956-03-21, 66 y.o.   MRN: [id number] History of Present Illness: Chennel is a 66 year old black female,married, sp hysterectomy in for well woman gyn exam. She is having some sinus drainage. Encouraged to try mucinex. PCP is Dr Hilma Favors   Current Medications, Allergies, Past Medical History, Past Surgical History, Family History and Social History were reviewed in [place] record.     Review of Systems:  Patient denies any headaches, hearing loss, fatigue, blurred vision, shortness of breath, chest pain, abdominal pain, problems with bowel movements, urination, or intercourse. No joint pain or mood swings.  +sinus drainage  Physical Exam:BP (!) 145/80 (BP Location: Right Arm, Patient Position: Sitting, Cuff Size: Normal)    Pulse 77    Ht 5' 6.75" (1.695 m)    Wt 221 lb (100.2 kg)    BMI 34.87 kg/m   General:  Well developed, well nourished, no acute distress Skin:  Warm and dry Neck:  Midline trachea, surgically absent  thyroid, good ROM, no lymphadenopathy,no carotid bruits heard  Lungs; Clear to auscultation bilaterally Breast:  No dominant palpable mass, retraction, or nipple discharge Cardiovascular: Regular rate and rhythm Abdomen:  Soft, non tender, no hepatosplenomegaly Pelvic:  External genitalia is normal in appearance, no lesions.  The vagina is pale with loss of rugae. Urethra has no lesions or masses. The cervix and uterus are absent. No adnexal masses or tenderness noted.Bladder is non tender, no masses felt. Rectal: Good sphincter tone, no polyps, or hemorrhoids felt.  Hemoccult negative. Extremities/musculoskeletal:  No swelling or varicosities noted, no clubbing or cyanosis Psych:  No mood changes, alert and cooperative,seems happy AA is 0 Fall risk is low Depression screen [facility] 2/9 08/01/2021 06/09/2019 06/18/2018  Decreased Interest 0 0 0  Down, Depressed, Hopeless 0 0 0  PHQ - 2 Score 0 0 0  Altered  sleeping - 0 -  Tired, decreased energy - 0 -  Feeling bad or failure about yourself  - 0 -  Trouble concentrating - 0 -  Moving slowly or fidgety/restless - 0 -  Suicidal thoughts - 0 -  PHQ-9 Score - 0 -     Upstream - 08/01/21 0851       Pregnancy Intention Screening   Does the patient want to become pregnant in the next year? N/A    Does the patient's partner want to become pregnant in the next year? N/A    Would the patient like to discuss contraceptive options today? N/A      Contraception Wrap Up   Current Method --   hyst   End Method --   hyst   Contraception Counseling Provided No            Examination chaperoned by Celene Squibb LPN  Impression and Plan: 1. Encounter for well woman exam with routine gynecological exam GYN physical in 2 years Labs with PCP Physical next year with PCP Mammogram yearly Colonoscopy this year  2. Encounter for screening fecal occult blood testing  3. S/P hysterectomy

## 2021-08-04 ENCOUNTER — Other Ambulatory Visit: Payer: Self-pay | Admitting: "Endocrinology

## 2021-09-27 DIAGNOSIS — C73 Malignant neoplasm of thyroid gland: Secondary | ICD-10-CM | POA: Diagnosis not present

## 2021-09-27 DIAGNOSIS — E890 Postprocedural hypothyroidism: Secondary | ICD-10-CM | POA: Diagnosis not present

## 2021-10-03 ENCOUNTER — Ambulatory Visit (INDEPENDENT_AMBULATORY_CARE_PROVIDER_SITE_OTHER): Payer: BC Managed Care – PPO | Admitting: "Endocrinology

## 2021-10-03 ENCOUNTER — Encounter: Payer: Self-pay | Admitting: "Endocrinology

## 2021-10-03 VITALS — BP 130/82 | HR 64 | Ht 66.0 in | Wt 218.4 lb

## 2021-10-03 DIAGNOSIS — E559 Vitamin D deficiency, unspecified: Secondary | ICD-10-CM | POA: Diagnosis not present

## 2021-10-03 DIAGNOSIS — C73 Malignant neoplasm of thyroid gland: Secondary | ICD-10-CM | POA: Diagnosis not present

## 2021-10-03 DIAGNOSIS — R7303 Prediabetes: Secondary | ICD-10-CM | POA: Diagnosis not present

## 2021-10-03 DIAGNOSIS — E890 Postprocedural hypothyroidism: Secondary | ICD-10-CM

## 2021-10-03 LAB — POCT GLYCOSYLATED HEMOGLOBIN (HGB A1C): HbA1c, POC (controlled diabetic range): 5.9 % (ref 0.0–7.0)

## 2021-10-03 MED ORDER — LEVOTHYROXINE SODIUM 100 MCG PO TABS: 100.0000 ug | ORAL_TABLET | Freq: Every day | ORAL | 1 refills | Status: DC

## 2021-10-03 MED ORDER — METFORMIN HCL 500 MG PO TABS: 500.0000 mg | ORAL_TABLET | Freq: Every day | ORAL | 1 refills | Status: DC

## 2021-10-03 NOTE — Patient Instructions (Signed)

## 2021-10-03 NOTE — Progress Notes (Signed)
?10/03/2021 ?    ?   ?Endocrinology follow-up note ? ? ?Subjective:  ? ? Patient ID: Sophia Wilson, female    DOB: [birth year], PCP Sharilyn Sites, MD ? ? ?Past Medical History:  ?Diagnosis Date  ? Anemia   ? Cancer [facility])   ? thyroid cancer  ? Collagen vascular disease ([place])   ? Diabetes mellitus without complication ([place])   ? Eczema   ? Fibroid tumor   ? GERD (gastroesophageal reflux disease)   ? H/O seasonal allergies   ? Herpes simplex without mention of complication   ? rt thigh  ? Hypertension   ? Thyroid disease   ? cancer  ? Vitiligo   ? ?Past Surgical History:  ?Procedure Laterality Date  ? ABDOMINAL HYSTERECTOMY    ? COLONOSCOPY  03/10/2012  ? Procedure: COLONOSCOPY;  Surgeon: Danie Binder, MD;  Location: [facility];  Service: Endoscopy;  Laterality: N/A;  10:30 AM  ? THYROIDECTOMY  08/20/2012  ? Procedure: THYROIDECTOMY;  Surgeon: Jamesetta So, MD;  Location: [facility] ORS;  Service: General;  Laterality: N/A;  Total Thyroidectomy  ? ?Social History  ? ?Socioeconomic History  ? Marital status: Married  ?  Spouse name: Not on file  ? Number of children: 1  ? Years of education: Not on file  ? Highest education level: Not on file  ?Occupational History  ? Not on file  ?Tobacco Use  ? Smoking status: Never  ? Smokeless tobacco: Never  ?Vaping Use  ? Vaping Use: Never used  ?Substance and Sexual Activity  ? Alcohol use: No  ? Drug use: No  ? Sexual activity: Yes  ?  Birth control/protection: Surgical  ?  Comment: hysterectomy  ?Other Topics Concern  ? Not on file  ?Social History Narrative  ? Not on file  ? ?Social Determinants of Health  ? ?Financial Resource Strain: Low Risk   ? Difficulty of Paying Living Expenses: Not very hard  ?Food Insecurity: No Food Insecurity  ? Worried About Charity fundraiser in the Last Year: Never true  ? Ran Out of Food in the Last Year: Never true  ?Transportation Needs: No Transportation Needs  ? Lack of Transportation (Medical): No  ? Lack of Transportation (Non-Medical): No   ?Physical Activity: Inactive  ? Days of Exercise per Week: 0 days  ? Minutes of Exercise per Session: 0 min  ?Stress: No Stress Concern Present  ? Feeling of Stress : Not at all  ?Social Connections: Moderately Integrated  ? Frequency of Communication with Friends and Family: Twice a week  ? Frequency of Social Gatherings with Friends and Family: Once a week  ? Attends Religious Services: 1 to 4 times per year  ? Active Member of Clubs or Organizations: No  ? Attends Archivist Meetings: Never  ? Marital Status: Married  ? ?Outpatient Encounter Medications as of 10/03/2021  ?Medication Sig  ? aspirin EC 81 MG tablet Take 81 mg by mouth daily.  ? CVS D3 50 MCG (2000 UT) CAPS TAKE 1 CAPSULE (2,000 UNITS TOTAL) BY MOUTH DAILY WITH BREAKFAST.  ? fluticasone (FLONASE) 50 MCG/ACT nasal spray Place 2 sprays into the nose daily as needed. For allergies  ? gabapentin (NEURONTIN) 300 MG capsule Take 1 capsule by mouth 3 (three) times daily.  ? levothyroxine (SYNTHROID) 100 MCG tablet Take 1 tablet (100 mcg total) by mouth daily before breakfast.  ? loratadine (CLARITIN) 10 MG tablet Take 10 mg by mouth daily  as needed. For allergies  ? meloxicam (MOBIC) 15 MG tablet Take 15 mg by mouth daily.  ? metFORMIN (GLUCOPHAGE) 500 MG tablet Take 1 tablet (500 mg total) by mouth daily.  ? Olmesartan-amLODIPine-HCTZ 40-5-25 MG TABS TAKE (1) TABLET BY MOUTH DAILY - NEED FOLLOW UP FOR MORE REFILLS.  ? triamcinolone cream (KENALOG) 0.1 % 1 application as needed.   ? [DISCONTINUED] levothyroxine (SYNTHROID) 100 MCG tablet TAKE (1) TABLET BY MOUTH EACH MORNING 30 MINUTES BEFORE BREAKFAST OR OTHER MEDICATIONS.  ? [DISCONTINUED] metFORMIN (GLUCOPHAGE) 500 MG tablet TAKE ONE TABLET BY MOUTH DAILY.  ? ?No facility-administered encounter medications on file as of 10/03/2021.  ? ?ALLERGIES: ?Allergies  ?Allergen Reactions  ? Sulfonamide Derivatives Hives  ? ?VACCINATION STATUS: ?Immunization History  ?Administered Date(s) Administered  ?  Influenza,inj,Quad PF,6+ Mos 04/18/2018  ? ? ?HPI ?-66 year old female patient with medical history as above.       ?-She is being seen in follow-up for her history of papillary thyroid cancer, postsurgical hypothyroidism, hypocalcemia, prediabetes. ?- she underwent thyroidectomy for papillary thyroid cancer in February 2014,  and rTSH stimulated RAI thyroid remnant ablation on 09/05/2012.  ?her pathology revealed multifocal 1.2, and 0.3 cms follicular variant PTC.  ?Her surveillance neck ultrasound is negative for any residual thyroid tissue x2, last neck sonogram in 2017. ? ?Second surveillance study with rTSH WBS showed a "focus of abnormal tracer accumulation in mid chest at the midline highly suspicious for metastatic disease potentially to a mediastinal lymph node". ?She was sent for CT of thorax/neck with contrast which she underwent on 03/18/2015. This was reported as unremarkable, might have been artifactual. ?-Subsequent surveillance study with rTSH stimulation showed absence of the previously noticed abnormal tracer accumulation in the mid chest.  ?- Her most recent whole-body scan with Thyrogen reveals no evidence of distant iodine avid metastasis in November 2018. ?-Her surveillance thyroid/neck ultrasound on June 24, 2018 was negative for any residual thyroid tissue or recurrence.   ? ?-Her most recent Thyrogen stimulated whole-body scan on September 21, 2019 was negative for evidence of tumor recurrence. ?-Her last thyroid/neck ultrasound was from February 2022 which was negative for thyroid remnant/recurrence.   ? ? ?She is currently on levothyroxine 100 mcg p.o. daily before breakfast.  She reports compliance and consistency with her medication.  Her previsit thyroid function tests are consistent with appropriate replacement.   ? ?She denies dysphagia, SOB, nor voice change.  She has no new complaints today. ?-She continues to feel better.   ?   ?-She denies any family hx of thyroid dysfunction nor  cancer.  ?-She is on metformin 500 mg p.o. daily at breakfast.  She returns with continued improvement in her point-of-care A1c at 5.9%.  She is not monitoring blood glucose regularly.   ? ? ? ?Review of Systems ?Limited as above. ? ?Objective:  ?  ?BP 130/82   Pulse 64   Ht _0  (1.676 m)   Wt 218 lb 6.4 oz (99.1 kg)   BMI 35.25 kg/m?   ?Wt Readings from Last 3 Encounters:  ?10/03/21 218 lb 6.4 oz (99.1 kg)  ?08/01/21 221 lb (100.2 kg)  ?04/03/21 225 lb 9.6 oz (102.3 kg)  ?  ?complete Blood Count (Most recent): ?Lab Results  ?Component Value Date  ? WBC 6.3 08/21/2012  ? HGB 10.0 (L) 08/21/2012  ? HCT 29.9 (L) 08/21/2012  ? MCV 81.7 08/21/2012  ? PLT 241 08/21/2012  ? ? ?Recent Results (from the past 2160 hour(s))  ?  POCT occult blood stool     Status: None  ? Collection Time: 08/01/21  9:08 AM  ?Result Value Ref Range  ? Fecal Occult Blood, POC Negative Negative  ? Card #1 Date    ? Card #2 Fecal Occult Blod, POC    ? Card #2 Date    ? Card #3 Fecal Occult Blood, POC    ? Card #3 Date    ?TSH     Status: None  ? Collection Time: 09/27/21  9:11 AM  ?Result Value Ref Range  ? TSH 1.640 0.450 - 4.500 uIU/mL  ?T4, free     Status: None  ? Collection Time: 09/27/21  9:11 AM  ?Result Value Ref Range  ? Free T4 1.60 0.82 - 1.77 ng/dL  ?Thyroglobulin Level     Status: None (Preliminary result)  ? Collection Time: 09/27/21  9:11 AM  ?Result Value Ref Range  ? Thyroglobulin (TG-RIA) WILL FOLLOW   ?HgB A1c     Status: None  ? Collection Time: 10/03/21  9:59 AM  ?Result Value Ref Range  ? Hemoglobin A1C    ? HbA1c POC (<> result, manual entry)    ? HbA1c, POC (prediabetic range)    ? HbA1c, POC (controlled diabetic range) 5.9 0.0 - 7.0 %  ? ? ?Calcium 10.5 above target on August 27, 2018 ? ? ?Thyroid/neck ultrasound on June 24, 2018 ?No discrete nodules are seen within the thyroidectomy bed. ?  ?IMPRESSION: ?No residual or recurrent tissue post total thyroidectomy. ? ? ?Thyrogen stimulated whole-body scan on September 21, 2019 ?FINDINGS: ?No focal uptake in the thyroid bed. Mild uptake in the mediastinum ?is less than exam from 06/14/2017. Physiologic uptake noted in the ?salivary glands and GI tract as well as a GU tract.

## 2021-10-06 LAB — TSH: TSH: 1.64 u[IU]/mL (ref 0.450–4.500)

## 2021-10-06 LAB — THYROGLOBULIN LEVEL: Thyroglobulin (TG-RIA): <2 ng/mL

## 2021-10-06 LAB — T4, FREE: Free T4: 1.6 ng/dL (ref 0.82–1.77)

## 2021-12-04 DIAGNOSIS — Z7984 Long term (current) use of oral hypoglycemic drugs: Secondary | ICD-10-CM | POA: Diagnosis not present

## 2021-12-04 DIAGNOSIS — E119 Type 2 diabetes mellitus without complications: Secondary | ICD-10-CM | POA: Diagnosis not present

## 2021-12-04 DIAGNOSIS — H2513 Age-related nuclear cataract, bilateral: Secondary | ICD-10-CM | POA: Diagnosis not present

## 2021-12-04 LAB — HM DIABETES EYE EXAM

## 2022-01-23 ENCOUNTER — Encounter: Payer: Self-pay | Admitting: *Deleted

## 2022-01-29 DIAGNOSIS — E538 Deficiency of other specified B group vitamins: Secondary | ICD-10-CM | POA: Diagnosis not present

## 2022-01-29 DIAGNOSIS — E7849 Other hyperlipidemia: Secondary | ICD-10-CM | POA: Diagnosis not present

## 2022-01-29 DIAGNOSIS — E782 Mixed hyperlipidemia: Secondary | ICD-10-CM | POA: Diagnosis not present

## 2022-01-29 DIAGNOSIS — Z6832 Body mass index (BMI) 32.0-32.9, adult: Secondary | ICD-10-CM | POA: Diagnosis not present

## 2022-01-29 DIAGNOSIS — R1310 Dysphagia, unspecified: Secondary | ICD-10-CM | POA: Diagnosis not present

## 2022-01-29 DIAGNOSIS — I10 Essential (primary) hypertension: Secondary | ICD-10-CM | POA: Diagnosis not present

## 2022-01-29 DIAGNOSIS — D649 Anemia, unspecified: Secondary | ICD-10-CM | POA: Diagnosis not present

## 2022-01-31 ENCOUNTER — Other Ambulatory Visit: Payer: Self-pay | Admitting: "Endocrinology

## 2022-02-02 ENCOUNTER — Encounter: Payer: Self-pay | Admitting: Internal Medicine

## 2022-03-08 NOTE — Progress Notes (Signed)
Referring Provider: Sharilyn Sites, MD Primary Care Physician:  Sharilyn Sites, MD Primary Gastroenterologist:  Dr. Abbey Chatters  Chief Complaint  Patient presents with   Dysphagia    Food gets stuck in throat sometimes also time for a colonoscopy     HPI:   Sophia Wilson is a 66 y.o. female presenting today at the request of Sharilyn Sites, MD for dysphagia.  We have not seen patient since August 2013 at the time of screening colonoscopy which revealed moderate size internal hemorrhoids, otherwise normal exam with recommendations to repeat in 10 years.   Today:   Dysphagia:  Started 1 year ago. Occurs with solid foods and pills. Occasional regurgitation. No pain with swallowing. Was started on pantoprazole in July. Slight improvement, but still with symptoms a couple times a week. Was having some intermittent heartburn a couple times a week for the last year using tums as needed until she was started on pantoprazole. Heartburn is now well controlled.   No nausea, vomiting, abdominal pain.   Takes meloxicam daily for arthritis in her back mostly.   No unintentional weight loss. Bowels moving well, every other day to every 2 days at baseline. No hard stools ot straiing. No brbpr or melena.   Past Medical History:  Diagnosis Date   Anemia    Cancer ([place])    thyroid cancer   Collagen vascular disease ([place])    Diabetes mellitus without complication ([place])    Eczema    Fibroid tumor    GERD (gastroesophageal reflux disease)    H/O seasonal allergies    Herpes simplex without mention of complication    rt thigh   Hypertension    Thyroid disease    cancer   Vitiligo     Past Surgical History:  Procedure Laterality Date   ABDOMINAL HYSTERECTOMY     COLONOSCOPY  03/10/2012   Surgeon: Danie Binder, MD;  moderate size internal hemorrhoids, otherwise normal exam with recommendations to repeat in 10 years.   THYROIDECTOMY  08/20/2012   Procedure: THYROIDECTOMY;  Surgeon: Jamesetta So, MD;  Location: [facility] ORS;  Service: General;  Laterality: N/A;  Total Thyroidectomy    Current Outpatient Medications  Medication Sig Dispense Refill   aspirin EC 81 MG tablet Take 81 mg by mouth daily.     CVS D3 50 MCG (2000 UT) CAPS TAKE 1 CAPSULE (2,000 UNITS TOTAL) BY MOUTH DAILY WITH BREAKFAST. 100 capsule 2   fluticasone (FLONASE) 50 MCG/ACT nasal spray Place 2 sprays into the nose daily as needed. For allergies     gabapentin (NEURONTIN) 300 MG capsule Take 1 capsule by mouth 3 (three) times daily.     levothyroxine (SYNTHROID) 100 MCG tablet TAKE 1 TABLET BY MOUTH DAILY BEFORE BREAKFAST. 90 tablet 0   loratadine (CLARITIN) 10 MG tablet Take 10 mg by mouth daily as needed. For allergies     meloxicam (MOBIC) 15 MG tablet Take 15 mg by mouth daily.     metFORMIN (GLUCOPHAGE) 500 MG tablet Take 1 tablet (500 mg total) by mouth daily. 90 tablet 1   Olmesartan-amLODIPine-HCTZ 40-5-25 MG TABS TAKE (1) TABLET BY MOUTH DAILY - NEED FOLLOW UP FOR MORE REFILLS. 90 tablet 0   pantoprazole (PROTONIX) 40 MG tablet Take 40 mg by mouth daily.     triamcinolone cream (KENALOG) 0.1 % 1 application as needed.      No current facility-administered medications for this visit.    Allergies as of 03/12/2022 - Review  Complete 03/12/2022  Allergen Reaction Noted   Sulfonamide derivatives Hives 02/25/2012    Family History  Problem Relation Age of Onset   Cancer Mother        lung    Other Daughter        blood clot on lung   Colon cancer Neg Hx    Esophageal cancer Neg Hx     Social History   Socioeconomic History   Marital status: Married    Spouse name: Not on file   Number of children: 1   Years of education: Not on file   Highest education level: Not on file  Occupational History   Not on file  Tobacco Use   Smoking status: Never   Smokeless tobacco: Never  Vaping Use   Vaping Use: Never used  Substance and Sexual Activity   Alcohol use: No   Drug use: No   Sexual  activity: Yes    Birth control/protection: Surgical    Comment: hysterectomy  Other Topics Concern   Not on file  Social History Narrative   Not on file   Social Determinants of Health   Financial Resource Strain: Low Risk  (08/01/2021)   Overall Financial Resource Strain (CARDIA)    Difficulty of Paying Living Expenses: Not very hard  Food Insecurity: No Food Insecurity (08/01/2021)   Hunger Vital Sign    Worried About Running Out of Food in the Last Year: Never true    Ran Out of Food in the Last Year: Never true  Transportation Needs: No Transportation Needs (08/01/2021)   PRAPARE - Hydrologist (Medical): No    Lack of Transportation (Non-Medical): No  Physical Activity: Inactive (08/01/2021)   Exercise Vital Sign    Days of Exercise per Week: 0 days    Minutes of Exercise per Session: 0 min  Stress: No Stress Concern Present (08/01/2021)   [place]    Feeling of Stress : Not at all  Social Connections: Moderately Integrated (08/01/2021)   Social Connection and Isolation Panel [NHANES]    Frequency of Communication with Friends and Family: Twice a week    Frequency of Social Gatherings with Friends and Family: Once a week    Attends Religious Services: 1 to 4 times per year    Active Member of [employer] or Organizations: No    Attends Archivist Meetings: Never    Marital Status: Married  Human resources officer Violence: Not At Risk (08/01/2021)   Humiliation, Afraid, Rape, and Kick questionnaire    Fear of Current or Ex-Partner: No    Emotionally Abused: No    Physically Abused: No    Sexually Abused: No    Review of Systems: Gen: Denies any fever, chills, cold or flu like symptoms, pre-syncope, or syncope.  CV: Denies chest pain, heart palpitations. Resp: Denies shortness of breath, cough.   GI: See HPI GU : Denies urinary burning, urinary frequency, urinary hesitancy MS:  Denies joint pain. Derm: Denies rash. Psych: Denies depression, anxiety. Heme: See HPI  Physical Exam: BP (!) 169/88 (BP Location: Left Arm, Patient Position: Sitting, Cuff Size: Normal)   Pulse 74   Temp 98 F (36.7 C) (Temporal)   Ht '5\' 7"'$  (1.702 m)   Wt 214 lb 6.4 oz (97.3 kg)   BMI 33.58 kg/m  General:   Alert and oriented. Pleasant and cooperative. Well-nourished and well-developed.  Head:  Normocephalic and atraumatic. Eyes:  Without icterus, sclera clear and conjunctiva pink.  Ears:  Normal auditory acuity. Lungs:  Clear to auscultation bilaterally. No wheezes, rales, or rhonchi. No distress.  Heart:  S1, S2 present without murmurs appreciated.  Abdomen:  +BS, soft, non-tender and non-distended. No HSM noted. No guarding or rebound. No masses appreciated.  Rectal:  Deferred  Msk:  Symmetrical without gross deformities. Normal posture. Extremities:  Without edema. Neurologic:  Alert and  oriented x4;  grossly normal neurologically. Skin:  Intact without significant lesions or rashes. Psych:  Normal mood and affect.    Assessment:  66 year old female with history of thyroid cancer, diabetes, HTN, reflux, presenting today with chief complaint of dysphagia.  Dysphagia:  1 year history of intermittent solid food and pill dysphagia with items getting hung around the sternal notch.  Occasional regurgitation.  Admits to intermittent heartburn as well for the last year using Tums as needed until she was recently started on pantoprazole in July.  Heartburn is now well controlled and she is also had slight improvement in dysphagia, but continues with symptoms couple times a week.  No unintentional weight loss.  She may have esophagitis, esophageal web, ring, stricture in the setting of reflux.  Less likely malignancy.  She need an EGD for further evaluation.  GERD:  Intermittent heartburn a couple times a week for the last year.  Symptoms now well controlled on pantoprazole 40 mg  daily which was started in July per PCP.  Colon cancer screening: Due for colon cancer screening.  Last colonoscopy in August 2013 with internal hemorrhoids, has normal exam.  She has no significant lower GI symptoms or alarm symptoms.  No family history of colon cancer.    Plan:  Proceed with upper endoscopy +/- dilation + colonoscopy with propofol by Dr. Abbey Chatters in near future. The risks, benefits, and alternatives have been discussed with the patient in detail. The patient states understanding and desires to proceed.  ASA 2 BMP Separate instructions provided for diabetes medication adjustments. Dysphagia precautions:  Eat slowly, take small bites, chew thoroughly, drink plenty of liquids throughout meals.  Avoid trough textures All meats should be chopped finely.  If something gets hung in your esophagus and will not come up or go down, proceed to the emergency room.   Continue pantoprazole 40 mg daily 30 minutes before breakfast. Follow-up after procedures.   Aliene Altes, PA-C [facility] 03/12/2022

## 2022-03-08 NOTE — H&P (View-Only) (Signed)
Referring Provider: Sharilyn Sites, MD Primary Care Physician:  Sharilyn Sites, MD Primary Gastroenterologist:  Dr. Abbey Chatters  Chief Complaint  Patient presents with   Dysphagia    Food gets stuck in throat sometimes also time for a colonoscopy     HPI:   Sophia Wilson is a 66 y.o. female presenting today at the request of Sharilyn Sites, MD for dysphagia.  We have not seen patient since August 2013 at the time of screening colonoscopy which revealed moderate size internal hemorrhoids, otherwise normal exam with recommendations to repeat in 10 years.   Today:   Dysphagia:  Started 1 year ago. Occurs with solid foods and pills. Occasional regurgitation. No pain with swallowing. Was started on pantoprazole in July. Slight improvement, but still with symptoms a couple times a week. Was having some intermittent heartburn a couple times a week for the last year using tums as needed until she was started on pantoprazole. Heartburn is now well controlled.   No nausea, vomiting, abdominal pain.   Takes meloxicam daily for arthritis in her back mostly.   No unintentional weight loss. Bowels moving well, every other day to every 2 days at baseline. No hard stools ot straiing. No brbpr or melena.   Past Medical History:  Diagnosis Date   Anemia    Cancer ([place])    thyroid cancer   Collagen vascular disease ([place])    Diabetes mellitus without complication ([place])    Eczema    Fibroid tumor    GERD (gastroesophageal reflux disease)    H/O seasonal allergies    Herpes simplex without mention of complication    rt thigh   Hypertension    Thyroid disease    cancer   Vitiligo     Past Surgical History:  Procedure Laterality Date   ABDOMINAL HYSTERECTOMY     COLONOSCOPY  03/10/2012   Surgeon: Danie Binder, MD;  moderate size internal hemorrhoids, otherwise normal exam with recommendations to repeat in 10 years.   THYROIDECTOMY  08/20/2012   Procedure: THYROIDECTOMY;  Surgeon: Jamesetta So, MD;  Location: [facility];  Service: General;  Laterality: N/A;  Total Thyroidectomy    Current Outpatient Medications  Medication Sig Dispense Refill   aspirin EC 81 MG tablet Take 81 mg by mouth daily.     [employer]S D3 50 MCG (2000 UT) CAPS TAKE 1 CAPSULE (2,000 UNITS TOTAL) BY MOUTH DAILY WITH BREAKFAST. 100 capsule 2   fluticasone (FLONASE) 50 MCG/ACT nasal spray Place 2 sprays into the nose daily as needed. For allergies     gabapentin (NEURONTIN) 300 MG capsule Take 1 capsule by mouth 3 (three) times daily.     levothyroxine (SYNTHROID) 100 MCG tablet TAKE 1 TABLET BY MOUTH DAILY BEFORE BREAKFAST. 90 tablet 0   loratadine (CLARITIN) 10 MG tablet Take 10 mg by mouth daily as needed. For allergies     meloxicam (MOBIC) 15 MG tablet Take 15 mg by mouth daily.     metFORMIN (GLUCOPHAGE) 500 MG tablet Take 1 tablet (500 mg total) by mouth daily. 90 tablet 1   Olmesartan-amLODIPine-HCTZ 40-5-25 MG TABS TAKE (1) TABLET BY MOUTH DAILY - NEED FOLLOW UP FOR MORE REFILLS. 90 tablet 0   pantoprazole (PROTONIX) 40 MG tablet Take 40 mg by mouth daily.     triamcinolone cream (KENALOG) 0.1 % 1 application as needed.      No current facility-administered medications for this visit.    Allergies as of 03/12/2022 - Review  Complete 03/12/2022  Allergen Reaction Noted   Sulfonamide derivatives Hives 02/25/2012    Family History  Problem Relation Age of Onset   Cancer Mother        lung    Other Daughter        blood clot on lung   Colon cancer Neg Hx    Esophageal cancer Neg Hx     Social History   Socioeconomic History   Marital status: Married    Spouse name: Not on file   Number of children: 1   Years of education: Not on file   Highest education level: Not on file  Occupational History   Not on file  Tobacco Use   Smoking status: Never   Smokeless tobacco: Never  Vaping Use   Vaping Use: Never used  Substance and Sexual Activity   Alcohol use: No   Drug use: No   Sexual  activity: Yes    Birth control/protection: Surgical    Comment: hysterectomy  Other Topics Concern   Not on file  Social History Narrative   Not on file   Social Determinants of Health   Financial Resource Strain: Low Risk  (08/01/2021)   Overall Financial Resource Strain (CARDIA)    Difficulty of Paying Living Expenses: Not very hard  Food Insecurity: No Food Insecurity (08/01/2021)   Hunger Vital Sign    Worried About Running Out of Food in the Last Year: Never true    Ran Out of Food in the Last Year: Never true  Transportation Needs: No Transportation Needs (08/01/2021)   PRAPARE - Hydrologist (Medical): No    Lack of Transportation (Non-Medical): No  Physical Activity: Inactive (08/01/2021)   Exercise Vital Sign    Days of Exercise per Week: 0 days    Minutes of Exercise per Session: 0 min  Stress: No Stress Concern Present (08/01/2021)   [place]    Feeling of Stress : Not at all  Social Connections: Moderately Integrated (08/01/2021)   Social Connection and Isolation Panel [NHANES]    Frequency of Communication with Friends and Family: Twice a week    Frequency of Social Gatherings with Friends and Family: Once a week    Attends Religious Services: 1 to 4 times per year    Active Member of [employer] or Organizations: No    Attends Archivist Meetings: Never    Marital Status: Married  Human resources officer Violence: Not At Risk (08/01/2021)   Humiliation, Afraid, Rape, and Kick questionnaire    Fear of Current or Ex-Partner: No    Emotionally Abused: No    Physically Abused: No    Sexually Abused: No    Review of Systems: Gen: Denies any fever, chills, cold or flu like symptoms, pre-syncope, or syncope.  CV: Denies chest pain, heart palpitations. Resp: Denies shortness of breath, cough.   GI: See HPI GU : Denies urinary burning, urinary frequency, urinary hesitancy MS:  Denies joint pain. Derm: Denies rash. Psych: Denies depression, anxiety. Heme: See HPI  Physical Exam: BP (!) 169/88 (BP Location: Left Arm, Patient Position: Sitting, Cuff Size: Normal)   Pulse 74   Temp 98 F (36.7 C) (Temporal)   Ht '5\' 7"'$  (1.702 m)   Wt 214 lb 6.4 oz (97.3 kg)   BMI 33.58 kg/m  General:   Alert and oriented. Pleasant and cooperative. Well-nourished and well-developed.  Head:  Normocephalic and atraumatic. Eyes:  Without icterus, sclera clear and conjunctiva pink.  Ears:  Normal auditory acuity. Lungs:  Clear to auscultation bilaterally. No wheezes, rales, or rhonchi. No distress.  Heart:  S1, S2 present without murmurs appreciated.  Abdomen:  +BS, soft, non-tender and non-distended. No HSM noted. No guarding or rebound. No masses appreciated.  Rectal:  Deferred  Msk:  Symmetrical without gross deformities. Normal posture. Extremities:  Without edema. Neurologic:  Alert and  oriented x4;  grossly normal neurologically. Skin:  Intact without significant lesions or rashes. Psych:  Normal mood and affect.    Assessment:  66 year old female with history of thyroid cancer, diabetes, HTN, reflux, presenting today with chief complaint of dysphagia.  Dysphagia:  1 year history of intermittent solid food and pill dysphagia with items getting hung around the sternal notch.  Occasional regurgitation.  Admits to intermittent heartburn as well for the last year using Tums as needed until she was recently started on pantoprazole in July.  Heartburn is now well controlled and she is also had slight improvement in dysphagia, but continues with symptoms couple times a week.  No unintentional weight loss.  She may have esophagitis, esophageal web, ring, stricture in the setting of reflux.  Less likely malignancy.  She need an EGD for further evaluation.  GERD:  Intermittent heartburn a couple times a week for the last year.  Symptoms now well controlled on pantoprazole 40 mg  daily which was started in July per PCP.  Colon cancer screening: Due for colon cancer screening.  Last colonoscopy in August 2013 with internal hemorrhoids, has normal exam.  She has no significant lower GI symptoms or alarm symptoms.  No family history of colon cancer.    Plan:  Proceed with upper endoscopy +/- dilation + colonoscopy with propofol by Dr. Abbey Chatters in near future. The risks, benefits, and alternatives have been discussed with the patient in detail. The patient states understanding and desires to proceed.  ASA 2 BMP Separate instructions provided for diabetes medication adjustments. Dysphagia precautions:  Eat slowly, take small bites, chew thoroughly, drink plenty of liquids throughout meals.  Avoid trough textures All meats should be chopped finely.  If something gets hung in your esophagus and will not come up or go down, proceed to the emergency room.   Continue pantoprazole 40 mg daily 30 minutes before breakfast. Follow-up after procedures.   Aliene Altes, PA-C [facility] 03/12/2022

## 2022-03-12 ENCOUNTER — Encounter: Payer: Self-pay | Admitting: *Deleted

## 2022-03-12 ENCOUNTER — Telehealth: Payer: Self-pay | Admitting: Gastroenterology

## 2022-03-12 ENCOUNTER — Encounter: Payer: Self-pay | Admitting: Gastroenterology

## 2022-03-12 ENCOUNTER — Telehealth: Payer: Self-pay | Admitting: *Deleted

## 2022-03-12 ENCOUNTER — Ambulatory Visit: Payer: BC Managed Care – PPO | Admitting: Gastroenterology

## 2022-03-12 VITALS — BP 169/88 | HR 74 | Temp 98.0°F | Ht 67.0 in | Wt 214.4 lb

## 2022-03-12 DIAGNOSIS — Z1211 Encounter for screening for malignant neoplasm of colon: Secondary | ICD-10-CM | POA: Diagnosis not present

## 2022-03-12 DIAGNOSIS — R1310 Dysphagia, unspecified: Secondary | ICD-10-CM

## 2022-03-12 DIAGNOSIS — K219 Gastro-esophageal reflux disease without esophagitis: Secondary | ICD-10-CM | POA: Diagnosis not present

## 2022-03-12 MED ORDER — PEG 3350-KCL-NA BICARB-NACL 420 G PO SOLR: 4000.0000 mL | Freq: Once | ORAL | 0 refills | Status: AC

## 2022-03-12 NOTE — Telephone Encounter (Signed)
Patient has been scheduled for 04/10/22 at 9:15 am. She is aware that she needs to have a BMP prior to procedure. Instructions will be mailed and prep sent to the pharmacy.

## 2022-03-12 NOTE — Patient Instructions (Signed)
We will arrange to have an upper endoscopy with possible stretching of your esophagus and colonoscopy in the near future with Dr. Abbey Chatters. 1 day prior to procedure: Take metformin as prescribed. Day of procedure: Do not take any morning diabetes medications.  Swallowing precautions:  Eat slowly, take small bites, chew thoroughly, drink plenty of liquids throughout meals.  Avoid trough textures All meats should be chopped finely.  If something gets hung in your esophagus and will not come up or go down, proceed to the emergency room.    Continue taking pantoprazole 40 mg daily 30 minutes before breakfast.  We will follow-up with you in the office after your procedures.  Do not hesitate to call if you have any questions or concerns prior to your next visit.  It was very nice meeting you today!  Aliene Altes, PA-C [facility]

## 2022-03-12 NOTE — Telephone Encounter (Signed)
Patient will need BMP prior to planned EGD/TCS.

## 2022-03-12 NOTE — Telephone Encounter (Signed)
Per Francesca Jewett at [facility], No PA is required for the colonoscopy/EGD.

## 2022-04-02 ENCOUNTER — Other Ambulatory Visit (HOSPITAL_COMMUNITY)
Admission: RE | Admit: 2022-04-02 | Discharge: 2022-04-02 | Disposition: A | Discharge status: Home / Self Care | Payer: BC Managed Care – PPO | Source: Ambulatory Visit | Attending: "Endocrinology | Admitting: "Endocrinology

## 2022-04-02 ENCOUNTER — Other Ambulatory Visit (HOSPITAL_COMMUNITY)
Admission: RE | Admit: 2022-04-02 | Discharge: 2022-04-02 | Disposition: A | Discharge status: Home / Self Care | Payer: BC Managed Care – PPO | Source: Ambulatory Visit | Attending: Gastroenterology | Admitting: Gastroenterology

## 2022-04-02 DIAGNOSIS — R1310 Dysphagia, unspecified: Secondary | ICD-10-CM | POA: Diagnosis not present

## 2022-04-02 DIAGNOSIS — Z1211 Encounter for screening for malignant neoplasm of colon: Secondary | ICD-10-CM

## 2022-04-02 DIAGNOSIS — E890 Postprocedural hypothyroidism: Secondary | ICD-10-CM | POA: Insufficient documentation

## 2022-04-02 DIAGNOSIS — K219 Gastro-esophageal reflux disease without esophagitis: Secondary | ICD-10-CM

## 2022-04-02 LAB — BASIC METABOLIC PANEL
Anion gap: 4 — ABNORMAL LOW (ref 5–15)
BUN: 24 mg/dL — ABNORMAL HIGH (ref 8–23)
CO2: 29 mmol/L (ref 22–32)
Calcium: 9.2 mg/dL (ref 8.9–10.3)
Chloride: 106 mmol/L (ref 98–111)
Creatinine, Ser: 0.85 mg/dL (ref 0.44–1.00)
GFR, Estimated: >60 mL/min (ref >60)
Glucose, Bld: 100 mg/dL — ABNORMAL HIGH (ref 70–99)
Potassium: 4 mmol/L (ref 3.5–5.1)
Sodium: 139 mmol/L (ref 135–145)

## 2022-04-02 LAB — T4, FREE: Free T4: 1.33 ng/dL — ABNORMAL HIGH (ref 0.61–1.12)

## 2022-04-02 LAB — TSH: TSH: 1.749 u[IU]/mL (ref 0.350–4.500)

## 2022-04-06 ENCOUNTER — Encounter (HOSPITAL_COMMUNITY)
Admission: RE | Admit: 2022-04-06 | Discharge: 2022-04-06 | Disposition: A | Discharge status: Home / Self Care | Payer: BC Managed Care – PPO | Source: Ambulatory Visit | Attending: Internal Medicine | Admitting: Internal Medicine

## 2022-04-06 ENCOUNTER — Encounter (HOSPITAL_COMMUNITY): Payer: Self-pay

## 2022-04-09 ENCOUNTER — Other Ambulatory Visit: Payer: Self-pay | Admitting: "Endocrinology

## 2022-04-09 ENCOUNTER — Encounter: Payer: Self-pay | Admitting: "Endocrinology

## 2022-04-09 ENCOUNTER — Ambulatory Visit: Payer: BC Managed Care – PPO | Admitting: "Endocrinology

## 2022-04-09 VITALS — BP 132/84 | HR 76 | Ht 67.0 in | Wt 214.8 lb

## 2022-04-09 DIAGNOSIS — E559 Vitamin D deficiency, unspecified: Secondary | ICD-10-CM | POA: Diagnosis not present

## 2022-04-09 DIAGNOSIS — E890 Postprocedural hypothyroidism: Secondary | ICD-10-CM | POA: Diagnosis not present

## 2022-04-09 DIAGNOSIS — C73 Malignant neoplasm of thyroid gland: Secondary | ICD-10-CM

## 2022-04-09 DIAGNOSIS — R7303 Prediabetes: Secondary | ICD-10-CM

## 2022-04-09 LAB — POCT GLYCOSYLATED HEMOGLOBIN (HGB A1C): HbA1c, POC (controlled diabetic range): 5.9 % (ref 0.0–7.0)

## 2022-04-09 MED ORDER — LEVOTHYROXINE SODIUM 88 MCG PO TABS: 88.0000 ug | ORAL_TABLET | Freq: Every day | ORAL | 1 refills | Status: DC

## 2022-04-09 NOTE — Progress Notes (Signed)
04/09/2022       Endocrinology follow-up note  Subjective:    Patient ID: Sophia Wilson, female    DOB: [birth year], PCP Sharilyn Sites, MD   Past Medical History:  Diagnosis Date   Anemia    Cancer [facility])    thyroid cancer   Collagen vascular disease ([place])    Diabetes mellitus without complication ([place])    Eczema    Fibroid tumor    GERD (gastroesophageal reflux disease)    H/O seasonal allergies    Herpes simplex without mention of complication    rt thigh   Hypertension    Thyroid disease    cancer   Vitiligo    Past Surgical History:  Procedure Laterality Date   ABDOMINAL HYSTERECTOMY     COLONOSCOPY  03/10/2012   Surgeon: Danie Binder, MD;  moderate size internal hemorrhoids, otherwise normal exam with recommendations to repeat in 10 years.   THYROIDECTOMY  08/20/2012   Procedure: THYROIDECTOMY;  Surgeon: Jamesetta So, MD;  Location: [facility];  Service: General;  Laterality: N/A;  Total Thyroidectomy   Social History   Socioeconomic History   Marital status: Married    Spouse name: Not on file   Number of children: 1   Years of education: Not on file   Highest education level: Not on file  Occupational History   Not on file  Tobacco Use   Smoking status: Never   Smokeless tobacco: Never  Vaping Use   Vaping Use: Never used  Substance and Sexual Activity   Alcohol use: No   Drug use: No   Sexual activity: Yes    Birth control/protection: Surgical    Comment: hysterectomy  Other Topics Concern   Not on file  Social History Narrative   Not on file   Social Determinants of Health   Financial Resource Strain: Low Risk  (08/01/2021)   Overall Financial Resource Strain (CARDIA)    Difficulty of Paying Living Expenses: Not very hard  Food Insecurity: No Food Insecurity (08/01/2021)   Hunger Vital Sign    Worried About Running Out of Food in the Last Year: Never true    Ran Out of Food in the Last Year: Never true  Transportation Needs: No  Transportation Needs (08/01/2021)   PRAPARE - Hydrologist (Medical): No    Lack of Transportation (Non-Medical): No  Physical Activity: Inactive (08/01/2021)   Exercise Vital Sign    Days of Exercise per Week: 0 days    Minutes of Exercise per Session: 0 min  Stress: No Stress Concern Present (08/01/2021)   [place]    Feeling of Stress : Not at all  Social Connections: Moderately Integrated (08/01/2021)   Social Connection and Isolation Panel [NHANES]    Frequency of Communication with Friends and Family: Twice a week    Frequency of Social Gatherings with Friends and Family: Once a week    Attends Religious Services: 1 to 4 times per year    Active Member of [employer] or Organizations: No    Attends Archivist Meetings: Never    Marital Status: Married   Outpatient Encounter Medications as of 04/09/2022  Medication Sig   aspirin EC 81 MG tablet Take 81 mg by mouth daily.   CVS D3 50 MCG (2000 UT) CAPS TAKE 1 CAPSULE (2,000 UNITS TOTAL) BY MOUTH DAILY WITH BREAKFAST.   fluticasone (FLONASE) 50 MCG/ACT nasal spray Place  2 sprays into the nose daily as needed for allergies.   gabapentin (NEURONTIN) 300 MG capsule Take 300 mg by mouth 3 (three) times daily.   levothyroxine (SYNTHROID) 88 MCG tablet Take 1 tablet (88 mcg total) by mouth daily before breakfast.   loratadine (CLARITIN) 10 MG tablet Take 10 mg by mouth daily as needed for allergies.   meloxicam (MOBIC) 15 MG tablet Take 15 mg by mouth daily.   metFORMIN (GLUCOPHAGE) 500 MG tablet Take 1 tablet (500 mg total) by mouth daily.   Olmesartan-amLODIPine-HCTZ 40-5-25 MG TABS TAKE (1) TABLET BY MOUTH DAILY - NEED FOLLOW UP FOR MORE REFILLS.   pantoprazole (PROTONIX) 40 MG tablet Take 40 mg by mouth daily.   triamcinolone cream (KENALOG) 0.5 % Apply 1 application  topically daily as needed (irritation).   [DISCONTINUED] levothyroxine  (SYNTHROID) 100 MCG tablet TAKE 1 TABLET BY MOUTH DAILY BEFORE BREAKFAST.   No facility-administered encounter medications on file as of 04/09/2022.   ALLERGIES: Allergies  Allergen Reactions   Sulfonamide Derivatives Hives   VACCINATION STATUS: Immunization History  Administered Date(s) Administered   Influenza,inj,Quad PF,6+ Mos 04/18/2018    HPI -66 year old female patient with medical history as above.       -She is being seen in follow-up for her history of papillary thyroid cancer, postsurgical hypothyroidism, hypocalcemia, prediabetes. - she underwent thyroidectomy for papillary thyroid cancer in February 2014,  and rTSH stimulated RAI thyroid remnant ablation on 09/05/2012.  her pathology revealed multifocal 1.2, and 0.3 cms follicular variant PTC.  Her surveillance neck ultrasound is negative for any residual thyroid tissue x2, last neck sonogram in 2017.  Second surveillance study with rTSH WBS showed a "focus of abnormal tracer accumulation in mid chest at the midline highly suspicious for metastatic disease potentially to a mediastinal lymph node". She was sent for CT of thorax/neck with contrast which she underwent on 03/18/2015. This was reported as unremarkable, might have been artifactual. -Subsequent surveillance study with rTSH stimulation showed absence of the previously noticed abnormal tracer accumulation in the mid chest.  - Her most recent whole-body scan with Thyrogen reveals no evidence of distant iodine avid metastasis in November 2018. -Her surveillance thyroid/neck ultrasound on June 24, 2018 was negative for any residual thyroid tissue or recurrence.    -Her most recent Thyrogen stimulated whole-body scan on September 21, 2019 was negative for evidence of tumor recurrence. -Her last thyroid/neck ultrasound was from February 2022 which was negative for thyroid remnant/recurrence.     She is currently on levothyroxine 100 mcg p.o. daily before breakfast.  She  reports compliance and consistency with her medication.  Her previsit thyroid function tests are consistent with slight over-replacement.     She denies dysphagia, SOB, nor voice change.  She has no new complaints today. -She continues to feel better.      -She denies any family hx of thyroid dysfunction nor cancer.  -She is on metformin 500 mg p.o. daily at breakfast.  She returns with continued improvement in her point-of-care A1c of 5.9%.   She is not monitoring blood glucose regularly.     Review of Systems Limited as above.  Objective:    BP 132/84   Pulse 76   Ht '5\' 7"'  (1.702 m)   Wt 214 lb 12.8 oz (97.4 kg)   BMI 33.64 kg/m   Wt Readings from Last 3 Encounters:  04/09/22 214 lb 12.8 oz (97.4 kg)  04/06/22 214 lb 8.1 oz (97.3 kg)  03/12/22 214  lb 6.4 oz (97.3 kg)    complete Blood Count (Most recent): Lab Results  Component Value Date   WBC 6.3 08/21/2012   HGB 10.0 (L) 08/21/2012   HCT 29.9 (L) 08/21/2012   MCV 81.7 08/21/2012   PLT 241 08/21/2012    Recent Results (from the past 2160 hour(s))  Basic Metabolic Panel (BMET)     Status: Abnormal   Collection Time: 04/02/22  9:45 AM  Result Value Ref Range   Sodium 139 135 - 145 mmol/L   Potassium 4.0 3.5 - 5.1 mmol/L   Chloride 106 98 - 111 mmol/L   CO2 29 22 - 32 mmol/L   Glucose, Bld 100 (H) 70 - 99 mg/dL    Comment: Glucose reference range applies only to samples taken after fasting for at least 8 hours.   BUN 24 (H) 8 - 23 mg/dL   Creatinine, Ser 0.85 0.44 - 1.00 mg/dL   Calcium 9.2 8.9 - 10.3 mg/dL   GFR, Estimated >60 >60 mL/min    Comment: (NOTE) Calculated using the CKD-EPI Creatinine Equation (2021)    Anion gap 4 (L) 5 - 15    Comment: Performed at [facility], [address], [place], [place] [address]  TSH     Status: None   Collection Time: 04/02/22  9:47 AM  Result Value Ref Range   TSH 1.749 0.350 - 4.500 uIU/mL    Comment: Performed by a 3rd Generation assay with a functional  sensitivity of <=0.01 uIU/mL. Performed at [facility], [address], [place], [place] [address]   T4, free     Status: Abnormal   Collection Time: 04/02/22  9:47 AM  Result Value Ref Range   Free T4 1.33 (H) 0.61 - 1.12 ng/dL    Comment: (NOTE) Biotin ingestion may interfere with free T4 tests. If the results are inconsistent with the TSH level, previous test results, or the clinical presentation, then consider biotin interference. If needed, order repeat testing after stopping biotin. Performed at [facility], [place] [address] [place], [place] [address]   HgB A1c     Status: None   Collection Time: 04/09/22 10:14 AM  Result Value Ref Range   Hemoglobin A1C     HbA1c POC (<> result, manual entry)     HbA1c, POC (prediabetic range)     HbA1c, POC (controlled diabetic range) 5.9 0.0 - 7.0 %    Calcium 10.5 above target on August 27, 2018   Thyroid/neck ultrasound on June 24, 2018 No discrete nodules are seen within the thyroidectomy bed.   IMPRESSION: No residual or recurrent tissue post total thyroidectomy.   Thyrogen stimulated whole-body scan on September 21, 2019 FINDINGS: No focal uptake in the thyroid bed. Mild uptake in the mediastinum is less than exam from 06/14/2017. Physiologic uptake noted in the salivary glands and GI tract as well as a GU tract.   IMPRESSION: No scintigraphic thyroid cancer recurrence.  August 19, 2020 thyroid/neck ultrasound IMPRESSION: Post total thyroidectomy without evidence of residual or locally recurrent disease.  Assessment & Plan:   1. Malignant neoplasm of thyroid gland (HCC)  - She has had multifocal FVPTC ( 1.2 and 0.3 cms), stage 1 , with no evidence of lymphovascular invasion. She is s/p total thyroidectomy February 2014. She is s/p rTSH stimulated RAI remnant ablation with post therapy scan which is negative for distant spread on 09/05/2012. Her stimulated TG level was 1.9 along with <20 Tg Abs.  her  surveillance  thyroid u/s is negative for any residual thyroid tissue x 1. Her last neck/ thyroid ultrasound is s/f surgically absent thyroid .  Second surveillance study with rTSH WBS on 02/18/2015 showed a "focus of abnormal tracer accumulation in mid chest at the midline highly suspicious for metastatic disease potentially to a mediastinal lymph node". - CT with contrast of her chest and neck were unremarkable, might have been artifactual.     Her  rTSH stimulated whole-body scan on 12/09/2015 is reported as unremarkable,  Specifically, focus of abnormal uptake seen within the mid chest on the previous exam is no longer identified.  -  thyroglobulin level is 0.1 improving from 1.9 ,  with antithyroglobulin antibodies at <0.1.  -  Her surveillance thyroid/neck ultrasound on 04/03/2016 was unremarkable,  showing surgically absent thyroid. - Her most recent Thyrogen stimulated whole-body scan from 06/14/2017 and thyroglobulin level before this visit where reassuring with no evidence of distant iodine avid metastasis. -Her previsit unstimulated thyroglobulin level is 0.1 along with thyroglobulin antibodies.  June 24, 2018 surveillance thyroid/neck ultrasound: No residual or recurrent tissue post total thyroidectomy.  -Her most recent Thyrogen stimulated whole-body scan on September 21, 2019 was negative for evidence of tumor recurrence.  Her last thyroid/neck ultrasound from February 2022 was consistent with total thyroidectomy without evidence of residual or locally recurrent disease.    She will be considered for repeat surveillance thyroid ultrasound next year.   2. Postsurgical hypothyroidism - Post surgical hypothyroidism on suppressive therapy: Her thyroid function tests are consistent with slight over-replacement.  I discussed and lowered her levothyroxine to 88 mcg p.o. daily before breakfast.    - We discussed about the correct intake of her thyroid hormone, on empty stomach at  fasting, with water, separated by at least 30 minutes from breakfast and other medications,  and separated by more than 4 hours from calcium, iron, multivitamins, acid reflux medications (PPIs). -Patient is made aware of the fact that thyroid hormone replacement is needed for life, dose to be adjusted by periodic monitoring of thyroid function tests.   3.  hypertension  -She is advised to be consistent on her blood pressure medications including Tribenzor 40-5-20 5 mg p.o. daily.    4. Pre-diabetes -Her point-of-care A1c is at 5.9%, overall improving from 6.2%.  She is tolerating her metformin, advised to continue metformin 500 mg p.o. daily at breakfast.      5) hypercalcemia : Resolved after she stopped her calcium supplements.  She is advised to continue vitamin D3 2000 units daily.    - I advised patient to maintain close follow up with Sharilyn Sites, MD for primary care needs.    I spent 31 minutes in the care of the patient today including review of labs from Thyroid Function, CMP, and other relevant labs ; imaging/biopsy records (current and previous including abstractions from other facilities); face-to-face time discussing  her lab results and symptoms, medications doses, her options of short and long term treatment based on the latest standards of care / guidelines;   and documenting the encounter.  Sophia Wilson  participated in the discussions, expressed understanding, and voiced agreement with the above plans.  All questions were answered to her satisfaction. she is encouraged to contact clinic should she have any questions or concerns prior to her return visit.   Follow up plan: Return in about 6 months (around 10/08/2022) for F/U with Pre-visit Labs, A1c -[place].  Glade Lloyd, MD Phone: [phone]  Fax: [phone]  -  This note was partially dictated with voice recognition software. Similar sounding words can be transcribed inadequately or may not  be corrected upon  review.  04/09/2022, 12:24 PM

## 2022-04-09 NOTE — Patient Instructions (Signed)

## 2022-04-10 ENCOUNTER — Ambulatory Visit (HOSPITAL_COMMUNITY): Payer: BC Managed Care – PPO | Admitting: Anesthesiology

## 2022-04-10 ENCOUNTER — Encounter (HOSPITAL_COMMUNITY): Payer: Self-pay

## 2022-04-10 ENCOUNTER — Ambulatory Visit (HOSPITAL_COMMUNITY)
Admission: RE | Admit: 2022-04-10 | Discharge: 2022-04-10 | Disposition: A | Discharge status: Home / Self Care | Payer: BC Managed Care – PPO | Attending: Internal Medicine | Admitting: Internal Medicine

## 2022-04-10 ENCOUNTER — Other Ambulatory Visit: Payer: Self-pay

## 2022-04-10 ENCOUNTER — Encounter (HOSPITAL_COMMUNITY)
Admission: RE | Disposition: A | Discharge status: Home / Self Care | Payer: Self-pay | Source: Home / Self Care | Attending: Internal Medicine

## 2022-04-10 DIAGNOSIS — E039 Hypothyroidism, unspecified: Secondary | ICD-10-CM | POA: Diagnosis not present

## 2022-04-10 DIAGNOSIS — E119 Type 2 diabetes mellitus without complications: Secondary | ICD-10-CM | POA: Insufficient documentation

## 2022-04-10 DIAGNOSIS — K449 Diaphragmatic hernia without obstruction or gangrene: Secondary | ICD-10-CM | POA: Diagnosis not present

## 2022-04-10 DIAGNOSIS — Z85850 Personal history of malignant neoplasm of thyroid: Secondary | ICD-10-CM | POA: Insufficient documentation

## 2022-04-10 DIAGNOSIS — Z79899 Other long term (current) drug therapy: Secondary | ICD-10-CM | POA: Insufficient documentation

## 2022-04-10 DIAGNOSIS — Z1211 Encounter for screening for malignant neoplasm of colon: Secondary | ICD-10-CM | POA: Diagnosis not present

## 2022-04-10 DIAGNOSIS — I10 Essential (primary) hypertension: Secondary | ICD-10-CM | POA: Insufficient documentation

## 2022-04-10 DIAGNOSIS — R1310 Dysphagia, unspecified: Secondary | ICD-10-CM | POA: Insufficient documentation

## 2022-04-10 DIAGNOSIS — K2970 Gastritis, unspecified, without bleeding: Secondary | ICD-10-CM | POA: Insufficient documentation

## 2022-04-10 DIAGNOSIS — Z791 Long term (current) use of non-steroidal anti-inflammatories (NSAID): Secondary | ICD-10-CM | POA: Diagnosis not present

## 2022-04-10 DIAGNOSIS — K2950 Unspecified chronic gastritis without bleeding: Secondary | ICD-10-CM | POA: Diagnosis not present

## 2022-04-10 DIAGNOSIS — Z1212 Encounter for screening for malignant neoplasm of rectum: Secondary | ICD-10-CM

## 2022-04-10 DIAGNOSIS — M1990 Unspecified osteoarthritis, unspecified site: Secondary | ICD-10-CM | POA: Insufficient documentation

## 2022-04-10 DIAGNOSIS — K648 Other hemorrhoids: Secondary | ICD-10-CM | POA: Diagnosis not present

## 2022-04-10 DIAGNOSIS — K219 Gastro-esophageal reflux disease without esophagitis: Secondary | ICD-10-CM | POA: Diagnosis not present

## 2022-04-10 HISTORY — PX: ESOPHAGOGASTRODUODENOSCOPY (EGD) WITH PROPOFOL: SHX5813

## 2022-04-10 HISTORY — PX: COLONOSCOPY WITH PROPOFOL: SHX5780

## 2022-04-10 HISTORY — PX: BIOPSY: SHX5522

## 2022-04-10 LAB — GLUCOSE, CAPILLARY
Glucose-Capillary: 79 mg/dL (ref 70–99)
Glucose-Capillary: 91 mg/dL (ref 70–99)

## 2022-04-10 SURGERY — COLONOSCOPY WITH PROPOFOL
Anesthesia: General

## 2022-04-10 MED ORDER — PHENYLEPHRINE HCL (PRESSORS) 10 MG/ML IV SOLN
INTRAVENOUS | Status: DC | PRN
  Administered 2022-04-10: 160 ug via INTRAVENOUS

## 2022-04-10 MED ORDER — LACTATED RINGERS IV SOLN: INTRAVENOUS | Status: DC

## 2022-04-10 MED ORDER — PROPOFOL 500 MG/50ML IV EMUL
INTRAVENOUS | Status: DC | PRN
  Administered 2022-04-10: 150 ug/kg/min via INTRAVENOUS

## 2022-04-10 MED ORDER — PROPOFOL 10 MG/ML IV BOLUS
INTRAVENOUS | Status: DC | PRN
  Administered 2022-04-10: 100 mg via INTRAVENOUS

## 2022-04-10 MED ORDER — PROPOFOL 500 MG/50ML IV EMUL
INTRAVENOUS | Status: AC
  Filled 2022-04-10: qty 100

## 2022-04-10 MED ORDER — LIDOCAINE HCL (CARDIAC) PF 100 MG/5ML IV SOSY
PREFILLED_SYRINGE | INTRAVENOUS | Status: DC | PRN
  Administered 2022-04-10: 50 mg via INTRAVENOUS

## 2022-04-10 MED ORDER — STERILE WATER FOR IRRIGATION IR SOLN
Status: DC | PRN
  Administered 2022-04-10: 60 mL

## 2022-04-10 NOTE — Op Note (Signed)
[facility] Patient Name: Sophia Wilson Procedure Date: 04/10/2022 9:15 AM MRN: [id number] Date of Birth: 11-Dec-1955 Attending MD: Elon Alas. Abbey Chatters DO CSN: [id number] Age: 66 Admit Type: Outpatient Procedure:                Upper GI endoscopy Indications:              Dysphagia Providers:                Elon Alas. Abbey Chatters, DO, Gwynneth Albright RN, RN,                            Aram Candela Referring MD:              Medicines:                See the Anesthesia note for documentation of the                            administered medications Complications:            No immediate complications. Estimated Blood Loss:     Estimated blood loss was minimal. Procedure:                Pre-Anesthesia Assessment:                           - The anesthesia plan was to use monitored                            anesthesia care (MAC).                           After obtaining informed consent, the endoscope was                            passed under direct vision. Throughout the                            procedure, the patient's blood pressure, pulse, and                            oxygen saturations were monitored continuously. The                            [id number]-[id number] ([id number]) scope was introduced through the                            mouth, and advanced to the second part of duodenum.                            The upper GI endoscopy was accomplished without                            difficulty. The patient tolerated the procedure                            well. Scope In: 9:26:39 AM Scope  Out: 9:29:39 AM Total Procedure Duration: 0 hours 3 minutes 0 seconds  Findings:      A small hiatal hernia was present.      There is no endoscopic evidence of areas of erosion, esophagitis,       stenosis or stricture in the entire esophagus.      Patchy moderate inflammation characterized by erosions and erythema was       found in the gastric body. Biopsies were taken with a cold  forceps for       Helicobacter pylori testing.      The duodenal bulb, first portion of the duodenum and second portion of       the duodenum were normal. Impression:               - Small hiatal hernia.                           - Gastritis. Biopsied.                           - Normal duodenal bulb, first portion of the                            duodenum and second portion of the duodenum. Moderate Sedation:      Per Anesthesia Care Recommendation:           - Patient has a contact number available for                            emergencies. The signs and symptoms of potential                            delayed complications were discussed with the                            patient. Return to normal activities tomorrow.                            Written discharge instructions were provided to the                            patient.                           - Resume previous diet.                           - Continue present medications.                           - Await pathology results.                           - Use Protonix (pantoprazole) 40 mg PO daily.                           - Consider MBSS                           -  Return to GI clinic in 3 months. Procedure Code(s):        --- Professional ---                           [phone], Esophagogastroduodenoscopy, flexible,                            transoral; with biopsy, single or multiple Diagnosis Code(s):        --- Professional ---                           K44.9, Diaphragmatic hernia without obstruction or                            gangrene                           K29.70, Gastritis, unspecified, without bleeding                           R13.10, Dysphagia, unspecified CPT copyright 2019 American Medical Association. All rights reserved. The codes documented in this report are preliminary and upon coder review may  be revised to meet current compliance requirements. Elon Alas. Abbey Chatters, DO [place] Abbey Chatters,  DO 04/10/2022 9:31:57 AM This report has been signed electronically. Number of Addenda: 0

## 2022-04-10 NOTE — Transfer of Care (Signed)
Immediate Anesthesia Transfer of Care Note  Patient: Sophia Wilson  Procedure(s) Performed: COLONOSCOPY WITH PROPOFOL ESOPHAGOGASTRODUODENOSCOPY (EGD) WITH PROPOFOL BIOPSY  Patient Location: PACU  Anesthesia Type:General  Level of Consciousness: awake, alert , oriented and patient cooperative  Airway & Oxygen Therapy: Patient Spontanous Breathing  Post-op Assessment: Report given to RN, Post -op Vital signs reviewed and stable and Patient moving all extremities X 4  Post vital signs: Reviewed and stable  Last Vitals:  Vitals Value Taken Time  BP 112/64   Temp    Pulse 88   Resp 16   SpO2 97     Last Pain:  Vitals:   04/10/22 0923  TempSrc:   PainSc: 0-No pain      Patients Stated Pain Goal: 6 (40/98/11 9147)  Complications: No notable events documented.

## 2022-04-10 NOTE — Interval H&P Note (Signed)
History and Physical Interval Note:  04/10/2022 8:49 AM  Sophia Wilson  has presented today for surgery, with the diagnosis of dysphagia,GERD,colon cancer screening.  The various methods of treatment have been discussed with the patient and family. After consideration of risks, benefits and other options for treatment, the patient has consented to  Procedure(s) with comments: COLONOSCOPY WITH PROPOFOL (N/A) - 9:15 am  ASA 2 ESOPHAGOGASTRODUODENOSCOPY (EGD) WITH PROPOFOL (N/A) BALLOON DILATION (N/A) as a surgical intervention.  The patient's history has been reviewed, patient examined, no change in status, stable for surgery.  I have reviewed the patient's chart and labs.  Questions were answered to the patient's satisfaction.     Eloise Harman

## 2022-04-10 NOTE — Discharge Instructions (Signed)
EGD Discharge instructions Please read the instructions outlined below and refer to this sheet in the next few weeks. These discharge instructions provide you with general information on caring for yourself after you leave the hospital. Your doctor may also give you specific instructions. While your treatment has been planned according to the most current medical practices available, unavoidable complications occasionally occur. If you have any problems or questions after discharge, please call your doctor. ACTIVITY You may resume your regular activity but move at a slower pace for the next 24 hours.  Take frequent rest periods for the next 24 hours.  Walking will help expel (get rid of) the air and reduce the bloated feeling in your abdomen.  No driving for 24 hours (because of the anesthesia (medicine) used during the test).  You may shower.  Do not sign any important legal documents or operate any machinery for 24 hours (because of the anesthesia used during the test).  NUTRITION Drink plenty of fluids.  You may resume your normal diet.  Begin with a light meal and progress to your normal diet.  Avoid alcoholic beverages for 24 hours or as instructed by your caregiver.  MEDICATIONS You may resume your normal medications unless your caregiver tells you otherwise.  WHAT YOU CAN EXPECT TODAY You may experience abdominal discomfort such as a feeling of fullness or "gas" pains.  FOLLOW-UP Your doctor will discuss the results of your test with you.  SEEK IMMEDIATE MEDICAL ATTENTION IF ANY OF THE FOLLOWING OCCUR: Excessive nausea (feeling sick to your stomach) and/or vomiting.  Severe abdominal pain and distention (swelling).  Trouble swallowing.  Temperature over 101 F (37.8 C).  Rectal bleeding or vomiting of blood.     Colonoscopy Discharge Instructions  Read the instructions outlined below and refer to this sheet in the next few weeks. These discharge instructions provide you with  general information on caring for yourself after you leave the hospital. Your doctor may also give you specific instructions. While your treatment has been planned according to the most current medical practices available, unavoidable complications occasionally occur.   ACTIVITY You may resume your regular activity, but move at a slower pace for the next 24 hours.  Take frequent rest periods for the next 24 hours.  Walking will help get rid of the air and reduce the bloated feeling in your belly (abdomen).  No driving for 24 hours (because of the medicine (anesthesia) used during the test).   Do not sign any important legal documents or operate any machinery for 24 hours (because of the anesthesia used during the test).  NUTRITION Drink plenty of fluids.  You may resume your normal diet as instructed by your doctor.  Begin with a light meal and progress to your normal diet. Heavy or fried foods are harder to digest and may make you feel sick to your stomach (nauseated).  Avoid alcoholic beverages for 24 hours or as instructed.  MEDICATIONS You may resume your normal medications unless your doctor tells you otherwise.  WHAT YOU CAN EXPECT TODAY Some feelings of bloating in the abdomen.  Passage of more gas than usual.  Spotting of blood in your stool or on the toilet paper.  IF YOU HAD POLYPS REMOVED DURING THE COLONOSCOPY: No aspirin products for 7 days or as instructed.  No alcohol for 7 days or as instructed.  Eat a soft diet for the next 24 hours.  FINDING OUT THE RESULTS OF YOUR TEST Not all test results are  available during your visit. If your test results are not back during the visit, make an appointment with your caregiver to find out the results. Do not assume everything is normal if you have not heard from your caregiver or the medical facility. It is important for you to follow up on all of your test results.  SEEK IMMEDIATE MEDICAL ATTENTION IF: You have more than a spotting of  blood in your stool.  Your belly is swollen (abdominal distention).  You are nauseated or vomiting.  You have a temperature over 101.  You have abdominal pain or discomfort that is severe or gets worse throughout the day.   Your EGD revealed mild amount inflammation in your stomach.  I took biopsies of this to rule out infection with a bacteria called H. pylori.  Await pathology results, my office will contact you.  Your esophagus was normal today.  I elected not to dilate.  Continue on pantoprazole daily.  We may consider a study called Modified barium swallow study to further evaluate your difficulty with swallowing.  Your colonoscopy was relatively unremarkable.  I did not find any polyps or evidence of colon cancer.  I recommend repeating colonoscopy in 10 years for colon cancer screening purposes.    Follow up with GI in 3 months.    I hope you have a great rest of your week!  Elon Alas. Abbey Chatters, D.O. Gastroenterology and Hepatology [facility]

## 2022-04-10 NOTE — Anesthesia Preprocedure Evaluation (Signed)
Anesthesia Evaluation  Patient identified by MRN, date of birth, ID band Patient awake    Reviewed: Allergy & Precautions, H&P , NPO status , Patient's Chart, lab work & pertinent test results, reviewed documented beta blocker date and time   Airway Mallampati: II  TM Distance: >3 FB Neck ROM: full    Dental no notable dental hx.    Pulmonary neg pulmonary ROS,    Pulmonary exam normal breath sounds clear to auscultation       Cardiovascular Exercise Tolerance: Good hypertension, negative cardio ROS   Rhythm:regular Rate:Normal     Neuro/Psych negative neurological ROS  negative psych ROS   GI/Hepatic Neg liver ROS, GERD  Medicated,  Endo/Other  diabetes, Type 2Hypothyroidism   Renal/GU negative Renal ROS  negative genitourinary   Musculoskeletal   Abdominal   Peds  Hematology  (+) Blood dyscrasia, anemia ,   Anesthesia Other Findings   Reproductive/Obstetrics negative OB ROS                             Anesthesia Physical Anesthesia Plan  ASA: 2  Anesthesia Plan: General   Post-op Pain Management:    Induction:   PONV Risk Score and Plan: Propofol infusion  Airway Management Planned:   Additional Equipment:   Intra-op Plan:   Post-operative Plan:   Informed Consent: I have reviewed the patients History and Physical, chart, labs and discussed the procedure including the risks, benefits and alternatives for the proposed anesthesia with the patient or authorized representative who has indicated his/her understanding and acceptance.     Dental Advisory Given  Plan Discussed with: CRNA  Anesthesia Plan Comments:         Anesthesia Quick Evaluation

## 2022-04-10 NOTE — Op Note (Signed)
[facility] Patient Name: Sophia Wilson Procedure Date: 04/10/2022 9:16 AM MRN: [id number] Date of Birth: [birth year] Attending MD: Elon Alas. Abbey Chatters DO CSN: [id number] Age: 65 Admit Type: Outpatient Procedure:                Colonoscopy Indications:              Screening for colorectal malignant neoplasm Providers:                Elon Alas. Abbey Chatters, DO, Gwynneth Albright RN, RN,                            Everardo Pacific Referring MD:              Medicines:                See the Anesthesia note for documentation of the                            administered medications Complications:            No immediate complications. Estimated Blood Loss:     Estimated blood loss: none. Procedure:                Pre-Anesthesia Assessment:                           - The anesthesia plan was to use monitored                            anesthesia care (MAC).                           After obtaining informed consent, the colonoscope                            was passed under direct vision. Throughout the                            procedure, the patient's blood pressure, pulse, and                            oxygen saturations were monitored continuously. The                            [id number]-HQ190L ([id number]) scope was introduced through                            the anus and advanced to the the cecum, identified                            by appendiceal orifice and ileocecal valve. The                            colonoscopy was performed without difficulty. The                            patient tolerated the procedure well. The  quality                            of the bowel preparation was evaluated using the                            BBPS [facility] Bowel Preparation Scale) with scores                            of: Right Colon = 3, Transverse Colon = 3 and Left                            Colon = 3 (entire mucosa seen well with no residual                            staining, small  fragments of stool or opaque                            liquid). The total BBPS score equals 9. Scope In: 9:33:58 AM Scope Out: 9:45:37 AM Scope Withdrawal Time: 0 hours 8 minutes 10 seconds  Total Procedure Duration: 0 hours 11 minutes 39 seconds  Findings:      The perianal and digital rectal examinations were normal.      Non-bleeding internal hemorrhoids were found during endoscopy.      The exam was otherwise without abnormality. Impression:               - Non-bleeding internal hemorrhoids.                           - The examination was otherwise normal.                           - No specimens collected. Moderate Sedation:      Per Anesthesia Care Recommendation:           - Patient has a contact number available for                            emergencies. The signs and symptoms of potential                            delayed complications were discussed with the                            patient. Return to normal activities tomorrow.                            Written discharge instructions were provided to the                            patient.                           - Resume previous diet.                           -  Continue present medications.                           - Repeat colonoscopy in 10 years for screening                            purposes. Procedure Code(s):        --- Professional ---                           F5436, Colorectal cancer screening; colonoscopy on                            individual not meeting criteria for high risk Diagnosis Code(s):        --- Professional ---                           Z12.11, Encounter for screening for malignant                            neoplasm of colon                           K64.8, Other hemorrhoids CPT copyright 2019 American Medical Association. All rights reserved. The codes documented in this report are preliminary and upon coder review may  be revised to meet current compliance requirements. Elon Alas.  Abbey Chatters, DO [place] Abbey Chatters, DO 04/10/2022 9:46:58 AM This report has been signed electronically. Number of Addenda: 0

## 2022-04-12 LAB — SURGICAL PATHOLOGY

## 2022-04-12 NOTE — Anesthesia Postprocedure Evaluation (Signed)
Anesthesia Post Note  Patient: Sophia Wilson  Procedure(s) Performed: COLONOSCOPY WITH PROPOFOL ESOPHAGOGASTRODUODENOSCOPY (EGD) WITH PROPOFOL BIOPSY  Patient location during evaluation: Phase II Anesthesia Type: General Level of consciousness: awake Pain management: pain level controlled Vital Signs Assessment: post-procedure vital signs reviewed and stable Respiratory status: spontaneous breathing and respiratory function stable Cardiovascular status: blood pressure returned to baseline and stable Postop Assessment: no headache and no apparent nausea or vomiting Anesthetic complications: no Comments: Late entry   No notable events documented.   Last Vitals:  Vitals:   04/10/22 0951 04/10/22 0958  BP: 111/64 112/74  Pulse: 78   Resp: 17   Temp:    SpO2: 100%     Last Pain:  Vitals:   04/11/22 1504  TempSrc:   PainSc: 0-No pain                 Louann Sjogren

## 2022-04-17 ENCOUNTER — Encounter (HOSPITAL_COMMUNITY): Payer: Self-pay | Admitting: Internal Medicine

## 2022-05-29 ENCOUNTER — Other Ambulatory Visit (HOSPITAL_COMMUNITY): Payer: Self-pay | Admitting: Family Medicine

## 2022-05-29 DIAGNOSIS — Z1231 Encounter for screening mammogram for malignant neoplasm of breast: Secondary | ICD-10-CM

## 2022-06-29 ENCOUNTER — Ambulatory Visit (HOSPITAL_COMMUNITY)
Admission: RE | Admit: 2022-06-29 | Discharge: 2022-06-29 | Disposition: A | Discharge status: Home / Self Care | Payer: BC Managed Care – PPO | Source: Ambulatory Visit | Attending: Family Medicine | Admitting: Family Medicine

## 2022-06-29 DIAGNOSIS — Z1231 Encounter for screening mammogram for malignant neoplasm of breast: Secondary | ICD-10-CM | POA: Diagnosis not present

## 2022-07-02 ENCOUNTER — Other Ambulatory Visit: Payer: Self-pay | Admitting: "Endocrinology

## 2022-07-11 ENCOUNTER — Ambulatory Visit: Payer: BC Managed Care – PPO | Admitting: Gastroenterology

## 2022-07-27 DIAGNOSIS — D649 Anemia, unspecified: Secondary | ICD-10-CM | POA: Diagnosis not present

## 2022-07-27 DIAGNOSIS — E039 Hypothyroidism, unspecified: Secondary | ICD-10-CM | POA: Diagnosis not present

## 2022-07-27 DIAGNOSIS — E782 Mixed hyperlipidemia: Secondary | ICD-10-CM | POA: Diagnosis not present

## 2022-07-27 DIAGNOSIS — R609 Edema, unspecified: Secondary | ICD-10-CM | POA: Diagnosis not present

## 2022-07-27 DIAGNOSIS — E538 Deficiency of other specified B group vitamins: Secondary | ICD-10-CM | POA: Diagnosis not present

## 2022-07-27 DIAGNOSIS — Z6833 Body mass index (BMI) 33.0-33.9, adult: Secondary | ICD-10-CM | POA: Diagnosis not present

## 2022-07-27 DIAGNOSIS — E7849 Other hyperlipidemia: Secondary | ICD-10-CM | POA: Diagnosis not present

## 2022-07-27 DIAGNOSIS — I10 Essential (primary) hypertension: Secondary | ICD-10-CM | POA: Diagnosis not present

## 2022-07-27 DIAGNOSIS — E6609 Other obesity due to excess calories: Secondary | ICD-10-CM | POA: Diagnosis not present

## 2022-07-27 DIAGNOSIS — E1140 Type 2 diabetes mellitus with diabetic neuropathy, unspecified: Secondary | ICD-10-CM | POA: Diagnosis not present

## 2022-07-31 ENCOUNTER — Ambulatory Visit (INDEPENDENT_AMBULATORY_CARE_PROVIDER_SITE_OTHER): Payer: BC Managed Care – PPO | Admitting: Gastroenterology

## 2022-07-31 ENCOUNTER — Encounter: Payer: Self-pay | Admitting: Gastroenterology

## 2022-07-31 VITALS — BP 133/80 | HR 66 | Temp 97.7°F | Ht 66.0 in | Wt 219.4 lb

## 2022-07-31 DIAGNOSIS — R09A2 Foreign body sensation, throat: Secondary | ICD-10-CM | POA: Diagnosis not present

## 2022-07-31 NOTE — Patient Instructions (Signed)
I am referring you to an Ear, Nose, and Throat specialist.   Continue pantoprazole daily, 30 minutes before breakfast. It is best absorbed on an empty stomach.  We will see you in 6 months!  It was a pleasure to see you today. I want to create trusting relationships with patients to provide genuine, compassionate, and quality care. I value your feedback. If you receive a survey regarding your visit,  I greatly appreciate you taking time to fill this out.   Annitta Needs, PhD, ANP-BC [facility]

## 2022-07-31 NOTE — Progress Notes (Signed)
Gastroenterology Office Note     Primary Care Physician:  Sharilyn Sites, MD  Primary Gastroenterologist: Dr. Abbey Chatters    Chief Complaint   Chief Complaint  Patient presents with   Follow-up    Follow up on procedures and GERD. Pt states swallowing the same     History of Present Illness   Sophia Wilson is a 67 y.o. female presenting today in follow-up with a history of dysphagia, s/p EGD in interim from last visit. Colonoscopy also completed Sept 2023.   EGD with gastritis (negative H.pylori), normal duodenum. No evidence of stricture. Colonoscopy with internal hemorrhoids.   Has noted improvement on pantoprazole with regurgitation and GERD. No solid food dysphagia or pill dysphagia on PPI. Will have globus sensation intermittently. Feels like having issues with sinuses and post-nasal drainage. No odynophagia. Has to clear throat at times.   Past Medical History:  Diagnosis Date   Anemia    Cancer ([place])    thyroid cancer   Collagen vascular disease ([place])    Diabetes mellitus without complication ([place])    Eczema    Fibroid tumor    GERD (gastroesophageal reflux disease)    H/O seasonal allergies    Herpes simplex without mention of complication    rt thigh   Hypertension    Thyroid disease    cancer   Vitiligo     Past Surgical History:  Procedure Laterality Date   ABDOMINAL HYSTERECTOMY     BIOPSY  04/10/2022   Procedure: BIOPSY;  Surgeon: Eloise Harman, DO;  [facility];  Service: Endoscopy;;   COLONOSCOPY  03/10/2012   Surgeon: Danie Binder, MD;  moderate size internal hemorrhoids, otherwise normal exam with recommendations to repeat in 10 years.   COLONOSCOPY WITH PROPOFOL N/A 04/10/2022   internal hemorrhoids.   ESOPHAGOGASTRODUODENOSCOPY (EGD) WITH PROPOFOL N/A 04/10/2022   gastritis (negative H.pylori), normal duodenum.   THYROIDECTOMY  08/20/2012   Procedure: THYROIDECTOMY;  Surgeon: Jamesetta So, MD;  Location: [facility];   Service: General;  Laterality: N/A;  Total Thyroidectomy    Current Outpatient Medications  Medication Sig Dispense Refill   aspirin EC 81 MG tablet Take 81 mg by mouth daily.     [employer] D3 50 MCG (2000 UT) CAPS TAKE 1 CAPSULE (2,000 UNITS TOTAL) BY MOUTH DAILY WITH BREAKFAST. 100 capsule 2   fluticasone (FLONASE) 50 MCG/ACT nasal spray Place 2 sprays into the nose daily as needed for allergies.     gabapentin (NEURONTIN) 300 MG capsule Take 300 mg by mouth 3 (three) times daily.     levothyroxine (SYNTHROID) 88 MCG tablet Take 1 tablet (88 mcg total) by mouth daily before breakfast. 90 tablet 1   loratadine (CLARITIN) 10 MG tablet Take 10 mg by mouth daily as needed for allergies.     meloxicam (MOBIC) 15 MG tablet Take 15 mg by mouth daily.     metFORMIN (GLUCOPHAGE) 500 MG tablet TAKE ONE TABLET BY MOUTH DAILY. 90 tablet 0   Olmesartan-amLODIPine-HCTZ 40-5-25 MG TABS TAKE (1) TABLET BY MOUTH DAILY - NEED FOLLOW UP FOR MORE REFILLS. 90 tablet 0   pantoprazole (PROTONIX) 40 MG tablet Take 40 mg by mouth daily.     triamcinolone cream (KENALOG) 0.5 % Apply 1 application  topically daily as needed (irritation).     No current facility-administered medications for this visit.    Allergies as of 07/31/2022 - Review Complete 07/31/2022  Allergen Reaction Noted   Sulfonamide  derivatives Hives 02/25/2012    Family History  Problem Relation Age of Onset   Cancer Mother        lung    Other Daughter        blood clot on lung   Colon cancer Neg Hx    Esophageal cancer Neg Hx     Social History   Socioeconomic History   Marital status: Married    Spouse name: Not on file   Number of children: 1   Years of education: Not on file   Highest education level: Not on file  Occupational History   Not on file  Tobacco Use   Smoking status: Never   Smokeless tobacco: Never  Vaping Use   Vaping Use: Never used  Substance and Sexual Activity   Alcohol use: No   Drug use: No   Sexual  activity: Yes    Birth control/protection: Surgical    Comment: hysterectomy  Other Topics Concern   Not on file  Social History Narrative   Not on file   Social Determinants of Health   Financial Resource Strain: Low Risk  (08/01/2021)   Overall Financial Resource Strain (CARDIA)    Difficulty of Paying Living Expenses: Not very hard  Food Insecurity: No Food Insecurity (08/01/2021)   Hunger Vital Sign    Worried About Running Out of Food in the Last Year: Never true    Ran Out of Food in the Last Year: Never true  Transportation Needs: No Transportation Needs (08/01/2021)   PRAPARE - Hydrologist (Medical): No    Lack of Transportation (Non-Medical): No  Physical Activity: Inactive (08/01/2021)   Exercise Vital Sign    Days of Exercise per Week: 0 days    Minutes of Exercise per Session: 0 min  Stress: No Stress Concern Present (08/01/2021)   [place]    Feeling of Stress : Not at all  Social Connections: Moderately Integrated (08/01/2021)   Social Connection and Isolation Panel [NHANES]    Frequency of Communication with Friends and Family: Twice a week    Frequency of Social Gatherings with Friends and Family: Once a week    Attends Religious Services: 1 to 4 times per year    Active Member of [employer] or Organizations: No    Attends Archivist Meetings: Never    Marital Status: Married  Human resources officer Violence: Not At Risk (08/01/2021)   Humiliation, Afraid, Rape, and Kick questionnaire    Fear of Current or Ex-Partner: No    Emotionally Abused: No    Physically Abused: No    Sexually Abused: No     Review of Systems   Gen: Denies any fever, chills, fatigue, weight loss, lack of appetite.  CV: Denies chest pain, heart palpitations, peripheral edema, syncope.  Resp: Denies shortness of breath at rest or with exertion. Denies wheezing or cough.  GI: Denies dysphagia  or odynophagia. Denies jaundice, hematemesis, fecal incontinence. GU : Denies urinary burning, urinary frequency, urinary hesitancy MS: Denies joint pain, muscle weakness, cramps, or limitation of movement.  Derm: Denies rash, itching, dry skin Psych: Denies depression, anxiety, memory loss, and confusion Heme: Denies bruising, bleeding, and enlarged lymph nodes.   Physical Exam   BP 133/80   Pulse 66   Temp 97.7 F (36.5 C)   Ht '5\' 6"'$  (1.676 m)   Wt 219 lb 6.4 oz (99.5 kg)   BMI 35.41 kg/m  General:   Alert and oriented. Pleasant and cooperative. Well-nourished and well-developed.  Head/Neck:  Normocephalic and atraumatic. No lymphadenopathy  Eyes:  Without icterus Abdomen:  +BS, soft, non-tender and non-distended. No HSM noted. No guarding or rebound. No masses appreciated.  Rectal:  Deferred  Msk:  Symmetrical without gross deformities. Normal posture. Extremities:  Without edema. Neurologic:  Alert and  oriented x4;  grossly normal neurologically. Skin:  Intact without significant lesions or rashes. Psych:  Alert and cooperative. Normal mood and affect.   Assessment   Sophia Wilson is a 67 y.o. female presenting today in follow-up after EGD completed for dysphagia.  Dysphagia improved on PPI daily. GERD controlled without breakthrough symptoms. She notes intermittent globus sensation that has not improved. Query if LPR, post-nasal drainage. Will refer to ENT for further evaluation. As no dysphagia, will hold off on Speech eval or BPE. EGD without stricture.    PLAN    Continue PPI daily ENT referral 6 month follow-up   Annitta Needs, PhD, ANP-BC [facility]

## 2022-10-01 DIAGNOSIS — C73 Malignant neoplasm of thyroid gland: Secondary | ICD-10-CM | POA: Diagnosis not present

## 2022-10-01 DIAGNOSIS — E890 Postprocedural hypothyroidism: Secondary | ICD-10-CM | POA: Diagnosis not present

## 2022-10-07 LAB — LIPID PANEL
Chol/HDL Ratio: 2.5 ratio (ref 0.0–4.4)
Cholesterol, Total: 197 mg/dL (ref 100–199)
HDL: 78 mg/dL (ref >39)
LDL Chol Calc (NIH): 106 mg/dL — ABNORMAL HIGH (ref 0–99)
Triglycerides: 72 mg/dL (ref 0–149)
VLDL Cholesterol Cal: 13 mg/dL (ref 5–40)

## 2022-10-07 LAB — COMPREHENSIVE METABOLIC PANEL
ALT: 9 IU/L (ref 0–32)
AST: 15 IU/L (ref 0–40)
Albumin/Globulin Ratio: 1.4 (ref 1.2–2.2)
Albumin: 4 g/dL (ref 3.9–4.9)
Alkaline Phosphatase: 79 IU/L (ref 44–121)
BUN/Creatinine Ratio: 21 (ref 12–28)
BUN: 22 mg/dL (ref 8–27)
Bilirubin Total: 0.3 mg/dL (ref 0.0–1.2)
CO2: 25 mmol/L (ref 20–29)
Calcium: 9.6 mg/dL (ref 8.7–10.3)
Chloride: 102 mmol/L (ref 96–106)
Creatinine, Ser: 1.04 mg/dL — ABNORMAL HIGH (ref 0.57–1.00)
Globulin, Total: 2.9 g/dL (ref 1.5–4.5)
Glucose: 89 mg/dL (ref 70–99)
Potassium: 4.2 mmol/L (ref 3.5–5.2)
Sodium: 141 mmol/L (ref 134–144)
Total Protein: 6.9 g/dL (ref 6.0–8.5)
eGFR: 59 mL/min/{1.73_m2} — ABNORMAL LOW (ref >59)

## 2022-10-07 LAB — T4, FREE: Free T4: 1.45 ng/dL (ref 0.82–1.77)

## 2022-10-07 LAB — THYROGLOBULIN LEVEL: Thyroglobulin (TG-RIA): <2 ng/mL

## 2022-10-07 LAB — TSH: TSH: 22.4 u[IU]/mL — ABNORMAL HIGH (ref 0.450–4.500)

## 2022-10-08 ENCOUNTER — Encounter: Payer: Self-pay | Admitting: "Endocrinology

## 2022-10-08 ENCOUNTER — Ambulatory Visit: Payer: BC Managed Care – PPO | Admitting: "Endocrinology

## 2022-10-08 ENCOUNTER — Other Ambulatory Visit: Payer: Self-pay | Admitting: "Endocrinology

## 2022-10-08 VITALS — BP 128/80 | HR 72 | Ht 66.0 in | Wt 217.8 lb

## 2022-10-08 DIAGNOSIS — C73 Malignant neoplasm of thyroid gland: Secondary | ICD-10-CM

## 2022-10-08 DIAGNOSIS — E890 Postprocedural hypothyroidism: Secondary | ICD-10-CM | POA: Diagnosis not present

## 2022-10-08 DIAGNOSIS — R7303 Prediabetes: Secondary | ICD-10-CM | POA: Diagnosis not present

## 2022-10-08 DIAGNOSIS — E559 Vitamin D deficiency, unspecified: Secondary | ICD-10-CM | POA: Diagnosis not present

## 2022-10-08 LAB — POCT GLYCOSYLATED HEMOGLOBIN (HGB A1C): HbA1c, POC (controlled diabetic range): 5.8 % (ref 0.0–7.0)

## 2022-10-08 MED ORDER — LEVOTHYROXINE SODIUM 100 MCG PO TABS: 100.0000 ug | ORAL_TABLET | Freq: Every day | ORAL | 1 refills | Status: DC

## 2022-10-08 NOTE — Progress Notes (Signed)
10/08/2022       Endocrinology follow-up note  Subjective:    Patient ID: Sophia Wilson, female    DOB: Nov 08, 1955, PCP Sharilyn Sites, MD   Past Medical History:  Diagnosis Date   Anemia    Cancer [facility])    thyroid cancer   Collagen vascular disease ([place])    Diabetes mellitus without complication ([place])    Eczema    Fibroid tumor    GERD (gastroesophageal reflux disease)    H/O seasonal allergies    Herpes simplex without mention of complication    rt thigh   Hypertension    Thyroid disease    cancer   Vitiligo    Past Surgical History:  Procedure Laterality Date   ABDOMINAL HYSTERECTOMY     BIOPSY  04/10/2022   Procedure: BIOPSY;  Surgeon: Eloise Harman, DO;  [facility];  Service: Endoscopy;;   COLONOSCOPY  03/10/2012   Surgeon: Danie Binder, MD;  moderate size internal hemorrhoids, otherwise normal exam with recommendations to repeat in 10 years.   COLONOSCOPY WITH PROPOFOL N/A 04/10/2022   internal hemorrhoids.   ESOPHAGOGASTRODUODENOSCOPY (EGD) WITH PROPOFOL N/A 04/10/2022   gastritis (negative H.pylori), normal duodenum.   THYROIDECTOMY  08/20/2012   Procedure: THYROIDECTOMY;  Surgeon: Jamesetta So, MD;  Location: [facility];  Service: General;  Laterality: N/A;  Total Thyroidectomy   Social History   Socioeconomic History   Marital status: Married    Spouse name: Not on file   Number of children: 1   Years of education: Not on file   Highest education level: Not on file  Occupational History   Not on file  Tobacco Use   Smoking status: Never   Smokeless tobacco: Never  Vaping Use   Vaping Use: Never used  Substance and Sexual Activity   Alcohol use: No   Drug use: No   Sexual activity: Yes    Birth control/protection: Surgical    Comment: hysterectomy  Other Topics Concern   Not on file  Social History Narrative   Not on file   Social Determinants of Health   Financial Resource Strain: Low Risk  (08/01/2021)   Overall  Financial Resource Strain (CARDIA)    Difficulty of Paying Living Expenses: Not very hard  Food Insecurity: No Food Insecurity (08/01/2021)   Hunger Vital Sign    Worried About Running Out of Food in the Last Year: Never true    Ran Out of Food in the Last Year: Never true  Transportation Needs: No Transportation Needs (08/01/2021)   PRAPARE - Hydrologist (Medical): No    Lack of Transportation (Non-Medical): No  Physical Activity: Inactive (08/01/2021)   Exercise Vital Sign    Days of Exercise per Week: 0 days    Minutes of Exercise per Session: 0 min  Stress: No Stress Concern Present (08/01/2021)   [place]    Feeling of Stress : Not at all  Social Connections: Moderately Integrated (08/01/2021)   Social Connection and Isolation Panel [NHANES]    Frequency of Communication with Friends and Family: Twice a week    Frequency of Social Gatherings with Friends and Family: Once a week    Attends Religious Services: 1 to 4 times per year    Active Member of [employer] or Organizations: No    Attends Archivist Meetings: Never    Marital Status: Married   Outpatient Encounter  Medications as of 10/08/2022  Medication Sig   aspirin EC 81 MG tablet Take 81 mg by mouth daily.   [employer]S D3 50 MCG (2000 UT) CAPS TAKE 1 CAPSULE (2,000 UNITS TOTAL) BY MOUTH DAILY WITH BREAKFAST.   fluticasone (FLONASE) 50 MCG/ACT nasal spray Place 2 sprays into the nose daily as needed for allergies.   gabapentin (NEURONTIN) 300 MG capsule Take 300 mg by mouth 3 (three) times daily.   levothyroxine (SYNTHROID) 100 MCG tablet Take 1 tablet (100 mcg total) by mouth daily before breakfast.   loratadine (CLARITIN) 10 MG tablet Take 10 mg by mouth daily as needed for allergies.   meloxicam (MOBIC) 15 MG tablet Take 15 mg by mouth daily.   metFORMIN (GLUCOPHAGE) 500 MG tablet TAKE ONE TABLET BY MOUTH DAILY.    Olmesartan-amLODIPine-HCTZ 40-5-25 MG TABS TAKE (1) TABLET BY MOUTH DAILY - NEED FOLLOW UP FOR MORE REFILLS.   pantoprazole (PROTONIX) 40 MG tablet Take 40 mg by mouth daily.   triamcinolone cream (KENALOG) 0.5 % Apply 1 application  topically daily as needed (irritation).   [DISCONTINUED] levothyroxine (SYNTHROID) 88 MCG tablet Take 1 tablet (88 mcg total) by mouth daily before breakfast.   No facility-administered encounter medications on file as of 10/08/2022.   ALLERGIES: Allergies  Allergen Reactions   Sulfonamide Derivatives Hives   VACCINATION STATUS: Immunization History  Administered Date(s) Administered   Influenza,inj,Quad PF,6+ Mos 04/18/2018    HPI -67 year old female patient with medical history as above.       -She is being seen in follow-up for her history of papillary thyroid cancer, postsurgical hypothyroidism, hypocalcemia, prediabetes. - she underwent thyroidectomy for papillary thyroid cancer in February 2014,  and rTSH stimulated RAI thyroid remnant ablation on 09/05/2012.  her pathology revealed multifocal 1.2, and 0.3 cms follicular variant PTC.  Her surveillance neck ultrasound is negative for any residual thyroid tissue x2, last neck sonogram in 2017.  Second surveillance study with rTSH WBS showed a "focus of abnormal tracer accumulation in mid chest at the midline highly suspicious for metastatic disease potentially to a mediastinal lymph node". She was sent for CT of thorax/neck with contrast which she underwent on 03/18/2015. This was reported as unremarkable, might have been artifactual. -Subsequent surveillance study with rTSH stimulation showed absence of the previously noticed abnormal tracer accumulation in the mid chest.  - Her most recent whole-body scan with Thyrogen reveals no evidence of distant iodine avid metastasis in November 2018. -Her surveillance thyroid/neck ultrasound on June 24, 2018 was negative for any residual thyroid tissue or  recurrence.    -Her most recent Thyrogen stimulated whole-body scan on September 21, 2019 was negative for evidence of tumor recurrence. -Her last thyroid/neck ultrasound was from February 2022 which was negative for thyroid remnant/recurrence.     She is currently on levothyroxine 88 mcg p.o. daily before breakfast.  She reports compliance and consistency with her medication.  Her previsit thyroid function tests are consistent with under replacement.   She denies dysphagia, SOB, nor voice change.  She has no new complaints today. -She continues to feel better.      -She denies any family hx of thyroid dysfunction nor cancer.  -She is on metformin 500 mg p.o. daily at breakfast.  She returns with continued improvement in her point-of-care A1c of 5.9%.   She is not monitoring blood glucose regularly.     Review of Systems Limited as above.  Objective:    BP 128/80   Pulse 72  Ht 5\' 6"  (1.676 m)   Wt 217 lb 12.8 oz (98.8 kg)   BMI 35.15 kg/m   Wt Readings from Last 3 Encounters:  10/08/22 217 lb 12.8 oz (98.8 kg)  07/31/22 219 lb 6.4 oz (99.5 kg)  04/10/22 214 lb (97.1 kg)    complete Blood Count (Most recent): Lab Results  Component Value Date   WBC 6.3 08/21/2012   HGB 10.0 (L) 08/21/2012   HCT 29.9 (L) 08/21/2012   MCV 81.7 08/21/2012   PLT 241 08/21/2012    Recent Results (from the past 2160 hour(s))  Comprehensive metabolic panel     Status: Abnormal   Collection Time: 10/01/22 10:10 AM  Result Value Ref Range   Glucose 89 70 - 99 mg/dL   BUN 22 8 - 27 mg/dL   Creatinine, Ser 1.04 (H) 0.57 - 1.00 mg/dL   eGFR 59 (L) >59 mL/min/1.73   BUN/Creatinine Ratio 21 12 - 28   Sodium 141 134 - 144 mmol/L   Potassium 4.2 3.5 - 5.2 mmol/L   Chloride 102 96 - 106 mmol/L   CO2 25 20 - 29 mmol/L   Calcium 9.6 8.7 - 10.3 mg/dL   Total Protein 6.9 6.0 - 8.5 g/dL   Albumin 4.0 3.9 - 4.9 g/dL   Globulin, Total 2.9 1.5 - 4.5 g/dL   Albumin/Globulin Ratio 1.4 1.2 - 2.2    Bilirubin Total 0.3 0.0 - 1.2 mg/dL   Alkaline Phosphatase 79 44 - 121 IU/L   AST 15 0 - 40 IU/L   ALT 9 0 - 32 IU/L  Lipid panel     Status: Abnormal   Collection Time: 10/01/22 10:10 AM  Result Value Ref Range   Cholesterol, Total 197 100 - 199 mg/dL   Triglycerides 72 0 - 149 mg/dL   HDL 78 >39 mg/dL   VLDL Cholesterol Cal 13 5 - 40 mg/dL   LDL Chol Calc (NIH) 106 (H) 0 - 99 mg/dL   Chol/HDL Ratio 2.5 0.0 - 4.4 ratio    Comment:                                   T. Chol/HDL Ratio                                             Men  Women                               1/2 Avg.Risk  3.4    3.3                                   Avg.Risk  5.0    4.4                                2X Avg.Risk  9.6    7.1                                3X Avg.Risk 23.4   11.0   TSH     Status: Abnormal  Collection Time: 10/01/22 10:10 AM  Result Value Ref Range   TSH 22.400 (H) 0.450 - 4.500 uIU/mL  T4, free     Status: None   Collection Time: 10/01/22 10:10 AM  Result Value Ref Range   Free T4 1.45 0.82 - 1.77 ng/dL  Thyroglobulin Level     Status: None   Collection Time: 10/01/22 10:10 AM  Result Value Ref Range   Thyroglobulin (TG-RIA) <2.0 ng/mL    Comment: This test was developed and its performance characteristics determined by [facility]. It has not been cleared or approved by the Food and Drug Administration. Reference Range: Pubertal Children and Adults: <40 According to the [facility] of Clinical Biochemistry, the reference interval for Thyroglobulin (TG) should be related to euthyroid patients and not for patients who underwent thyroidectomy.  TG reference intervals for these patients depend on the residual mass of the thyroid tissue left after surgery.  Establishing a post-operative baseline is recommended.  The assay quantitation limit is 2.0 ng/mL.   HgB A1c     Status: None   Collection Time: 10/08/22 10:02 AM  Result Value Ref Range   Hemoglobin A1C     HbA1c POC (<>  result, manual entry)     HbA1c, POC (prediabetic range)     HbA1c, POC (controlled diabetic range) 5.8 0.0 - 7.0 %    Calcium 10.5 above target on August 27, 2018   Thyroid/neck ultrasound on June 24, 2018 No discrete nodules are seen within the thyroidectomy bed.   IMPRESSION: No residual or recurrent tissue post total thyroidectomy.   Thyrogen stimulated whole-body scan on September 21, 2019 FINDINGS: No focal uptake in the thyroid bed. Mild uptake in the mediastinum is less than exam from 06/14/2017. Physiologic uptake noted in the salivary glands and GI tract as well as a GU tract.   IMPRESSION: No scintigraphic thyroid cancer recurrence.  August 19, 2020 thyroid/neck ultrasound IMPRESSION: Post total thyroidectomy without evidence of residual or locally recurrent disease.  Assessment & Plan:   1. Malignant neoplasm of thyroid gland (HCC)  - She has had multifocal FVPTC ( 1.2 and 0.3 cms), stage 1 , with no evidence of lymphovascular invasion. She is s/p total thyroidectomy February 2014. She is s/p rTSH stimulated RAI remnant ablation with post therapy scan which is negative for distant spread on 09/05/2012. Her stimulated TG level was 1.9 along with <20 Tg Abs.  her surveillance thyroid u/s is negative for any residual thyroid tissue x 1. Her last neck/ thyroid ultrasound is s/f surgically absent thyroid .  Second surveillance study with rTSH WBS on 02/18/2015 showed a "focus of abnormal tracer accumulation in mid chest at the midline highly suspicious for metastatic disease potentially to a mediastinal lymph node". - CT with contrast of her chest and neck were unremarkable, might have been artifactual.     Her  rTSH stimulated whole-body scan on 12/09/2015 is reported as unremarkable,  Specifically, focus of abnormal uptake seen within the mid chest on the previous exam is no longer identified.  -  thyroglobulin level is 0.1 improving from 1.9 ,  with  antithyroglobulin antibodies at <0.1.  -  Her surveillance thyroid/neck ultrasound on 04/03/2016 was unremarkable,  showing surgically absent thyroid. - Her most recent Thyrogen stimulated whole-body scan from 06/14/2017 and thyroglobulin level before this visit where reassuring with no evidence of distant iodine avid metastasis. -Her previsit unstimulated thyroglobulin level is 0.1 along with thyroglobulin antibodies.  June 24, 2018 surveillance thyroid/neck ultrasound: No  residual or recurrent tissue post total thyroidectomy.  -Her most recent Thyrogen stimulated whole-body scan on September 21, 2019 was negative for evidence of tumor recurrence.  Her last thyroid/neck ultrasound from February 2022 was consistent with total thyroidectomy without evidence of residual or locally recurrent disease.    She will be considered for repeat/surveillance thyroid ultrasound before her next visit in 6 months.      2. Postsurgical hypothyroidism - Post surgical hypothyroidism on suppressive therapy: Her previsit thyroid function tests are consistent with under replacement.  I discussed and increase her levothyroxine to 100 mcg p.o. daily before breakfast.     - We discussed about the correct intake of her thyroid hormone, on empty stomach at fasting, with water, separated by at least 30 minutes from breakfast and other medications,  and separated by more than 4 hours from calcium, iron, multivitamins, acid reflux medications (PPIs). -Patient is made aware of the fact that thyroid hormone replacement is needed for life, dose to be adjusted by periodic monitoring of thyroid function tests.    3.  hypertension  -She is advised to be consistent on her blood pressure medications including Tribenzor 40-5-20 5 mg p.o. daily.    4. Pre-diabetes -Her point-of-care A1c is at 5.8%, overall improving from 6.2%.  She is tolerating and benefiting from low-dose metformin.  She is advised to continue metformin  500 mg p.o. daily at breakfast.    5) hypercalcemia : Resolved after she stopped her calcium supplements.  She is advised to continue vitamin D3 2000 units daily.    - I advised patient to maintain close follow up with Sharilyn Sites, MD for primary care needs.   I spent  25 minutes in the care of the patient today including review of labs from Thyroid Function, CMP, and other relevant labs ; imaging/biopsy records (current and previous including abstractions from other facilities); face-to-face time discussing  her lab results and symptoms, medications doses, her options of short and long term treatment based on the latest standards of care / guidelines;   and documenting the encounter.  Sophia Wilson  participated in the discussions, expressed understanding, and voiced agreement with the above plans.  All questions were answered to her satisfaction. she is encouraged to contact clinic should she have any questions or concerns prior to her return visit.   Follow up plan: Return in about 6 months (around 04/10/2023) for Thyroid / Neck Ultrasound.  Glade Lloyd, MD Phone: [phone]  Fax: [phone]  -  This note was partially dictated with voice recognition software. Similar sounding words can be transcribed inadequately or may not  be corrected upon review.  10/08/2022, 2:33 PM

## 2022-10-23 ENCOUNTER — Ambulatory Visit (HOSPITAL_COMMUNITY)
Admission: RE | Admit: 2022-10-23 | Discharge: 2022-10-23 | Disposition: A | Discharge status: Home / Self Care | Payer: BC Managed Care – PPO | Source: Ambulatory Visit | Attending: "Endocrinology | Admitting: "Endocrinology

## 2022-10-23 DIAGNOSIS — E890 Postprocedural hypothyroidism: Secondary | ICD-10-CM | POA: Diagnosis not present

## 2022-10-23 DIAGNOSIS — C73 Malignant neoplasm of thyroid gland: Secondary | ICD-10-CM | POA: Insufficient documentation

## 2022-11-12 DIAGNOSIS — F458 Other somatoform disorders: Secondary | ICD-10-CM | POA: Diagnosis not present

## 2022-11-12 DIAGNOSIS — R09A2 Foreign body sensation, throat: Secondary | ICD-10-CM | POA: Diagnosis not present

## 2022-12-27 ENCOUNTER — Encounter: Payer: Self-pay | Admitting: Gastroenterology

## 2022-12-27 ENCOUNTER — Encounter: Payer: Self-pay | Admitting: Internal Medicine

## 2023-01-08 ENCOUNTER — Other Ambulatory Visit: Payer: Self-pay | Admitting: "Endocrinology

## 2023-01-31 ENCOUNTER — Encounter: Payer: Self-pay | Admitting: "Endocrinology

## 2023-01-31 DIAGNOSIS — Z7984 Long term (current) use of oral hypoglycemic drugs: Secondary | ICD-10-CM | POA: Diagnosis not present

## 2023-01-31 DIAGNOSIS — E119 Type 2 diabetes mellitus without complications: Secondary | ICD-10-CM | POA: Diagnosis not present

## 2023-01-31 DIAGNOSIS — H2513 Age-related nuclear cataract, bilateral: Secondary | ICD-10-CM | POA: Diagnosis not present

## 2023-01-31 LAB — HM DIABETES EYE EXAM

## 2023-02-11 DIAGNOSIS — R09A2 Foreign body sensation, throat: Secondary | ICD-10-CM | POA: Diagnosis not present

## 2023-02-11 DIAGNOSIS — K219 Gastro-esophageal reflux disease without esophagitis: Secondary | ICD-10-CM | POA: Insufficient documentation

## 2023-02-11 DIAGNOSIS — J392 Other diseases of pharynx: Secondary | ICD-10-CM | POA: Insufficient documentation

## 2023-04-03 ENCOUNTER — Other Ambulatory Visit: Payer: Self-pay

## 2023-04-03 MED ORDER — LEVOTHYROXINE SODIUM 100 MCG PO TABS: 100.0000 ug | ORAL_TABLET | Freq: Every day | ORAL | 0 refills | Status: DC

## 2023-04-08 DIAGNOSIS — C73 Malignant neoplasm of thyroid gland: Secondary | ICD-10-CM | POA: Diagnosis not present

## 2023-04-08 DIAGNOSIS — E890 Postprocedural hypothyroidism: Secondary | ICD-10-CM | POA: Diagnosis not present

## 2023-04-15 ENCOUNTER — Ambulatory Visit: Payer: BC Managed Care – PPO | Admitting: "Endocrinology

## 2023-04-15 ENCOUNTER — Encounter: Payer: Self-pay | Admitting: "Endocrinology

## 2023-04-15 VITALS — BP 126/82 | HR 84 | Ht 66.0 in | Wt 214.6 lb

## 2023-04-15 DIAGNOSIS — E559 Vitamin D deficiency, unspecified: Secondary | ICD-10-CM

## 2023-04-15 DIAGNOSIS — R7303 Prediabetes: Secondary | ICD-10-CM | POA: Diagnosis not present

## 2023-04-15 DIAGNOSIS — C73 Malignant neoplasm of thyroid gland: Secondary | ICD-10-CM | POA: Diagnosis not present

## 2023-04-15 DIAGNOSIS — E890 Postprocedural hypothyroidism: Secondary | ICD-10-CM

## 2023-04-15 DIAGNOSIS — E782 Mixed hyperlipidemia: Secondary | ICD-10-CM | POA: Insufficient documentation

## 2023-04-15 LAB — POCT GLYCOSYLATED HEMOGLOBIN (HGB A1C): HbA1c, POC (controlled diabetic range): 6.1 % (ref 0.0–7.0)

## 2023-04-15 MED ORDER — METFORMIN HCL 500 MG PO TABS: 500.0000 mg | ORAL_TABLET | Freq: Every day | ORAL | 1 refills | Status: DC

## 2023-04-15 MED ORDER — LEVOTHYROXINE SODIUM 112 MCG PO TABS: 112.0000 ug | ORAL_TABLET | Freq: Every day | ORAL | 1 refills | Status: DC

## 2023-04-15 NOTE — Progress Notes (Signed)
04/15/2023       Endocrinology follow-up note  Subjective:    Patient ID: Sophia Wilson, female    DOB: 1955/12/08, PCP Assunta Found, MD   Past Medical History:  Diagnosis Date   Anemia    Cancer [facility])    thyroid cancer   Collagen vascular disease (HCC)    Diabetes mellitus without complication (HCC)    Eczema    Fibroid tumor    GERD (gastroesophageal reflux disease)    H/O seasonal allergies    Herpes simplex without mention of complication    rt thigh   Hypertension    Thyroid disease    cancer   Vitiligo    Past Surgical History:  Procedure Laterality Date   ABDOMINAL HYSTERECTOMY     BIOPSY  04/10/2022   Procedure: BIOPSY;  Surgeon: Lanelle Bal, DO;  Location: [facility];  Service: Endoscopy;;   COLONOSCOPY  03/10/2012   Surgeon: West Bali, MD;  moderate size internal hemorrhoids, otherwise normal exam with recommendations to repeat in 10 years.   COLONOSCOPY WITH PROPOFOL N/A 04/10/2022   internal hemorrhoids.   ESOPHAGOGASTRODUODENOSCOPY (EGD) WITH PROPOFOL N/A 04/10/2022   gastritis (negative H.pylori), normal duodenum.   THYROIDECTOMY  08/20/2012   Procedure: THYROIDECTOMY;  Surgeon: Dalia Heading, MD;  Location: [facility] ORS;  Service: General;  Laterality: N/A;  Total Thyroidectomy   Social History   Socioeconomic History   Marital status: Married    Spouse name: Not on file   Number of children: 1   Years of education: Not on file   Highest education level: Not on file  Occupational History   Not on file  Tobacco Use   Smoking status: Never   Smokeless tobacco: Never  Vaping Use   Vaping status: Never Used  Substance and Sexual Activity   Alcohol use: No   Drug use: No   Sexual activity: Yes    Birth control/protection: Surgical    Comment: hysterectomy  Other Topics Concern   Not on file  Social History Narrative   Not on file   Social Determinants of Health   Financial Resource Strain: Low Risk  (08/01/2021)   Overall  Financial Resource Strain (CARDIA)    Difficulty of Paying Living Expenses: Not very hard  Food Insecurity: Low Risk  (02/11/2023)   Received from [facility]   Hunger Vital Sign    Worried About Running Out of Food in the Last Year: Never true    Ran Out of Food in the Last Year: Never true  Transportation Needs: Not on file (02/11/2023)  Physical Activity: Inactive (08/01/2021)   Exercise Vital Sign    Days of Exercise per Week: 0 days    Minutes of Exercise per Session: 0 min  Stress: No Stress Concern Present (08/01/2021)   [employer] of Occupational Health - Occupational Stress Questionnaire    Feeling of Stress : Not at all  Social Connections: Moderately Integrated (08/01/2021)   Social Connection and Isolation Panel [NHANES]    Frequency of Communication with Friends and Family: Twice a week    Frequency of Social Gatherings with Friends and Family: Once a week    Attends Religious Services: 1 to 4 times per year    Active Member of [employer] or Organizations: No    Attends Banker Meetings: Never    Marital Status: Married   Outpatient Encounter Medications as of 04/15/2023  Medication Sig   aspirin EC 81 MG tablet Take  81 mg by mouth daily.   [employer] D3 50 MCG (2000 UT) CAPS TAKE 1 CAPSULE (2,000 UNITS TOTAL) BY MOUTH DAILY WITH BREAKFAST.   fluticasone (FLONASE) 50 MCG/ACT nasal spray Place 2 sprays into the nose daily as needed for allergies.   gabapentin (NEURONTIN) 300 MG capsule Take 300 mg by mouth 3 (three) times daily.   levothyroxine (SYNTHROID) 112 MCG tablet Take 1 tablet (112 mcg total) by mouth daily before breakfast.   loratadine (CLARITIN) 10 MG tablet Take 10 mg by mouth daily as needed for allergies.   meloxicam (MOBIC) 15 MG tablet Take 15 mg by mouth daily.   metFORMIN (GLUCOPHAGE) 500 MG tablet Take 1 tablet (500 mg total) by mouth daily.   Olmesartan-amLODIPine-HCTZ 40-5-25 MG TABS TAKE (1) TABLET BY MOUTH DAILY - NEED FOLLOW UP FOR MORE  REFILLS.   pantoprazole (PROTONIX) 40 MG tablet Take 40 mg by mouth daily.   triamcinolone cream (KENALOG) 0.5 % Apply 1 application  topically daily as needed (irritation).   [DISCONTINUED] levothyroxine (SYNTHROID) 100 MCG tablet Take 1 tablet (100 mcg total) by mouth daily before breakfast.   [DISCONTINUED] metFORMIN (GLUCOPHAGE) 500 MG tablet TAKE ONE TABLET BY MOUTH DAILY.   No facility-administered encounter medications on file as of 04/15/2023.   ALLERGIES: Allergies  Allergen Reactions   Sulfonamide Derivatives Hives   VACCINATION STATUS: Immunization History  Administered Date(s) Administered   Influenza,inj,Quad PF,6+ Mos 04/18/2018    HPI -67 year old female patient with medical history as above.       -She is being seen in follow-up for her history of papillary thyroid cancer, postsurgical hypothyroidism, hypocalcemia, prediabetes. - she underwent thyroidectomy for papillary thyroid cancer in February 2014,  and rTSH stimulated RAI thyroid remnant ablation on 09/05/2012.  her pathology revealed multifocal 1.2, and 0.3 cms follicular variant PTC.  Her surveillance neck ultrasound is negative for any residual thyroid tissue x2, last neck sonogram in 2017.  Second surveillance study with rTSH WBS showed a "focus of abnormal tracer accumulation in mid chest at the midline highly suspicious for metastatic disease potentially to a mediastinal lymph node". She was sent for CT of thorax/neck with contrast which she underwent on 03/18/2015. This was reported as unremarkable, might have been artifactual. -Subsequent surveillance study with rTSH stimulation showed absence of the previously noticed abnormal tracer accumulation in the mid chest.  - Her most recent whole-body scan with Thyrogen reveals no evidence of distant iodine avid metastasis in November 2018. -Her surveillance thyroid/neck ultrasound on June 24, 2018 was negative for any residual thyroid tissue or recurrence.     -Her most recent Thyrogen stimulated whole-body scan on September 21, 2019 was negative for evidence of tumor recurrence. -Her last thyroid/neck ultrasound was from February 2022 which was negative for thyroid remnant/recurrence.     She is currently on levothyroxine 100 mcg p.o. daily before breakfast.  She reports compliance and consistency with her medication.  Her previsit thyroid function tests are consistent with under replacement.   She denies dysphagia, SOB, nor voice change.  She has no new complaints today. -She continues to feel better.      -She denies any family hx of thyroid dysfunction nor cancer.  -She is on metformin 500 mg p.o. daily at breakfast.  She returns with steady weight, her point-of-care A1c increasing to 6.1% from 5.8%.     She is not monitoring blood glucose regularly.     Review of Systems Limited as above.  Objective:    BP  126/82   Pulse 84   Ht 5\' 6"  (1.676 m)   Wt 214 lb 9.6 oz (97.3 kg)   BMI 34.64 kg/m   Wt Readings from Last 3 Encounters:  04/15/23 214 lb 9.6 oz (97.3 kg)  10/08/22 217 lb 12.8 oz (98.8 kg)  07/31/22 219 lb 6.4 oz (99.5 kg)    complete Blood Count (Most recent): Lab Results  Component Value Date   WBC 6.3 08/21/2012   HGB 10.0 (L) 08/21/2012   HCT 29.9 (L) 08/21/2012   MCV 81.7 08/21/2012   PLT 241 08/21/2012    Recent Results (from the past 2160 hour(s))  HM DIABETES EYE EXAM     Status: None   Collection Time: 01/31/23 12:00 AM  Result Value Ref Range   HM Diabetic Eye Exam No Retinopathy No Retinopathy  TSH     Status: Abnormal   Collection Time: 04/08/23  8:31 AM  Result Value Ref Range   TSH 13.000 (H) 0.450 - 4.500 uIU/mL  T4, free     Status: None   Collection Time: 04/08/23  8:31 AM  Result Value Ref Range   Free T4 1.57 0.82 - 1.77 ng/dL  Thyroglobulin Level     Status: None (Preliminary result)   Collection Time: 04/08/23  8:31 AM  Result Value Ref Range   Thyroglobulin (TG-RIA) WILL FOLLOW    Thyroglobulin antibody     Status: None   Collection Time: 04/08/23  8:31 AM  Result Value Ref Range   Thyroglobulin Antibody <1.0 0.0 - 0.9 IU/mL    Comment: Thyroglobulin Antibody measured by [employer] Coulter Methodology It should be noted that the presence of thyroglobulin antibodies may not be pathogenic nor diagnostic, especially at very low levels. The assay manufacturer has found that four percent of individuals without evidence of thyroid disease or autoimmunity will have positive TgAb levels up to 4 IU/mL.   HgB A1c     Status: None   Collection Time: 04/15/23 10:02 AM  Result Value Ref Range   Hemoglobin A1C     HbA1c POC (<> result, manual entry)     HbA1c, POC (prediabetic range)     HbA1c, POC (controlled diabetic range) 6.1 0.0 - 7.0 %    Calcium 10.5 above target on August 27, 2018   Thyroid/neck ultrasound on June 24, 2018 No discrete nodules are seen within the thyroidectomy bed.   IMPRESSION: No residual or recurrent tissue post total thyroidectomy.   Thyrogen stimulated whole-body scan on September 21, 2019 FINDINGS: No focal uptake in the thyroid bed. Mild uptake in the mediastinum is less than exam from 06/14/2017. Physiologic uptake noted in the salivary glands and GI tract as well as a GU tract.   IMPRESSION: No scintigraphic thyroid cancer recurrence.  August 19, 2020 thyroid/neck ultrasound IMPRESSION: Post total thyroidectomy without evidence of residual or locally recurrent disease.  Assessment & Plan:   1. Malignant neoplasm of thyroid gland (HCC)  - She has had multifocal FVPTC ( 1.2 and 0.3 cms), stage 1 , with no evidence of lymphovascular invasion. She is s/p total thyroidectomy February 2014. She is s/p rTSH stimulated RAI remnant ablation with post therapy scan which is negative for distant spread on 09/05/2012. Her stimulated TG level was 1.9 along with <20 Tg Abs.  her surveillance thyroid u/s is negative for any residual  thyroid tissue x 1. Her last neck/ thyroid ultrasound is s/f surgically absent thyroid .  Second surveillance study with rTSH WBS on 02/18/2015 showed  a "focus of abnormal tracer accumulation in mid chest at the midline highly suspicious for metastatic disease potentially to a mediastinal lymph node". - CT with contrast of her chest and neck were unremarkable, might have been artifactual.     Her  rTSH stimulated whole-body scan on 12/09/2015 is reported as unremarkable,  Specifically, focus of abnormal uptake seen within the mid chest on the previous exam is no longer identified.  -  thyroglobulin level is 0.1 improving from 1.9 ,  with antithyroglobulin antibodies at <0.1.  -  Her surveillance thyroid/neck ultrasound on 04/03/2016 was unremarkable,  showing surgically absent thyroid. - Her most recent Thyrogen stimulated whole-body scan from 06/14/2017 and thyroglobulin level before this visit where reassuring with no evidence of distant iodine avid metastasis. -Her previsit unstimulated thyroglobulin level is 0.1 along with thyroglobulin antibodies.  June 24, 2018 surveillance thyroid/neck ultrasound: No residual or recurrent tissue post total thyroidectomy.  -Her most recent Thyrogen stimulated whole-body scan on September 21, 2019 was negative for evidence of tumor recurrence.  Her last thyroid/neck ultrasound from February 2022 was consistent with total thyroidectomy without evidence of residual or locally recurrent disease.    Her surveillance thyroid/neck ultrasound on October 23, 2022 was negative for tumor recurrence or residual tissue.      2. Postsurgical hypothyroidism - Post surgical hypothyroidism on suppressive therapy: Her previsit thyroid function tests are consistent with under replacement.  I discussed and increased her levothyroxine to 112 mcg p.o. daily before breakfast.     - We discussed about the correct intake of her thyroid hormone, on empty stomach at fasting,  with water, separated by at least 30 minutes from breakfast and other medications,  and separated by more than 4 hours from calcium, iron, multivitamins, acid reflux medications (PPIs). -Patient is made aware of the fact that thyroid hormone replacement is needed for life, dose to be adjusted by periodic monitoring of thyroid function tests.    3.  hypertension  -She is advised to be consistent on her blood pressure medications including Tribenzor 40-5-20 5 mg p.o. daily.    4. Pre-diabetes -Her point-of-care A1c is at 6.1%, increasing from 5.8%.  She is advised to continue metformin 500 mg p.o. daily at breakfast.  She is advised to avoid ultra processed food and drinks.  5) vitamin D deficiency:  She is advised to continue vitamin D3 2000 units daily.   6) she has mild hyperlipidemia with LDL of 106.  She is not on statins.  She will have repeat labs before next visit. - I advised patient to maintain close follow up with Assunta Found, MD for primary care needs.   I spent  26  minutes in the care of the patient today including review of labs from Thyroid Function, CMP, and other relevant labs ; imaging/biopsy records (current and previous including abstractions from other facilities); face-to-face time discussing  her lab results and symptoms, medications doses, her options of short and long term treatment based on the latest standards of care / guidelines;   and documenting the encounter.  Sophia Wilson  participated in the discussions, expressed understanding, and voiced agreement with the above plans.  All questions were answered to her satisfaction. she is encouraged to contact clinic should she have any questions or concerns prior to her return visit.   Follow up plan: Return in about 6 months (around 10/13/2023) for Fasting Labs  in AM B4 8, A1c -[place].  Marquis Lunch, MD Phone: [phone]  Fax:  [phone]  -  This note was partially dictated with voice recognition software.  Similar sounding words can be transcribed inadequately or may not  be corrected upon review.  04/15/2023, 2:30 PM

## 2023-04-15 NOTE — Patient Instructions (Signed)
                                     Advice for Weight Management  -For most of us the best way to lose weight is by diet management. Generally speaking, diet management means consuming less calories intentionally which over time brings about progressive weight loss.  This can be achieved more effectively by avoiding ultra processed carbohydrates, processed meats, unhealthy fats.    It is critically important to know your numbers: how much calorie you are consuming and how much calorie you need. More importantly, our carbohydrates sources should be unprocessed naturally occurring  complex starch food items.  It is always important to balance nutrition also by  appropriate intake of proteins (mainly plant-based), healthy fats/oils, plenty of fruits and vegetables.   -The American College of Lifestyle Medicine (ACL M) recommends nutrition derived mostly from Whole Food, Plant Predominant Sources example an apple instead of applesauce or apple pie. Eat Plenty of vegetables, Mushrooms, fruits, Legumes, Whole Grains, Nuts, seeds in lieu of processed meats, processed snacks/pastries red meat, poultry, eggs.  Use only water or unsweetened tea for hydration.  The College also recommends the need to stay away from risky substances including alcohol, smoking; obtaining 7-9 hours of restorative sleep, at least 150 minutes of moderate intensity exercise weekly, importance of healthy social connections, and being mindful of stress and seek help when it is overwhelming.    -Sticking to a routine mealtime to eat 3 meals a day and avoiding unnecessary snacks is shown to have a big role in weight control. Under normal circumstances, the only time we burn stored energy is when we are hungry, so allow  some hunger to take place- hunger means no food between appropriate meal times, only water.  It is not advisable to starve.   -It is better to avoid simple carbohydrates including:  Cakes, Sweet Desserts, Ice Cream, Soda (diet and regular), Sweet Tea, Candies, Chips, Cookies, Store Bought Juices, Alcohol in Excess of  1-2 drinks a day, Lemonade,  Artificial Sweeteners, Doughnuts, Coffee Creamers, "Sugar-free" Products, etc, etc.  This is not a complete list.....    -Consulting with certified diabetes educators is proven to provide you with the most accurate and current information on diet.  Also, you may be  interested in discussing diet options/exchanges , we can schedule a visit with Sophia Wilson, RDN, CDE for individualized nutrition education.  -Exercise: If you are able: 30 -60 minutes a day ,4 days a week, or 150 minutes of moderate intensity exercise weekly.    The longer the better if tolerated.  Combine stretch, strength, and aerobic activities.  If you were told in the past that you have high risk for cardiovascular diseases, or if you are currently symptomatic, you may seek evaluation by your heart doctor prior to initiating moderate to intense exercise programs.                                  Additional Care Considerations for Diabetes/Prediabetes   -Diabetes  is a chronic disease.  The most important care consideration is regular follow-up with your diabetes care provider with the goal being avoiding or delaying its complications and to take advantage of advances in medications and technology.  If appropriate actions are taken early enough, type 2 diabetes can even be   reversed.  Seek information from the right source.  - Whole Food, Plant Predominant Nutrition is highly recommended: Eat Plenty of vegetables, Mushrooms, fruits, Legumes, Whole Grains, Nuts, seeds in lieu of processed meats, processed snacks/pastries red meat, poultry, eggs as recommended by American College of  Lifestyle Medicine (ACLM).  -Type 2 diabetes is known to coexist with other important comorbidities such as high blood pressure and high cholesterol.  It is critical to control not only the  diabetes but also the high blood pressure and high cholesterol to minimize and delay the risk of complications including coronary artery disease, stroke, amputations, blindness, etc.  The good news is that this diet recommendation for type 2 diabetes is also very helpful for managing high cholesterol and high blood blood pressure.  - Studies showed that people with diabetes will benefit from a class of medications known as ACE inhibitors and statins.  Unless there are specific reasons not to be on these medications, the standard of care is to consider getting one from these groups of medications at an optimal doses.  These medications are generally considered safe and proven to help protect the heart and the kidneys.    - People with diabetes are encouraged to initiate and maintain regular follow-up with eye doctors, foot doctors, dentists , and if necessary heart and kidney doctors.     - It is highly recommended that people with diabetes quit smoking or stay away from smoking, and get yearly  flu vaccine and pneumonia vaccine at least every 5 years.  See above for additional recommendations on exercise, sleep, stress management , and healthy social connections.      

## 2023-04-19 LAB — THYROGLOBULIN ANTIBODY: Thyroglobulin Antibody: <1 [IU]/mL (ref 0.0–0.9)

## 2023-04-19 LAB — TSH: TSH: 13 u[IU]/mL — ABNORMAL HIGH (ref 0.450–4.500)

## 2023-04-19 LAB — THYROGLOBULIN LEVEL: Thyroglobulin (TG-RIA): <2 ng/mL

## 2023-04-19 LAB — T4, FREE: Free T4: 1.57 ng/dL (ref 0.82–1.77)

## 2023-05-29 ENCOUNTER — Other Ambulatory Visit (HOSPITAL_COMMUNITY): Payer: Self-pay | Admitting: Family Medicine

## 2023-05-29 DIAGNOSIS — Z1231 Encounter for screening mammogram for malignant neoplasm of breast: Secondary | ICD-10-CM

## 2023-06-03 DIAGNOSIS — E039 Hypothyroidism, unspecified: Secondary | ICD-10-CM | POA: Diagnosis not present

## 2023-06-03 DIAGNOSIS — Z23 Encounter for immunization: Secondary | ICD-10-CM | POA: Diagnosis not present

## 2023-06-03 DIAGNOSIS — E7849 Other hyperlipidemia: Secondary | ICD-10-CM | POA: Diagnosis not present

## 2023-06-03 DIAGNOSIS — Z0001 Encounter for general adult medical examination with abnormal findings: Secondary | ICD-10-CM | POA: Diagnosis not present

## 2023-06-03 DIAGNOSIS — E6609 Other obesity due to excess calories: Secondary | ICD-10-CM | POA: Diagnosis not present

## 2023-06-03 DIAGNOSIS — I10 Essential (primary) hypertension: Secondary | ICD-10-CM | POA: Diagnosis not present

## 2023-06-03 DIAGNOSIS — Z1331 Encounter for screening for depression: Secondary | ICD-10-CM | POA: Diagnosis not present

## 2023-06-03 DIAGNOSIS — Z6832 Body mass index (BMI) 32.0-32.9, adult: Secondary | ICD-10-CM | POA: Diagnosis not present

## 2023-06-03 DIAGNOSIS — E1159 Type 2 diabetes mellitus with other circulatory complications: Secondary | ICD-10-CM | POA: Diagnosis not present

## 2023-07-01 ENCOUNTER — Ambulatory Visit (HOSPITAL_COMMUNITY)
Admission: RE | Admit: 2023-07-01 | Discharge: 2023-07-01 | Disposition: A | Discharge status: Home / Self Care | Payer: BC Managed Care – PPO | Source: Ambulatory Visit | Attending: Family Medicine | Admitting: Family Medicine

## 2023-07-01 DIAGNOSIS — Z1231 Encounter for screening mammogram for malignant neoplasm of breast: Secondary | ICD-10-CM | POA: Insufficient documentation

## 2023-09-25 DIAGNOSIS — E538 Deficiency of other specified B group vitamins: Secondary | ICD-10-CM | POA: Diagnosis not present

## 2023-09-25 DIAGNOSIS — D649 Anemia, unspecified: Secondary | ICD-10-CM | POA: Diagnosis not present

## 2023-09-25 DIAGNOSIS — E782 Mixed hyperlipidemia: Secondary | ICD-10-CM | POA: Diagnosis not present

## 2023-09-25 DIAGNOSIS — E039 Hypothyroidism, unspecified: Secondary | ICD-10-CM | POA: Diagnosis not present

## 2023-09-25 DIAGNOSIS — I10 Essential (primary) hypertension: Secondary | ICD-10-CM | POA: Diagnosis not present

## 2023-09-25 DIAGNOSIS — E1159 Type 2 diabetes mellitus with other circulatory complications: Secondary | ICD-10-CM | POA: Diagnosis not present

## 2023-09-25 DIAGNOSIS — R609 Edema, unspecified: Secondary | ICD-10-CM | POA: Diagnosis not present

## 2023-09-25 DIAGNOSIS — Z6832 Body mass index (BMI) 32.0-32.9, adult: Secondary | ICD-10-CM | POA: Diagnosis not present

## 2023-09-25 DIAGNOSIS — E6609 Other obesity due to excess calories: Secondary | ICD-10-CM | POA: Diagnosis not present

## 2023-10-07 DIAGNOSIS — E559 Vitamin D deficiency, unspecified: Secondary | ICD-10-CM | POA: Diagnosis not present

## 2023-10-07 DIAGNOSIS — E782 Mixed hyperlipidemia: Secondary | ICD-10-CM | POA: Diagnosis not present

## 2023-10-07 DIAGNOSIS — C73 Malignant neoplasm of thyroid gland: Secondary | ICD-10-CM | POA: Diagnosis not present

## 2023-10-07 DIAGNOSIS — R7303 Prediabetes: Secondary | ICD-10-CM | POA: Diagnosis not present

## 2023-10-07 DIAGNOSIS — E890 Postprocedural hypothyroidism: Secondary | ICD-10-CM | POA: Diagnosis not present

## 2023-10-10 ENCOUNTER — Other Ambulatory Visit: Payer: Self-pay | Admitting: "Endocrinology

## 2023-10-14 ENCOUNTER — Encounter: Payer: Self-pay | Admitting: "Endocrinology

## 2023-10-14 ENCOUNTER — Ambulatory Visit: Payer: BC Managed Care – PPO | Admitting: "Endocrinology

## 2023-10-14 VITALS — BP 114/74 | HR 64 | Ht 66.0 in | Wt 212.4 lb

## 2023-10-14 DIAGNOSIS — E559 Vitamin D deficiency, unspecified: Secondary | ICD-10-CM | POA: Diagnosis not present

## 2023-10-14 DIAGNOSIS — C73 Malignant neoplasm of thyroid gland: Secondary | ICD-10-CM | POA: Diagnosis not present

## 2023-10-14 DIAGNOSIS — E890 Postprocedural hypothyroidism: Secondary | ICD-10-CM | POA: Diagnosis not present

## 2023-10-14 DIAGNOSIS — R7303 Prediabetes: Secondary | ICD-10-CM

## 2023-10-14 DIAGNOSIS — E782 Mixed hyperlipidemia: Secondary | ICD-10-CM

## 2023-10-14 LAB — POCT GLYCOSYLATED HEMOGLOBIN (HGB A1C): HbA1c, POC (controlled diabetic range): 6.2 % (ref 0.0–7.0)

## 2023-10-14 MED ORDER — LEVOTHYROXINE SODIUM 112 MCG PO TABS: 112.0000 ug | ORAL_TABLET | Freq: Every day | ORAL | 1 refills | Status: DC

## 2023-10-14 NOTE — Progress Notes (Signed)
 10/14/2023       Endocrinology follow-up note  Subjective:    Patient ID: Sophia Wilson, female    DOB: 1955-07-29, PCP Assunta Found, MD   Past Medical History:  Diagnosis Date   Anemia    Cancer [facility])    thyroid cancer   Collagen vascular disease (HCC)    Diabetes mellitus without complication (HCC)    Eczema    Fibroid tumor    GERD (gastroesophageal reflux disease)    H/O seasonal allergies    Herpes simplex without mention of complication    rt thigh   Hypertension    Thyroid disease    cancer   Vitiligo    Past Surgical History:  Procedure Laterality Date   ABDOMINAL HYSTERECTOMY     BIOPSY  04/10/2022   Procedure: BIOPSY;  Surgeon: Lanelle Bal, DO;  Location: [facility];  Service: Endoscopy;;   COLONOSCOPY  03/10/2012   Surgeon: West Bali, MD;  moderate size internal hemorrhoids, otherwise normal exam with recommendations to repeat in 10 years.   COLONOSCOPY WITH PROPOFOL N/A 04/10/2022   internal hemorrhoids.   ESOPHAGOGASTRODUODENOSCOPY (EGD) WITH PROPOFOL N/A 04/10/2022   gastritis (negative H.pylori), normal duodenum.   THYROIDECTOMY  08/20/2012   Procedure: THYROIDECTOMY;  Surgeon: Dalia Heading, MD;  Location: [facility] ORS;  Service: General;  Laterality: N/A;  Total Thyroidectomy   Social History   Socioeconomic History   Marital status: Married    Spouse name: Not on file   Number of children: 1   Years of education: Not on file   Highest education level: Not on file  Occupational History   Not on file  Tobacco Use   Smoking status: Never   Smokeless tobacco: Never  Vaping Use   Vaping status: Never Used  Substance and Sexual Activity   Alcohol use: No   Drug use: No   Sexual activity: Yes    Birth control/protection: Surgical    Comment: hysterectomy  Other Topics Concern   Not on file  Social History Narrative   Not on file   Social Drivers of Health   Financial Resource Strain: Low Risk  (08/01/2021)   Overall  Financial Resource Strain (CARDIA)    Difficulty of Paying Living Expenses: Not very hard  Food Insecurity: Low Risk  (02/11/2023)   Received from [facility]   Hunger Vital Sign    Worried About Running Out of Food in the Last Year: Never true    Ran Out of Food in the Last Year: Never true  Transportation Needs: Not on file (02/11/2023)  Physical Activity: Inactive (08/01/2021)   Exercise Vital Sign    Days of Exercise per Week: 0 days    Minutes of Exercise per Session: 0 min  Stress: No Stress Concern Present (08/01/2021)   [employer] of Occupational Health - Occupational Stress Questionnaire    Feeling of Stress : Not at all  Social Connections: Moderately Integrated (08/01/2021)   Social Connection and Isolation Panel [NHANES]    Frequency of Communication with Friends and Family: Twice a week    Frequency of Social Gatherings with Friends and Family: Once a week    Attends Religious Services: 1 to 4 times per year    Active Member of [employer] or Organizations: No    Attends Banker Meetings: Never    Marital Status: Married   Outpatient Encounter Medications as of 10/14/2023  Medication Sig   pregabalin (LYRICA) 100 MG capsule Take  100 mg by mouth 3 (three) times daily.   aspirin EC 81 MG tablet Take 81 mg by mouth daily.   [employer] D3 50 MCG (2000 UT) CAPS TAKE 1 CAPSULE (2,000 UNITS TOTAL) BY MOUTH DAILY WITH BREAKFAST.   fluticasone (FLONASE) 50 MCG/ACT nasal spray Place 2 sprays into the nose daily as needed for allergies.   levothyroxine (SYNTHROID) 112 MCG tablet Take 1 tablet (112 mcg total) by mouth daily before breakfast.   loratadine (CLARITIN) 10 MG tablet Take 10 mg by mouth daily as needed for allergies.   meloxicam (MOBIC) 15 MG tablet Take 15 mg by mouth daily.   metFORMIN (GLUCOPHAGE) 500 MG tablet Take 1 tablet (500 mg total) by mouth daily.   Olmesartan-amLODIPine-HCTZ 40-5-25 MG TABS TAKE (1) TABLET BY MOUTH DAILY - NEED FOLLOW UP FOR MORE REFILLS.    pantoprazole (PROTONIX) 40 MG tablet Take 40 mg by mouth daily.   triamcinolone cream (KENALOG) 0.5 % Apply 1 application  topically daily as needed (irritation).   [DISCONTINUED] gabapentin (NEURONTIN) 300 MG capsule Take 300 mg by mouth 3 (three) times daily.   [DISCONTINUED] levothyroxine (SYNTHROID) 112 MCG tablet Take 1 tablet (112 mcg total) by mouth daily before breakfast.   No facility-administered encounter medications on file as of 10/14/2023.   ALLERGIES: Allergies  Allergen Reactions   Sulfonamide Derivatives Hives   VACCINATION STATUS: Immunization History  Administered Date(s) Administered   Influenza,inj,Quad PF,6+ Mos 04/18/2018    HPI -68 year old female patient with medical history as above.       -She is being seen in follow-up for her history of papillary thyroid cancer, postsurgical hypothyroidism, hypocalcemia, prediabetes. - she underwent thyroidectomy for papillary thyroid cancer in February 2014,  and rTSH stimulated RAI thyroid remnant ablation on 09/05/2012.  her pathology revealed multifocal 1.2, and 0.3 cms follicular variant PTC.  Her surveillance neck ultrasound is negative for any residual thyroid tissue x2, last neck sonogram in 2017.  Second surveillance study with rTSH WBS showed a "focus of abnormal tracer accumulation in mid chest at the midline highly suspicious for metastatic disease potentially to a mediastinal lymph node". She was sent for CT of thorax/neck with contrast which she underwent on 03/18/2015. This was reported as unremarkable, might have been artifactual. -Subsequent surveillance study with rTSH stimulation showed absence of the previously noticed abnormal tracer accumulation in the mid chest.  - Her most recent whole-body scan with Thyrogen reveals no evidence of distant iodine avid metastasis in November 2018. -Her surveillance thyroid/neck ultrasound on June 24, 2018 was negative for any residual thyroid tissue or recurrence.     -Her most recent Thyrogen stimulated whole-body scan on September 21, 2019 was negative for evidence of tumor recurrence. -Her  thyroid/neck ultrasound was from February 2022 which was negative for thyroid remnant/recurrence.    - her last thyroid/neck sonogram on 10/23/22 showed post thyroidectomy without evidence of residual or locally recurrent disease.  She is currently on levothyroxine 112 mcg p.o. daily before breakfast.  She reports compliance and consistency with her medication.  Her previsit thyroid function tests are consistent with under replacement.   She denies dysphagia, SOB, nor voice change.  She has no new complaints today. -She continues to feel better.      -She denies any family hx of thyroid dysfunction nor cancer.  -She is on metformin 500 mg p.o. daily at breakfast.  She returns with steady weight, her point-of-care A1c  is stable at 6.2 % .   She is  not monitoring blood glucose regularly.     Review of Systems Limited as above.  Objective:    BP 114/74   Pulse 64   Ht 5\' 6"  (1.676 m)   Wt 212 lb 6.4 oz (96.3 kg)   BMI 34.28 kg/m   Wt Readings from Last 3 Encounters:  10/14/23 212 lb 6.4 oz (96.3 kg)  04/15/23 214 lb 9.6 oz (97.3 kg)  10/08/22 217 lb 12.8 oz (98.8 kg)    complete Blood Count (Most recent): Lab Results  Component Value Date   WBC 6.3 08/21/2012   HGB 10.0 (L) 08/21/2012   HCT 29.9 (L) 08/21/2012   MCV 81.7 08/21/2012   PLT 241 08/21/2012    Recent Results (from the past 2160 hours)  Comprehensive metabolic panel     Status: Abnormal   Collection Time: 10/07/23  9:18 AM  Result Value Ref Range   Glucose 86 70 - 99 mg/dL   BUN 29 (H) 8 - 27 mg/dL   Creatinine, Ser 8.29 0.57 - 1.00 mg/dL   eGFR 62 >56 OZ/HYQ/6.57   BUN/Creatinine Ratio 29 (H) 12 - 28   Sodium 142 134 - 144 mmol/L   Potassium 4.1 3.5 - 5.2 mmol/L   Chloride 103 96 - 106 mmol/L   CO2 25 20 - 29 mmol/L   Calcium 9.6 8.7 - 10.3 mg/dL   Total Protein 6.9 6.0 - 8.5  g/dL   Albumin 4.0 3.9 - 4.9 g/dL   Globulin, Total 2.9 1.5 - 4.5 g/dL   Bilirubin Total 0.3 0.0 - 1.2 mg/dL   Alkaline Phosphatase 82 44 - 121 IU/L   AST 20 0 - 40 IU/L   ALT 11 0 - 32 IU/L  Lipid panel     Status: None   Collection Time: 10/07/23  9:18 AM  Result Value Ref Range   Cholesterol, Total 186 100 - 199 mg/dL   Triglycerides 57 0 - 149 mg/dL   HDL 78 >84 mg/dL   VLDL Cholesterol Cal 11 5 - 40 mg/dL   LDL Chol Calc (NIH) 97 0 - 99 mg/dL   Chol/HDL Ratio 2.4 0.0 - 4.4 ratio    Comment:                                   T. Chol/HDL Ratio                                             Men  Women                               1/2 Avg.Risk  3.4    3.3                                   Avg.Risk  5.0    4.4                                2X Avg.Risk  9.6    7.1  3X Avg.Risk 23.4   11.0   TSH     Status: None   Collection Time: 10/07/23  9:18 AM  Result Value Ref Range   TSH 3.000 0.450 - 4.500 uIU/mL  T4, free     Status: Abnormal   Collection Time: 10/07/23  9:18 AM  Result Value Ref Range   Free T4 1.86 (H) 0.82 - 1.77 ng/dL  VITAMIN D 25 Hydroxy (Vit-D Deficiency, Fractures)     Status: None   Collection Time: 10/07/23  9:18 AM  Result Value Ref Range   Vit D, 25-Hydroxy 49.9 30.0 - 100.0 ng/mL    Comment: Vitamin D deficiency has been defined by the Institute of Medicine and an Endocrine Society practice guideline as a level of serum 25-OH vitamin D less than 20 ng/mL (1,2). The Endocrine Society went on to further define vitamin D insufficiency as a level between 21 and 29 ng/mL (2). 1. IOM (Institute of Medicine). 2010. Dietary reference    intakes for calcium and D. [place]: The    [employer]. 2. Holick MF, Binkley Sunshine, Bischoff-Ferrari HA, et al.    Evaluation, treatment, and prevention of vitamin D    deficiency: an Endocrine Society clinical practice    guideline. JCEM. 2011 Jul; 96(7):1911-30.   Thyroglobulin  antibody     Status: None   Collection Time: 10/07/23  9:18 AM  Result Value Ref Range   Thyroglobulin Antibody <1.0 0.0 - 0.9 IU/mL    Comment: Thyroglobulin Antibody measured by [employer] Methodology It should be noted that the presence of thyroglobulin antibodies may not be pathogenic nor diagnostic, especially at very low levels. The assay manufacturer has found that four percent of individuals without evidence of thyroid disease or autoimmunity will have positive TgAb levels up to 4 IU/mL.   Thyroglobulin Level     Status: None (Preliminary result)   Collection Time: 10/07/23  9:18 AM  Result Value Ref Range   Thyroglobulin (TG-RIA) WILL FOLLOW   HgB A1c     Status: None   Collection Time: 10/14/23  9:57 AM  Result Value Ref Range   Hemoglobin A1C     HbA1c POC (<> result, manual entry)     HbA1c, POC (prediabetic range)     HbA1c, POC (controlled diabetic range) 6.2 0.0 - 7.0 %    Calcium 10.5 above target on August 27, 2018   Thyroid/neck ultrasound on June 24, 2018 No discrete nodules are seen within the thyroidectomy bed.   IMPRESSION: No residual or recurrent tissue post total thyroidectomy.   Thyrogen stimulated whole-body scan on September 21, 2019 FINDINGS: No focal uptake in the thyroid bed. Mild uptake in the mediastinum is less than exam from 06/14/2017. Physiologic uptake noted in the salivary glands and GI tract as well as a GU tract.   IMPRESSION: No scintigraphic thyroid cancer recurrence.  August 19, 2020 thyroid/neck ultrasound IMPRESSION: Post total thyroidectomy without evidence of residual or locally recurrent disease.  Assessment & Plan:   1. Malignant neoplasm of thyroid gland (HCC)  - She has had multifocal FVPTC ( 1.2 and 0.3 cms), stage 1 , with no evidence of lymphovascular invasion. She is s/p total thyroidectomy February 2014. She is s/p rTSH stimulated RAI remnant ablation with post therapy scan which is negative for  distant spread on 09/05/2012. Her stimulated TG level was 1.9 along with <20 Tg Abs.  her surveillance thyroid u/s is negative for any residual thyroid tissue x 1. Her last  neck/ thyroid ultrasound is s/f surgically absent thyroid .  Second surveillance study with rTSH WBS on 02/18/2015 showed a "focus of abnormal tracer accumulation in mid chest at the midline highly suspicious for metastatic disease potentially to a mediastinal lymph node". - CT with contrast of her chest and neck were unremarkable, might have been artifactual.     Her  rTSH stimulated whole-body scan on 12/09/2015 is reported as unremarkable,  Specifically, focus of abnormal uptake seen within the mid chest on the previous exam is no longer identified.  -  thyroglobulin level is 0.1 improving from 1.9 ,  with antithyroglobulin antibodies at <0.1.  -  Her surveillance thyroid/neck ultrasound on 04/03/2016 was unremarkable,  showing surgically absent thyroid. - Her most recent Thyrogen stimulated whole-body scan from 06/14/2017 and thyroglobulin level before this visit where reassuring with no evidence of distant iodine avid metastasis. -Her previsit unstimulated thyroglobulin level is 0.1 along with thyroglobulin antibodies.  June 24, 2018 surveillance thyroid/neck ultrasound: No residual or recurrent tissue post total thyroidectomy.  -Her most recent Thyrogen stimulated whole-body scan on September 21, 2019 was negative for evidence of tumor recurrence.  Her last thyroid/neck ultrasound from February 2022 was consistent with total thyroidectomy without evidence of residual or locally recurrent disease.    - her last thyroid/neck sonogram on 10/23/22 showed post thyroidectomy without evidence of residual or locally recurrent disease. Her pre-visit thyroglobulin levels are pending.  2. Postsurgical hypothyroidism - Post surgical hypothyroidism on suppressive therapy, she will benefit from her current dose of  levothyroxine  t112 mcg p.o. daily before breakfast.     - We discussed about the correct intake of her thyroid hormone, on empty stomach at fasting, with water, separated by at least 30 minutes from breakfast and other medications,  and separated by more than 4 hours from calcium, iron, multivitamins, acid reflux medications (PPIs). -Patient is made aware of the fact that thyroid hormone replacement is needed for life, dose to be adjusted by periodic monitoring of thyroid function tests.    3.  hypertension  -She is advised to be consistent on her blood pressure medications including Tribenzor 40-5-20 5 mg p.o. daily.    4. Pre-diabetes -Her point-of-care A1c is at 6.2%, increasing from 5.8%.  She is advised to continue metformin 500 mg p.o. daily at breakfast.  She is advised to avoid ultra processed food and drinks.  5) vitamin D deficiency:  She is advised to continue vitamin D3 2000 units daily.   6) she has mild hyperlipidemia with LDL of  97 improving from 782.  She is not on statins.  She will have repeat labs before next visit. - I advised patient to maintain close follow up with Assunta Found, MD for primary care needs.   I spent  26  minutes in the care of the patient today including review of labs from Thyroid Function, CMP, and other relevant labs ; imaging/biopsy records (current and previous including abstractions from other facilities); face-to-face time discussing  her lab results and symptoms, medications doses, her options of short and long term treatment based on the latest standards of care / guidelines;   and documenting the encounter.  Sophia Wilson  participated in the discussions, expressed understanding, and voiced agreement with the above plans.  All questions were answered to her satisfaction. she is encouraged to contact clinic should she have any questions or concerns prior to her return visit.   Follow up plan: Return in about 6 months (around  04/14/2024) for F/U with  Pre-visit Labs, A1c -NV.  Marquis Lunch, MD Phone: [phone]  Fax: [phone]  -  This note was partially dictated with voice recognition software. Similar sounding words can be transcribed inadequately or may not  be corrected upon review.  10/14/2023, 3:23 PM

## 2023-10-15 LAB — COMPREHENSIVE METABOLIC PANEL WITH GFR
ALT: 11 IU/L (ref 0–32)
AST: 20 IU/L (ref 0–40)
Albumin: 4 g/dL (ref 3.9–4.9)
Alkaline Phosphatase: 82 IU/L (ref 44–121)
BUN/Creatinine Ratio: 29 — ABNORMAL HIGH (ref 12–28)
BUN: 29 mg/dL — ABNORMAL HIGH (ref 8–27)
Bilirubin Total: 0.3 mg/dL (ref 0.0–1.2)
CO2: 25 mmol/L (ref 20–29)
Calcium: 9.6 mg/dL (ref 8.7–10.3)
Chloride: 103 mmol/L (ref 96–106)
Creatinine, Ser: 1 mg/dL (ref 0.57–1.00)
Globulin, Total: 2.9 g/dL (ref 1.5–4.5)
Glucose: 86 mg/dL (ref 70–99)
Potassium: 4.1 mmol/L (ref 3.5–5.2)
Sodium: 142 mmol/L (ref 134–144)
Total Protein: 6.9 g/dL (ref 6.0–8.5)
eGFR: 62 mL/min/{1.73_m2} (ref >59)

## 2023-10-15 LAB — THYROGLOBULIN LEVEL: Thyroglobulin (TG-RIA): <2 ng/mL

## 2023-10-15 LAB — LIPID PANEL
Chol/HDL Ratio: 2.4 ratio (ref 0.0–4.4)
Cholesterol, Total: 186 mg/dL (ref 100–199)
HDL: 78 mg/dL (ref >39)
LDL Chol Calc (NIH): 97 mg/dL (ref 0–99)
Triglycerides: 57 mg/dL (ref 0–149)
VLDL Cholesterol Cal: 11 mg/dL (ref 5–40)

## 2023-10-15 LAB — THYROGLOBULIN ANTIBODY: Thyroglobulin Antibody: <1 [IU]/mL (ref 0.0–0.9)

## 2023-10-15 LAB — T4, FREE: Free T4: 1.86 ng/dL — ABNORMAL HIGH (ref 0.82–1.77)

## 2023-10-15 LAB — TSH: TSH: 3 u[IU]/mL (ref 0.450–4.500)

## 2023-10-15 LAB — VITAMIN D 25 HYDROXY (VIT D DEFICIENCY, FRACTURES): Vit D, 25-Hydroxy: 49.9 ng/mL (ref 30.0–100.0)

## 2023-12-23 DIAGNOSIS — M5441 Lumbago with sciatica, right side: Secondary | ICD-10-CM | POA: Diagnosis not present

## 2023-12-23 DIAGNOSIS — E6609 Other obesity due to excess calories: Secondary | ICD-10-CM | POA: Diagnosis not present

## 2023-12-23 DIAGNOSIS — Z6831 Body mass index (BMI) 31.0-31.9, adult: Secondary | ICD-10-CM | POA: Diagnosis not present

## 2023-12-31 ENCOUNTER — Telehealth: Payer: Self-pay | Admitting: "Endocrinology

## 2023-12-31 ENCOUNTER — Other Ambulatory Visit: Payer: Self-pay

## 2023-12-31 DIAGNOSIS — E890 Postprocedural hypothyroidism: Secondary | ICD-10-CM

## 2023-12-31 NOTE — Telephone Encounter (Signed)
 Labs updated and sent to [facility].

## 2023-12-31 NOTE — Telephone Encounter (Signed)
 Pt needs labs updated due to Dr. [place] having to be out of the office.

## 2024-01-07 DIAGNOSIS — M5441 Lumbago with sciatica, right side: Secondary | ICD-10-CM | POA: Diagnosis not present

## 2024-01-07 DIAGNOSIS — E6609 Other obesity due to excess calories: Secondary | ICD-10-CM | POA: Diagnosis not present

## 2024-01-07 DIAGNOSIS — Z6831 Body mass index (BMI) 31.0-31.9, adult: Secondary | ICD-10-CM | POA: Diagnosis not present

## 2024-01-08 ENCOUNTER — Telehealth: Payer: Self-pay | Admitting: *Deleted

## 2024-01-08 ENCOUNTER — Encounter: Payer: Self-pay | Admitting: *Deleted

## 2024-01-08 NOTE — Patient Outreach (Signed)
 Complex Care Management   Visit Note  01/13/2024 updated note for 01/08/24  Name:  Sophia Wilson MRN: [id number] DOB: 12-07-1955  Situation: Referral received for Complex Care Management related to care gap assessment I obtained verbal consent from Patient.  Visit completed with Sophia Wilson  on the phone   Sophia Wilson reports she is doing better today She recently had back pain and reports decreased symptoms   Care gaps Will consider, getting pneumonia, DEXA SCAN, & zoster vaccines No preference for Hepatitis vaccine   Updated EPIC with covid vaccine 11/11/18 12/09/19 & Tdap 08/13/2004 06/20/2015  Background:   Past Medical History:  Diagnosis Date   Anemia    Cancer (HCC)    thyroid  cancer   Collagen vascular disease (HCC)    Diabetes mellitus without complication (HCC)    Eczema    Fibroid tumor    GERD (gastroesophageal reflux disease)    H/O seasonal allergies    Herpes simplex without mention of complication    rt thigh   Hypertension    Thyroid  disease    cancer   Vitiligo     Assessment: Patient Reported Symptoms:  Cognitive Cognitive Status: Alert and oriented to person, place, and time, Insightful and able to interpret abstract concepts, Normal speech and language skills Cognitive/Intellectual Conditions Management [RPT]: None reported or documented in medical history or problem list   Health Maintenance Behaviors: Annual physical exam, Healthy diet, Sleep adequate Healing Pattern: Average Health Facilitated by: Healthy diet, Pain control, Rest  Neurological Neurological Review of Symptoms: No symptoms reported    HEENT HEENT Symptoms Reported: No symptoms reported HEENT Management Strategies: Adequate rest, Routine screening HEENT Self-Management Outcome: 4 (good)    Cardiovascular Cardiovascular Symptoms Reported: No symptoms reported Does patient have uncontrolled Hypertension?: Yes Is patient checking Blood Pressure at home?: Yes Patient's  Recent BP reading at home: 122/80 Cardiovascular Management Strategies: Medication therapy, Adequate rest, Routine screening, Medical device Cardiovascular Self-Management Outcome: 4 (good)  Respiratory Respiratory Symptoms Reported: No symptoms reported Respiratory Management Strategies: Adequate rest Respiratory Self-Management Outcome: 4 (good)  Endocrine Endocrine Symptoms Reported: No symptoms reported Is patient diabetic?: Yes Is patient checking blood sugars at home?: Yes Endocrine Self-Management Outcome: 4 (good)  Gastrointestinal Gastrointestinal Symptoms Reported: No symptoms reported Gastrointestinal Management Strategies: Medication therapy, Diet modification, Adequate rest Gastrointestinal Self-Management Outcome: 4 (good)    Genitourinary Genitourinary Symptoms Reported: No symptoms reported Genitourinary Management Strategies: Adequate rest Genitourinary Self-Management Outcome: 4 (good)  Integumentary   Skin Management Strategies: Adequate rest Skin Self-Management Outcome: 4 (good)  Musculoskeletal Musculoskelatal Symptoms Reviewed: Back pain Musculoskeletal Management Strategies: Adequate rest, Medication therapy Musculoskeletal Self-Management Outcome: 3 (uncertain) Falls in the past year?: No Number of falls in past year: 1 or less Was there an injury with Fall?: No Fall Risk Category Calculator: 0 Patient Fall Risk Level: Low Fall Risk Fall risk Follow up: Falls evaluation completed  Psychosocial Psychosocial Symptoms Reported: No symptoms reported Behavioral Management Strategies: Coping strategies Behavioral Health Self-Management Outcome: 4 (good) Major Change/Loss/Stressor/Fears (CP): Medical condition, self Techniques to Cope with Loss/Stress/Change: Diversional activities Quality of Family Relationships: helpful, supportive Do you feel physically threatened by others?: No      01/08/2024    2:26 PM  Depression screen PHQ 2/9  Decreased Interest 0   Down, Depressed, Hopeless 0  PHQ - 2 Score 0    Vitals:   01/08/24 1441  BP: 122/80    Medications Reviewed Today     Reviewed by Sophia Wilson,  Sophia CROME, RN (Registered Nurse) on 01/08/24 at 1424  Med List Status: <None>   Medication Order Taking? Sig Documenting Provider Last Dose Status Informant  aspirin EC 81 MG tablet [id number] Yes Take 81 mg by mouth daily. [provider]  Active Self  CVS D3 50 MCG (2000 UT) CAPS [id number] Yes TAKE 1 CAPSULE (2,000 UNITS TOTAL) BY MOUTH DAILY WITH BREAKFAST. Nida, Gebreselassie W, MD  Active Self  cyclobenzaprine (FLEXERIL) 10 MG tablet [id number] Yes Take 10 mg by mouth 3 (three) times daily. [provider]  Active   fluticasone (FLONASE) 50 MCG/ACT nasal spray [id number] Yes Place 2 sprays into the nose daily as needed for allergies. [provider]  Active Self  levothyroxine  (SYNTHROID ) 112 MCG tablet [id number] Yes Take 1 tablet (112 mcg total) by mouth daily before breakfast. Nida, Gebreselassie W, MD  Active   loratadine (CLARITIN) 10 MG tablet [id number] Yes Take 10 mg by mouth daily as needed for allergies. [provider]  Active Self  meloxicam (MOBIC) 15 MG tablet [id number] Yes Take 15 mg by mouth daily. [provider]  Active Self  metFORMIN  (GLUCOPHAGE ) 500 MG tablet [id number] Yes Take 1 tablet (500 mg total) by mouth daily. Nida, Gebreselassie W, MD  Active   Olmesartan -amLODIPine -HCTZ 40-5-25 MG TABS [id number] Yes TAKE (1) TABLET BY MOUTH DAILY - NEED FOLLOW UP FOR MORE REFILLS. Nida, Gebreselassie W, MD  Active Self  pantoprazole (PROTONIX) 40 MG tablet [id number] Yes Take 40 mg by mouth daily. [provider]  Active Self  predniSONE (DELTASONE) 10 MG tablet [id number] Yes Take by mouth. [provider]  Active   pregabalin (LYRICA) 100 MG capsule [id number] Yes Take 100 mg by mouth 3 (three) times daily. [provider]  Active   triamcinolone cream (KENALOG) 0.5 %  [id number] Yes Apply 1 application  topically daily as needed (irritation). [provider]  Active Self           Med Note Sophia Wilson Apr 02, 2022 12:40 PM)              Recommendation:   PCP Follow-up Continue Current Plan of Care Follow up on obtaining pneumonia, shingles & DEXA scan  Follow Up Plan:   Telephone follow up appointment date/time:  03/09/24 2 pm  Sophia L. Ramonita, RN, BSN, CCM [place]  [facility] RN Care Manager Direct Dial: [phone]  Fax: [phone]

## 2024-01-13 ENCOUNTER — Other Ambulatory Visit: Payer: Self-pay

## 2024-01-13 ENCOUNTER — Encounter: Payer: Self-pay | Admitting: *Deleted

## 2024-01-13 NOTE — Patient Instructions (Addendum)
 Visit Information  Thank you for taking time to visit with me today. Please don't hesitate to contact me if I can be of assistance to you before our next scheduled appointment.  Our next appointment is by telephone on 03/09/24 at 2 pm Please call the care guide team at [phone] if you need to cancel or reschedule your appointment.   Following is a copy of your care plan:   Goals Addressed             This Visit's Progress    Care gap closures VBCI RN Care Plan   No change    Problems:  Knowledge Deficits related to immunizations/care gaps  Goal: Over the next 90 days the Patient will work with her pcp staff to obtain needed vaccines as evidenced by review of electronic medical record and patient or care team member report    Interventions:   Health Maintenance Interventions: Patient interviewed about adult health maintenance status including  TDAP Vaccine    Zostavax Pneumonia Vaccine Influenza Vaccine COVID vaccination    Regular eye checkups Regular Dental Care    Diabetes Eye Exam    Hemoglobin A1c    Advised to discuss  Zostavax Pneumonia Vaccine DEXA scan with primary care provider  Notified provider of adult immunization status and request for consideration of order for/scheduling of Pneumonia Vaccine Provided education about  especially Bone density/DEXA scan  Patient Self-Care Activities:  Attend all scheduled provider appointments Call provider office for new concerns or questions  Take medications as prescribed    Plan:  Telephone follow up appointment with care management team member scheduled for:  03/09/24 2 pm             Please call the Suicide and Crisis Lifeline: 988 call the [place]  National Suicide Prevention Lifeline: 6122115706 or TTY: 432-332-1822 TTY 734-202-6746) to talk to a trained counselor call 1-800-273-TALK (toll free, 24 hour hotline) call the [facility]: 207-424-4955 call 911 if you are experiencing a  Mental Health or Behavioral Health Crisis or need someone to talk to.  Patient verbalizes understanding of instructions and care plan provided today and agrees to view in MyChart. Active MyChart status and patient understanding of how to access instructions and care plan via MyChart confirmed with patient.      Sophia Gehret L. Ramonita, RN, BSN, CCM [place]  [facility], [facility] Health RN Care Manager Direct Dial: [phone]  Fax: [phone]

## 2024-02-12 ENCOUNTER — Other Ambulatory Visit (HOSPITAL_COMMUNITY): Payer: Self-pay | Admitting: Otolaryngology

## 2024-02-12 DIAGNOSIS — M5459 Other low back pain: Secondary | ICD-10-CM | POA: Diagnosis not present

## 2024-02-12 DIAGNOSIS — M545 Low back pain, unspecified: Secondary | ICD-10-CM | POA: Insufficient documentation

## 2024-02-20 ENCOUNTER — Ambulatory Visit (HOSPITAL_COMMUNITY)
Admission: RE | Admit: 2024-02-20 | Discharge: 2024-02-20 | Disposition: A | Discharge status: Home / Self Care | Source: Ambulatory Visit | Attending: Otolaryngology | Admitting: Otolaryngology

## 2024-02-20 DIAGNOSIS — M5459 Other low back pain: Secondary | ICD-10-CM | POA: Insufficient documentation

## 2024-02-20 DIAGNOSIS — M545 Low back pain, unspecified: Secondary | ICD-10-CM

## 2024-03-02 DIAGNOSIS — M4807 Spinal stenosis, lumbosacral region: Secondary | ICD-10-CM | POA: Diagnosis not present

## 2024-03-09 ENCOUNTER — Encounter: Payer: Self-pay | Admitting: *Deleted

## 2024-03-09 ENCOUNTER — Telehealth: Payer: Self-pay | Admitting: *Deleted

## 2024-03-09 DIAGNOSIS — M5416 Radiculopathy, lumbar region: Secondary | ICD-10-CM | POA: Insufficient documentation

## 2024-03-09 NOTE — Patient Instructions (Signed)
 Erminio KANDICE Metro - I am sorry I was unable to reach you today for our scheduled appointment. I work with Marvine Rush, MD and am calling to support your healthcare needs. Please contact me at [phone] at your earliest convenience. I look forward to speaking with you soon.   Thank you,  Suzen L. Ramonita, RN, BSN, CCM [place]  [facility], [facility] Health RN Care Manager Direct Dial: [phone]  Fax: [phone]

## 2024-03-10 ENCOUNTER — Telehealth: Payer: Self-pay

## 2024-03-10 NOTE — Progress Notes (Signed)
 Complex Care Management Care Guide Note  03/10/2024 Name: SARHA BARTELT MRN: [id number] DOB: [birth year]  CHARLIENE INOUE is a 68 y.o. year old female who is a primary care patient of Marvine Rush, MD and is actively engaged with the care management team. I reached out to Erminio KANDICE Metro by phone today to assist with re-scheduling  with the RN Case Manager.  Follow up plan: Unsuccessful telephone outreach attempt made. A HIPAA compliant phone message was left for the patient providing contact information and requesting a return call.  Leotis Rase [facility], [facility] Guide  Direct Dial: [phone]  Fax [phone]

## 2024-03-11 DIAGNOSIS — M5416 Radiculopathy, lumbar region: Secondary | ICD-10-CM | POA: Diagnosis not present

## 2024-03-20 ENCOUNTER — Encounter: Payer: Self-pay | Admitting: *Deleted

## 2024-03-20 ENCOUNTER — Other Ambulatory Visit: Payer: Self-pay | Admitting: *Deleted

## 2024-03-20 ENCOUNTER — Other Ambulatory Visit: Payer: Self-pay

## 2024-03-20 NOTE — Patient Outreach (Signed)
 Complex Care Management   Visit Note  03/20/2024  Name:  Sophia Wilson MRN: [id number] DOB: 21-Aug-1955  Situation: Referral received for Complex Care Management related to care gap assessment I obtained verbal consent from Patient.  Visit completed with Patient  on the phone  She reports she is doing well except continue to have back pain that runs down her legs and causes her knee to buckle at times. She is scheduled to see Dr Louann in [facility] 03/27/24+ last [facility] follow up on 03/25/24  She has not decided on her new pcp office after [facility] closes on 03/27/24.  May stay with Dr Marvine until after her back pain concerns are resolved She voiced understanding of the in network providers listed on her blue cross blue shield plan. She wrote down information for [place] primary & family medicine offices + the [facility] office that Dr Marvine and GORMAN Mace will be relocating to. She was very appreciative of the updates on the closing of [facility], the in network pcp options, addresses and phone numbers for pending pcp offices She agreed to a follow up call to review back pain treatment and new pcp change  Background:   Past Medical History:  Diagnosis Date   Anemia    Cancer (HCC)    thyroid  cancer   Collagen vascular disease (HCC)    Diabetes mellitus without complication (HCC)    Eczema    Fibroid tumor    GERD (gastroesophageal reflux disease)    H/O seasonal allergies    Herpes simplex without mention of complication    rt thigh   Hypertension    Thyroid  disease    cancer   Vitiligo     Assessment: Patient Reported Symptoms:  Cognitive Cognitive Status: Alert and oriented to person, place, and time, No symptoms reported      Neurological Neurological Review of Symptoms: Weakness, Other: [place] Neurological Symptoms/Conditions [RPT]: knees give out related to pain from back radiating down leg. Gabapentin stopped not working Neurological Management  Strategies: Adequate rest, Routine screening Neurological Self-Management Outcome: 3 (uncertain)  HEENT HEENT Symptoms Reported: No symptoms reported      Cardiovascular Cardiovascular Symptoms Reported: No symptoms reported Cardiovascular Self-Management Outcome: 4 (good)  Respiratory Respiratory Symptoms Reported: No symptoms reported Respiratory Self-Management Outcome: 4 (good)  Endocrine Endocrine Symptoms Reported: No symptoms reported Endocrine Self-Management Outcome: 4 (good)  Gastrointestinal Gastrointestinal Symptoms Reported: No symptoms reported Gastrointestinal Self-Management Outcome: 4 (good)    Genitourinary Genitourinary Symptoms Reported: No symptoms reported Genitourinary Self-Management Outcome: 4 (good)  Integumentary Integumentary Symptoms Reported: No symptoms reported Skin Self-Management Outcome: 4 (good)  Musculoskeletal Musculoskelatal Symptoms Reviewed: Back pain, Weakness, Other Other Musculoskeletal Symptoms: knees buckle taking gabapentin but stopped as she reports it was not helping/working Musculoskeletal Management Strategies: Adequate rest, Routine screening, Coping strategies Musculoskeletal Self-Management Outcome: 3 (uncertain) Falls in the past year?: No Number of falls in past year: 1 or less Was there an injury with Fall?: No Fall Risk Category Calculator: 0 Patient Fall Risk Level: Low Fall Risk Patient at Risk for Falls Due to: Impaired balance/gait Fall risk Follow up: Falls evaluation completed  Psychosocial Psychosocial Symptoms Reported: No symptoms reported Behavioral Health Self-Management Outcome: 4 (good)        03/20/2024    PHQ2-9 Depression Screening   Little interest or pleasure in doing things Not at all  Feeling down, depressed, or hopeless Not at all  PHQ-2 - Total Score 0  Trouble falling or staying asleep,  or sleeping too much    Feeling tired or having little energy    Poor appetite or overeating     Feeling bad  about yourself - or that you are a failure or have let yourself or your family down    Trouble concentrating on things, such as reading the newspaper or watching television    Moving or speaking so slowly that other people could have noticed.  Or the opposite - being so fidgety or restless that you have been moving around a lot more than usual    Thoughts that you would be better off dead, or hurting yourself in some way    PHQ2-9 Total Score    If you checked off any problems, how difficult have these problems made it for you to do your work, take care of things at home, or get along with other people    Depression Interventions/Treatment      There were no vitals filed for this visit.  Medications Reviewed Today   Medications were not reviewed in this encounter     Recommendation:   03/25/24 1200 PCP Follow-up Specialty provider follow-up ortho 03/27/24  Continue Current Plan of Care  Follow Up Plan:   Telephone follow up appointment date/time:  03/30/24 1030  Dominyck Reser L. Ramonita, RN, BSN, CCM [place]  [facility], [facility] Health RN Care Manager Direct Dial: [phone]  Fax: [phone]

## 2024-03-20 NOTE — Patient Instructions (Signed)
 Visit Information  Thank you for taking time to visit with me today. Please don't hesitate to contact me if I can be of assistance to you before our next scheduled appointment.  Your next care management appointment is by telephone on 03/30/24 at 1030  Today we discussed future pcp services, back pain/sciatica, care gaps/vaccines  Please call the care guide team at [phone] if you need to cancel, schedule, or reschedule an appointment.   Please call the Suicide and Crisis Lifeline: 988 call the [place]  National Suicide Prevention Lifeline: (580)742-9344 or TTY: (318)390-4536 TTY 607-512-3065) to talk to a trained counselor call 1-800-273-TALK (toll free, 24 hour hotline) call the [facility]: 681-063-1266 call 911 if you are experiencing a Mental Health or Behavioral Health Crisis or need someone to talk to.  Hilmer Aliberti L. Ramonita, RN, BSN, CCM [place]  [facility], [facility] Health RN Care Manager Direct Dial: [phone]  Fax: [phone]

## 2024-03-25 DIAGNOSIS — E1140 Type 2 diabetes mellitus with diabetic neuropathy, unspecified: Secondary | ICD-10-CM | POA: Diagnosis not present

## 2024-03-25 DIAGNOSIS — E559 Vitamin D deficiency, unspecified: Secondary | ICD-10-CM | POA: Diagnosis not present

## 2024-03-25 DIAGNOSIS — R609 Edema, unspecified: Secondary | ICD-10-CM | POA: Diagnosis not present

## 2024-03-25 DIAGNOSIS — Z6828 Body mass index (BMI) 28.0-28.9, adult: Secondary | ICD-10-CM | POA: Diagnosis not present

## 2024-03-25 DIAGNOSIS — E890 Postprocedural hypothyroidism: Secondary | ICD-10-CM | POA: Diagnosis not present

## 2024-03-25 DIAGNOSIS — I10 Essential (primary) hypertension: Secondary | ICD-10-CM | POA: Diagnosis not present

## 2024-03-25 DIAGNOSIS — E538 Deficiency of other specified B group vitamins: Secondary | ICD-10-CM | POA: Diagnosis not present

## 2024-03-25 DIAGNOSIS — E1159 Type 2 diabetes mellitus with other circulatory complications: Secondary | ICD-10-CM | POA: Diagnosis not present

## 2024-03-30 ENCOUNTER — Other Ambulatory Visit: Payer: Self-pay | Admitting: *Deleted

## 2024-03-30 ENCOUNTER — Other Ambulatory Visit: Payer: Self-pay

## 2024-03-30 DIAGNOSIS — M545 Low back pain, unspecified: Secondary | ICD-10-CM | POA: Diagnosis not present

## 2024-03-30 NOTE — Patient Outreach (Signed)
 Complex Care Management   Visit Note  03/30/2024  Name:  Sophia Wilson MRN: [id number] DOB: January 01, 1956  Situation: Referral received for Complex Care Management related to care gap assessment, back pain, pcp services  I obtained verbal consent from Patient.  Visit completed with Patient  on the phone  She reports she is doing well except continue to have back pain that runs down her legs and causes her knee to buckle at times. She is scheduled to see Sophia Wilson in [facility] 03/27/24+ last [facility] medical follow up on 03/25/24   She has not decided on her new pcp office after Sophia Wilson closes on 03/27/24.  May stay with Sophia Wilson until after her back pain concerns are resolved She voiced understanding of the in network providers listed on her blue cross blue shield plan. She wrote down information for [place] primary & family medicine offices + the [facility] office that Sophia Wilson will be relocating to. She was very appreciative of the updates on the closing of [facility], the in network pcp options, addresses and phone numbers for pending pcp offices She agreed to a follow up call to review back pain treatment and new pcp change  Background:   Past Medical History:  Diagnosis Date   Anemia    Cancer (HCC)    thyroid  cancer   Collagen vascular disease (HCC)    Diabetes mellitus without complication (HCC)    Eczema    Fibroid tumor    GERD (gastroesophageal reflux disease)    H/O seasonal allergies    Herpes simplex without mention of complication    rt thigh   Hypertension    Thyroid  disease    cancer   Vitiligo     Assessment: Patient Reported Symptoms:  Cognitive Cognitive Status: No symptoms reported, Alert and oriented to person, place, and time Cognitive/Intellectual Conditions Management [RPT]: None reported or documented in medical history or problem list      Neurological Neurological Review of Symptoms: No symptoms reported Neurological Management  Strategies: Routine screening, Medication therapy Neurological Self-Management Outcome: 4 (good)  HEENT HEENT Symptoms Reported: No symptoms reported HEENT Management Strategies: Routine screening HEENT Self-Management Outcome: 4 (good)    Cardiovascular Cardiovascular Symptoms Reported: No symptoms reported Cardiovascular Management Strategies: Medication therapy, Routine screening Cardiovascular Self-Management Outcome: 4 (good)  Respiratory Respiratory Symptoms Reported: No symptoms reported Respiratory Management Strategies: Routine screening Respiratory Self-Management Outcome: 4 (good)  Endocrine Endocrine Symptoms Reported: No symptoms reported Endocrine Self-Management Outcome: 4 (good)  Gastrointestinal Gastrointestinal Symptoms Reported: No symptoms reported Gastrointestinal Self-Management Outcome: 4 (good)    Genitourinary Genitourinary Symptoms Reported: No symptoms reported Genitourinary Self-Management Outcome: 4 (good)  Integumentary Integumentary Symptoms Reported: No symptoms reported Skin Management Strategies: Routine screening Skin Self-Management Outcome: 4 (good)  Musculoskeletal Musculoskelatal Symptoms Reviewed: Back pain Musculoskeletal Management Strategies: Medication therapy, Routine screening Musculoskeletal Self-Management Outcome: 4 (good)      Psychosocial Psychosocial Symptoms Reported: No symptoms reported Behavioral Management Strategies: Support system Behavioral Health Self-Management Outcome: 4 (good)        03/30/2024    PHQ2-9 Depression Screening   Little interest or pleasure in doing things Not at all  Feeling down, depressed, or hopeless Not at all  PHQ-2 - Total Score 0  Trouble falling or staying asleep, or sleeping too much    Feeling tired or having little energy    Poor appetite or overeating     Feeling bad about yourself - or that you are a  failure or have let yourself or your family down    Trouble concentrating on things,  such as reading the newspaper or watching television    Moving or speaking so slowly that other people could have noticed.  Or the opposite - being so fidgety or restless that you have been moving around a lot more than usual    Thoughts that you would be better off dead, or hurting yourself in some way    PHQ2-9 Total Score    If you checked off any problems, how difficult have these problems made it for you to do your work, take care of things at home, or get along with other people    Depression Interventions/Treatment      There were no vitals filed for this visit.  Medications Reviewed Today   Medications were not reviewed in this encounter     Recommendation:   Willl remain under Sophia Bertha care at [place] at [facility]   Follow Up Plan:   Telephone follow up appointment date/time:  Pending outreach 100010/17/25 from [facility] at [facility] RN CM, Sophia Sophia Iha L. Ramonita, RN, BSN, CCM [place]  [facility] Institute, [facility] Health RN Care Manager Direct Dial: [phone]  Fax: [phone]

## 2024-03-30 NOTE — Patient Instructions (Signed)
 Visit Information  Thank you for taking time to visit with me today. Please don't hesitate to contact me if I can be of assistance to you before our next scheduled appointment.  Your next care management appointment is by telephone on 05/01/24 at 1000 By [address]   Please call the care guide team at [phone] if you need to cancel, schedule, or reschedule an appointment.  Please read the back exercises provided connected with this note- in AVS  Please call the Suicide and Crisis Lifeline: 988 call the [place]  National Suicide Prevention Lifeline: 310-344-5743 or TTY: 774 789 4747 TTY 307 805 7857) to talk to a trained counselor call 1-800-273-TALK (toll free, 24 hour hotline) call 911 if you are experiencing a Mental Health or Behavioral Health Crisis or need someone to talk to.  Cade Olberding L. Ramonita, RN, BSN, CCM [place]  [facility], [facility] Health RN Care Manager Direct Dial: [phone]  Fax: [phone]

## 2024-04-01 DIAGNOSIS — M545 Low back pain, unspecified: Secondary | ICD-10-CM | POA: Diagnosis not present

## 2024-04-08 DIAGNOSIS — M545 Low back pain, unspecified: Secondary | ICD-10-CM | POA: Diagnosis not present

## 2024-04-10 DIAGNOSIS — M545 Low back pain, unspecified: Secondary | ICD-10-CM | POA: Diagnosis not present

## 2024-04-13 DIAGNOSIS — M545 Low back pain, unspecified: Secondary | ICD-10-CM | POA: Diagnosis not present

## 2024-04-15 DIAGNOSIS — M545 Low back pain, unspecified: Secondary | ICD-10-CM | POA: Diagnosis not present

## 2024-04-17 ENCOUNTER — Other Ambulatory Visit: Payer: Self-pay | Admitting: "Endocrinology

## 2024-04-20 ENCOUNTER — Ambulatory Visit: Admitting: "Endocrinology

## 2024-04-21 DIAGNOSIS — C73 Malignant neoplasm of thyroid gland: Secondary | ICD-10-CM | POA: Diagnosis not present

## 2024-04-21 DIAGNOSIS — E890 Postprocedural hypothyroidism: Secondary | ICD-10-CM | POA: Diagnosis not present

## 2024-04-22 DIAGNOSIS — M545 Low back pain, unspecified: Secondary | ICD-10-CM | POA: Diagnosis not present

## 2024-04-27 ENCOUNTER — Encounter: Payer: Self-pay | Admitting: "Endocrinology

## 2024-04-27 ENCOUNTER — Ambulatory Visit: Admitting: "Endocrinology

## 2024-04-27 VITALS — BP 136/86 | HR 84 | Ht 66.0 in | Wt 189.2 lb

## 2024-04-27 DIAGNOSIS — R7303 Prediabetes: Secondary | ICD-10-CM | POA: Diagnosis not present

## 2024-04-27 DIAGNOSIS — E782 Mixed hyperlipidemia: Secondary | ICD-10-CM

## 2024-04-27 DIAGNOSIS — E559 Vitamin D deficiency, unspecified: Secondary | ICD-10-CM | POA: Diagnosis not present

## 2024-04-27 DIAGNOSIS — C73 Malignant neoplasm of thyroid gland: Secondary | ICD-10-CM | POA: Diagnosis not present

## 2024-04-27 DIAGNOSIS — E890 Postprocedural hypothyroidism: Secondary | ICD-10-CM

## 2024-04-27 LAB — POCT GLYCOSYLATED HEMOGLOBIN (HGB A1C): HbA1c, POC (controlled diabetic range): 6.2 % (ref 0.0–7.0)

## 2024-04-27 MED ORDER — LEVOTHYROXINE SODIUM 112 MCG PO TABS: 112.0000 ug | ORAL_TABLET | Freq: Every day | ORAL | 1 refills | Status: AC

## 2024-04-27 NOTE — Progress Notes (Signed)
 04/27/2024       Endocrinology follow-up note  Subjective:    Patient ID: Sophia Wilson, female    DOB: 1955-09-10, PCP Marvine Rush, MD   Past Medical History:  Diagnosis Date   Anemia    Cancer [facility])    thyroid  cancer   Collagen vascular disease    Diabetes mellitus without complication (HCC)    Eczema    Fibroid tumor    GERD (gastroesophageal reflux disease)    H/O seasonal allergies    Herpes simplex without mention of complication    rt thigh   Hypertension    Thyroid  disease    cancer   Vitiligo    Past Surgical History:  Procedure Laterality Date   ABDOMINAL HYSTERECTOMY     BIOPSY  04/10/2022   Procedure: BIOPSY;  Surgeon: Cindie Carlin POUR, DO;  Location: [facility];  Service: Endoscopy;;   COLONOSCOPY  03/10/2012   Surgeon: Margo LITTIE Haddock, MD;  moderate size internal hemorrhoids, otherwise normal exam with recommendations to repeat in 10 years.   COLONOSCOPY WITH PROPOFOL  N/A 04/10/2022   internal hemorrhoids.   ESOPHAGOGASTRODUODENOSCOPY (EGD) WITH PROPOFOL  N/A 04/10/2022   gastritis (negative H.pylori), normal duodenum.   THYROIDECTOMY  08/20/2012   Procedure: THYROIDECTOMY;  Surgeon: Oneil DELENA Budge, MD;  Location: [facility] ORS;  Service: General;  Laterality: N/A;  Total Thyroidectomy   Social History   Socioeconomic History   Marital status: Married    Spouse name: Federico   Number of children: 1   Years of education: Not on file   Highest education level: Not on file  Occupational History   Not on file  Tobacco Use   Smoking status: Never   Smokeless tobacco: Never  Vaping Use   Vaping status: Never Used  Substance and Sexual Activity   Alcohol use: No   Drug use: No   Sexual activity: Yes    Birth control/protection: Surgical    Comment: hysterectomy  Other Topics Concern   Not on file  Social History Narrative   Not on file   Social Drivers of Health   Financial Resource Strain: Low Risk  (03/30/2024)   Overall Financial Resource  Strain (CARDIA)    Difficulty of Paying Living Expenses: Not hard at all  Food Insecurity: No Food Insecurity (03/30/2024)   Hunger Vital Sign    Worried About Running Out of Food in the Last Year: Never true    Ran Out of Food in the Last Year: Never true  Transportation Needs: No Transportation Needs (03/30/2024)   PRAPARE - Administrator, Civil Service (Medical): No    Lack of Transportation (Non-Medical): No  Physical Activity: Inactive (03/20/2024)   Exercise Vital Sign    Days of Exercise per Week: 0 days    Minutes of Exercise per Session: 0 min  Stress: No Stress Concern Present (03/30/2024)   [employer] of Occupational Health - Occupational Stress Questionnaire    Feeling of Stress: Only a little  Social Connections: Socially Integrated (03/30/2024)   Social Connection and Isolation Panel    Frequency of Communication with Friends and Family: Twice a week    Frequency of Social Gatherings with Friends and Family: Once a week    Attends Religious Services: 1 to 4 times per year    Active Member of [employer] or Organizations: Yes    Attends Banker Meetings: Never    Marital Status: Married   Outpatient Encounter Medications as of 04/27/2024  Medication Sig   aspirin EC 81 MG tablet Take 81 mg by mouth daily.   [employer] D3 50 MCG (2000 UT) CAPS TAKE 1 CAPSULE (2,000 UNITS TOTAL) BY MOUTH DAILY WITH BREAKFAST.   cyclobenzaprine (FLEXERIL) 10 MG tablet Take 10 mg by mouth 3 (three) times daily. (Patient not taking: Reported on 04/27/2024)   fluticasone (FLONASE) 50 MCG/ACT nasal spray Place 2 sprays into the nose daily as needed for allergies.   gabapentin (NEURONTIN) 300 MG capsule Take 300 mg by mouth 3 (three) times daily. (Patient not taking: Reported on 03/20/2024)   levothyroxine  (SYNTHROID ) 112 MCG tablet Take 1 tablet (112 mcg total) by mouth daily.   loratadine (CLARITIN) 10 MG tablet Take 10 mg by mouth daily as needed for allergies.   meloxicam  (MOBIC) 15 MG tablet Take 15 mg by mouth daily.   metFORMIN  (GLUCOPHAGE ) 500 MG tablet Take 1 tablet (500 mg total) by mouth daily.   Olmesartan -amLODIPine -HCTZ 40-5-25 MG TABS TAKE (1) TABLET BY MOUTH DAILY - NEED FOLLOW UP FOR MORE REFILLS.   pantoprazole (PROTONIX) 40 MG tablet Take 40 mg by mouth daily.   predniSONE (DELTASONE) 10 MG tablet Take by mouth. (Patient not taking: Reported on 04/27/2024)   pregabalin (LYRICA) 100 MG capsule Take 100 mg by mouth 3 (three) times daily. (Patient not taking: Reported on 04/27/2024)   triamcinolone cream (KENALOG) 0.5 % Apply 1 application  topically daily as needed (irritation).   [DISCONTINUED] levothyroxine  (SYNTHROID ) 112 MCG tablet Take 1 tablet (112 mcg total) by mouth daily before breakfast.   [DISCONTINUED] levothyroxine  (SYNTHROID ) 125 MCG tablet Take 125 mcg by mouth daily.   No facility-administered encounter medications on file as of 04/27/2024.   ALLERGIES: Allergies  Allergen Reactions   Misc. Sulfonamide Containing Compounds Dermatitis   Sulfonamide Derivatives Hives   VACCINATION STATUS: Immunization History  Administered Date(s) Administered   Influenza, Seasonal, Injecte, Preservative Fre 06/03/2023   Influenza,inj,Quad PF,6+ Mos 04/18/2018   Influenza,inj,quad, With Preservative 04/22/2019   Tdap 08/13/2004, 06/20/2015   Unspecified SARS-COV-2 Vaccination 11/11/2019, 12/09/2019    HPI -68 year old female patient with medical history as above.       -She is being seen in follow-up for her history of papillary thyroid  cancer, postsurgical hypothyroidism, hypocalcemia, prediabetes. - she underwent thyroidectomy for papillary thyroid  cancer in February 2014,  and rTSH stimulated RAI thyroid  remnant ablation on 09/05/2012.  her pathology revealed multifocal 1.2, and 0.3 cms follicular variant PTC.  Her surveillance neck ultrasound is negative for any residual thyroid  tissue x2, last neck sonogram in 2017.  Second  surveillance study with rTSH WBS showed a focus of abnormal tracer accumulation in mid chest at the midline highly suspicious for metastatic disease potentially to a mediastinal lymph node. She was sent for CT of thorax/neck with contrast which she underwent on 03/18/2015. This was reported as unremarkable, might have been artifactual. -Subsequent surveillance study with rTSH stimulation showed absence of the previously noticed abnormal tracer accumulation in the mid chest.  - Her most recent whole-body scan with Thyrogen  reveals no evidence of distant iodine avid metastasis in November 2018. -Her surveillance thyroid /neck ultrasound on June 24, 2018 was negative for any residual thyroid  tissue or recurrence.    -Her most recent Thyrogen  stimulated whole-body scan on September 21, 2019 was negative for evidence of tumor recurrence. -Her  thyroid /neck ultrasound was from February 2022 which was negative for thyroid  remnant/recurrence.    - her last thyroid /neck sonogram on 10/23/22 showed post thyroidectomy without evidence of  residual or locally recurrent disease.  In the interval, her levothyroxine  was increased to 125 mcg by her PMD.  Her previsit labs are consistent with over replacement.  She presents with unintentional 20+ pounds of weight loss .  She denies dysphagia, SOB, nor voice change.  She has no new complaints today. -She continues to feel better.      -She denies any family hx of thyroid  dysfunction nor cancer.  -She is on metformin  500 mg p.o. daily at breakfast.  Her point-of-care A1c is stable at 6.2%.     She is not monitoring blood glucose regularly.     Review of Systems Limited as above.  Objective:    BP 136/86 Comment: pt states she has not taken her medication today  Pulse 84   Ht 5' 6 (1.676 m)   Wt 189 lb 3.2 oz (85.8 kg)   BMI 30.54 kg/m   Wt Readings from Last 3 Encounters:  04/27/24 189 lb 3.2 oz (85.8 kg)  10/14/23 212 lb 6.4 oz (96.3 kg)  04/15/23  214 lb 9.6 oz (97.3 kg)    complete Blood Count (Most recent): Lab Results  Component Value Date   WBC 6.3 08/21/2012   HGB 10.0 (L) 08/21/2012   HCT 29.9 (L) 08/21/2012   MCV 81.7 08/21/2012   PLT 241 08/21/2012    Recent Results (from the past 2160 hours)  TSH     Status: None   Collection Time: 04/21/24  9:13 AM  Result Value Ref Range   TSH 1.090 0.450 - 4.500 uIU/mL  T4, free     Status: Abnormal   Collection Time: 04/21/24  9:13 AM  Result Value Ref Range   Free T4 2.00 (H) 0.82 - 1.77 ng/dL  Thyroglobulin antibody     Status: None   Collection Time: 04/21/24  9:13 AM  Result Value Ref Range   Thyroglobulin Antibody <1.0 0.0 - 0.9 IU/mL    Comment: Thyroglobulin Antibody measured by [employer] Methodology It should be noted that the presence of thyroglobulin antibodies may not be pathogenic nor diagnostic, especially at very low levels. The assay manufacturer has found that four percent of individuals without evidence of thyroid  disease or autoimmunity will have positive TgAb levels up to 4 IU/mL.   Thyroglobulin Level     Status: None (Preliminary result)   Collection Time: 04/21/24  9:13 AM  Result Value Ref Range   Thyroglobulin (TG-RIA) WILL FOLLOW   HgB A1c     Status: None   Collection Time: 04/27/24 10:31 AM  Result Value Ref Range   Hemoglobin A1C     HbA1c POC (<> result, manual entry)     HbA1c, POC (prediabetic range)     HbA1c, POC (controlled diabetic range) 6.2 0.0 - 7.0 %    Lipid Panel     Component Value Date/Time   CHOL 186 10/07/2023 0918   TRIG 57 10/07/2023 0918   HDL 78 10/07/2023 0918   CHOLHDL 2.4 10/07/2023 0918   CHOLHDL 2.5 06/11/2016 0930   VLDL 13 06/11/2016 0930   LDLCALC 97 10/07/2023 0918   LABVLDL 11 10/07/2023 0918    Calcium  10.5 above target on August 27, 2018   Thyroid /neck ultrasound on June 24, 2018 No discrete nodules are seen within the thyroidectomy bed.   IMPRESSION: No residual or recurrent  tissue post total thyroidectomy.   Thyrogen  stimulated whole-body scan on September 21, 2019 FINDINGS: No focal uptake in the thyroid  bed. Mild uptake in the  mediastinum is less than exam from 06/14/2017. Physiologic uptake noted in the salivary glands and GI tract as well as a GU tract.   IMPRESSION: No scintigraphic thyroid  cancer recurrence.  August 19, 2020 thyroid /neck ultrasound IMPRESSION: Post total thyroidectomy without evidence of residual or locally recurrent disease.  Assessment & Plan:   1. Malignant neoplasm of thyroid  gland (HCC)  - She has had multifocal FVPTC ( 1.2 and 0.3 cms), stage 1 , with no evidence of lymphovascular invasion. She is s/p total thyroidectomy February 2014. She is s/p rTSH stimulated RAI remnant ablation with post therapy scan which is negative for distant spread on 09/05/2012. Her stimulated TG level was 1.9 along with <20 Tg Abs.  her surveillance thyroid  u/s is negative for any residual thyroid  tissue x 1. Her last neck/ thyroid  ultrasound is s/f surgically absent thyroid  .  Second surveillance study with rTSH WBS on 02/18/2015 showed a focus of abnormal tracer accumulation in mid chest at the midline highly suspicious for metastatic disease potentially to a mediastinal lymph node. - CT with contrast of her chest and neck were unremarkable, might have been artifactual.     Her  rTSH stimulated whole-body scan on 12/09/2015 is reported as unremarkable,  Specifically, focus of abnormal uptake seen within the mid chest on the previous exam is no longer identified.  -  thyroglobulin level is 0.1 improving from 1.9 ,  with antithyroglobulin antibodies at <0.1.  -  Her surveillance thyroid /neck ultrasound on 04/03/2016 was unremarkable,  showing surgically absent thyroid . - Her most recent Thyrogen  stimulated whole-body scan from 06/14/2017 and thyroglobulin level before this visit where reassuring with no evidence of distant iodine avid  metastasis. -Her previsit unstimulated thyroglobulin level is 0.1 along with thyroglobulin antibodies.  June 24, 2018 surveillance thyroid /neck ultrasound: No residual or recurrent tissue post total thyroidectomy.  -Her most recent Thyrogen  stimulated whole-body scan on September 21, 2019 was negative for evidence of tumor recurrence.  Her last thyroid /neck ultrasound from February 2022 was consistent with total thyroidectomy without evidence of residual or locally recurrent disease.    - her last thyroid /neck sonogram on 10/23/22 showed post thyroidectomy without evidence of residual or locally recurrent disease. Her pre-visit thyroglobulin levels are pending.  2. Postsurgical hypothyroidism - Her previsit thyroid  function test are consistent with slight over replacement.  I discussed and lowered her levothyroxine  to 112 mcg p.o. daily before breakfast.      - We discussed about the correct intake of her thyroid  hormone, on empty stomach at fasting, with water , separated by at least 30 minutes from breakfast and other medications,  and separated by more than 4 hours from calcium , iron, multivitamins, acid reflux medications (PPIs). -Patient is made aware of the fact that thyroid  hormone replacement is needed for life, dose to be adjusted by periodic monitoring of thyroid  function tests.   3.  hypertension  -She is advised to be consistent on her blood pressure medications including Tribenzor 40-5-20 5 mg p.o. daily.    4. Pre-diabetes -Her point-of-care A1c is stable at 6.2%.  She is advised to continue metformin  500 mg p.o. daily at breakfast.   She is advised to avoid ultra processed food and drinks.  5) vitamin D  deficiency:  She is advised to continue vitamin D3 2000 units daily.   6) she has mild hyperlipidemia with LDL of  97 improving from 893.  She is not on statins.  She will have repeat labs before next visit.  - I advised patient  to maintain close follow up with Marvine Rush, MD for primary care needs.   I spent  25  minutes in the care of the patient today including review of labs from Thyroid  Function, CMP, and other relevant labs ; imaging/biopsy records (current and previous including abstractions from other facilities); face-to-face time discussing  her lab results and symptoms, medications doses, her options of short and long term treatment based on the latest standards of care / guidelines;   and documenting the encounter.  Sophia Wilson  participated in the discussions, expressed understanding, and voiced agreement with the above plans.  All questions were answered to her satisfaction. she is encouraged to contact clinic should she have any questions or concerns prior to her return visit.   Follow up plan: Return in about 6 months (around 10/26/2024) for Fasting Labs  in AM B4 8, A1c -[place].  Ranny Earl, MD Phone: [phone]  Fax: [phone]  -  This note was partially dictated with voice recognition software. Similar sounding words can be transcribed inadequately or may not  be corrected upon review.  04/27/2024, 11:47 AM

## 2024-04-30 LAB — THYROGLOBULIN LEVEL: Thyroglobulin (TG-RIA): <2 ng/mL

## 2024-04-30 LAB — T4, FREE: Free T4: 2 ng/dL — ABNORMAL HIGH (ref 0.82–1.77)

## 2024-04-30 LAB — THYROGLOBULIN ANTIBODY: Thyroglobulin Antibody: <1 [IU]/mL (ref 0.0–0.9)

## 2024-04-30 LAB — TSH: TSH: 1.09 u[IU]/mL (ref 0.450–4.500)

## 2024-05-01 ENCOUNTER — Telehealth: Payer: Self-pay

## 2024-05-01 DIAGNOSIS — M545 Low back pain, unspecified: Secondary | ICD-10-CM | POA: Diagnosis not present

## 2024-05-04 ENCOUNTER — Other Ambulatory Visit: Payer: Self-pay

## 2024-05-04 NOTE — Patient Outreach (Signed)
 Complex Care Management   Visit Note  05/04/2024  Name:  Sophia Wilson MRN: [id number] DOB: 1955/12/03  Situation: Referral received for Complex Care Management related to Health Maintenance. I obtained verbal consent from Patient.  Visit completed with Sophia Wilson  on the phone  Background:   Past Medical History:  Diagnosis Date   Anemia    Cancer (HCC)    thyroid  cancer   Collagen vascular disease    Diabetes mellitus without complication (HCC)    Eczema    Fibroid tumor    GERD (gastroesophageal reflux disease)    H/O seasonal allergies    Herpes simplex without mention of complication    rt thigh   Hypertension    Thyroid  disease    cancer   Vitiligo     Assessment: Patient Reported Symptoms:  Cognitive Cognitive Status: Alert and oriented to person, place, and time, Insightful and able to interpret abstract concepts, Normal speech and language skills      Neurological Neurological Review of Symptoms: No symptoms reported    HEENT HEENT Symptoms Reported: No symptoms reported      Cardiovascular Cardiovascular Symptoms Reported: No symptoms reported    Respiratory Respiratory Symptoms Reported: No symptoms reported    Endocrine Endocrine Symptoms Reported: No symptoms reported Is patient diabetic?: Yes    Gastrointestinal Gastrointestinal Symptoms Reported: No symptoms reported      Genitourinary Genitourinary Symptoms Reported: No symptoms reported    Integumentary Integumentary Symptoms Reported: No symptoms reported    Musculoskeletal Musculoskelatal Symptoms Reviewed: No symptoms reported   Falls in the past year?: No    Psychosocial Psychosocial Symptoms Reported: No symptoms reported          05/04/2024    PHQ2-9 Depression Screening   Little interest or pleasure in doing things    Feeling down, depressed, or hopeless    PHQ-2 - Total Score    Trouble falling or staying asleep, or sleeping too much    Feeling tired or having little  energy    Poor appetite or overeating     Feeling bad about yourself - or that you are a failure or have let yourself or your family down    Trouble concentrating on things, such as reading the newspaper or watching television    Moving or speaking so slowly that other people could have noticed.  Or the opposite - being so fidgety or restless that you have been moving around a lot more than usual    Thoughts that you would be better off dead, or hurting yourself in some way    PHQ2-9 Total Score    If you checked off any problems, how difficult have these problems made it for you to do your work, take care of things at home, or get along with other people    Depression Interventions/Treatment      There were no vitals filed for this visit.  MEDICATIONS: Patient was able to pick up levothyroxine  and is taking as prescribed (dose change by Endo on 04/27/24). No other med concerns.   Recommendation:   Continue Current Plan of Care  Follow Up Plan:   Telephone follow-up in 1 month  Santana Stamp BSN, CCM [place]  [employer] Population Health RN Care Manager Direct Dial: [phone]  Fax: [phone]

## 2024-05-04 NOTE — Patient Instructions (Signed)
 Visit Information  Thank you for taking time to visit with me today. Please don't hesitate to contact me if I can be of assistance to you before our next scheduled appointment.  Your next care management appointment is by telephone on Monday, November 17th at 10:00am.   Please call the care guide team at [phone] if you need to cancel, schedule, or reschedule an appointment.   A reminder to ALL patients/family/friends, please call the [place]  National Suicide Prevention Lifeline: (425) 479-9242 or TTY: 909-231-7603 TTY (364)885-1321) to talk to a trained counselor if you are experiencing a Mental Health or Behavioral Health Crisis or need someone to talk to.  Santana Stamp BSN, CCM [place]  VBCI Population Health RN Care Manager Direct Dial: [phone]  Fax: [phone]

## 2024-05-05 DIAGNOSIS — M25562 Pain in left knee: Secondary | ICD-10-CM | POA: Diagnosis not present

## 2024-05-28 ENCOUNTER — Other Ambulatory Visit (HOSPITAL_COMMUNITY): Payer: Self-pay | Admitting: Family Medicine

## 2024-05-28 DIAGNOSIS — Z1231 Encounter for screening mammogram for malignant neoplasm of breast: Secondary | ICD-10-CM

## 2024-06-01 ENCOUNTER — Telehealth: Payer: Self-pay

## 2024-06-04 ENCOUNTER — Other Ambulatory Visit: Payer: Self-pay

## 2024-06-04 NOTE — Patient Outreach (Signed)
 Complex Care Management   Visit Note  06/04/2024  Name:  Sophia Wilson MRN: [id number] DOB: [birth year]  Situation: Referral received for Complex Care Management related to Back Pain, Care Gap assessment.  I obtained verbal consent from Patient.  Visit completed with Ms. Murdy  on the phone. Denies health concerns today, no needs.   Background:   Past Medical History:  Diagnosis Date   Anemia    Cancer (HCC)    thyroid  cancer   Collagen vascular disease    Diabetes mellitus without complication (HCC)    Eczema    Fibroid tumor    GERD (gastroesophageal reflux disease)    H/O seasonal allergies    Herpes simplex without mention of complication    rt thigh   Hypertension    Thyroid  disease    cancer   Vitiligo     Assessment: Patient Reported Symptoms:  Cognitive Cognitive Status: No symptoms reported, Alert and oriented to person, place, and time, Insightful and able to interpret abstract concepts, Normal speech and language skills      Neurological Neurological Review of Symptoms: No symptoms reported    HEENT HEENT Symptoms Reported: No symptoms reported      Cardiovascular Cardiovascular Symptoms Reported: No symptoms reported    Respiratory Respiratory Symptoms Reported: No symptoms reported    Endocrine Endocrine Symptoms Reported: No symptoms reported Is patient diabetic?: Yes Is patient checking blood sugars at home?: Yes Endocrine Comment: A1C 6.2% on 04/27/2024  Gastrointestinal Gastrointestinal Symptoms Reported: No symptoms reported      Genitourinary Genitourinary Symptoms Reported: No symptoms reported    Integumentary Integumentary Symptoms Reported: No symptoms reported    Musculoskeletal Musculoskelatal Symptoms Reviewed: Back pain Other Musculoskeletal Symptoms: Reports lower back pain of 3/10, she is going to [facility] for treatment, taking meloxicam, states pain is manageable at this time.   Falls in the past year?: No     Psychosocial Psychosocial Symptoms Reported: No symptoms reported          06/04/2024    PHQ2-9 Depression Screening   Little interest or pleasure in doing things Not at all  Feeling down, depressed, or hopeless Not at all  PHQ-2 - Total Score 0  Trouble falling or staying asleep, or sleeping too much    Feeling tired or having little energy    Poor appetite or overeating     Feeling bad about yourself - or that you are a failure or have let yourself or your family down    Trouble concentrating on things, such as reading the newspaper or watching television    Moving or speaking so slowly that other people could have noticed.  Or the opposite - being so fidgety or restless that you have been moving around a lot more than usual    Thoughts that you would be better off dead, or hurting yourself in some way    PHQ2-9 Total Score    If you checked off any problems, how difficult have these problems made it for you to do your work, take care of things at home, or get along with other people    Depression Interventions/Treatment      There were no vitals filed for this visit. Pain Scale: 0-10 Pain Score: 3  Pain Type: Chronic pain Pain Location: Back Pain Orientation: Lower  MEDICATIONS: No concerns today, no problems with getting refills.    Recommendation:   Continue Current Plan of Care  Follow Up Plan:   Closing From:  Complex Care Management Patient has met all care management goals. Care Management case will be closed. Patient has been provided contact information should new needs arise.   Santana Stamp BSN, CCM [place]  VBC[employer] Population Health RN Care Manager Direct Dial: [phone]  Fax: [phone]

## 2024-06-04 NOTE — Patient Instructions (Signed)
 Visit Information  Thank you for taking time to visit with me today. Please don't hesitate to contact me if I can be of assistance to you before our next scheduled appointment.  Your next care management appointment is no further scheduled appointments.   Patient has met all care management goals. Care Management case will be closed. Patient has been provided contact information should new needs arise.   Please call the care guide team at [phone] if you need to cancel, schedule, or reschedule an appointment.   Please call the [place]  National Suicide Prevention Lifeline: 650 389 3178 or TTY: 734-881-6531 TTY 6088453451) to talk to a trained counselor if you are experiencing a Mental Health or Behavioral Health Crisis or need someone to talk to.  Santana Stamp BSN, CCM [place]  VBCI Population Health RN Care Manager Direct Dial: [phone]  Fax: [phone]

## 2024-06-08 DIAGNOSIS — M4807 Spinal stenosis, lumbosacral region: Secondary | ICD-10-CM | POA: Diagnosis not present

## 2024-07-03 ENCOUNTER — Ambulatory Visit (HOSPITAL_COMMUNITY)
Admission: RE | Admit: 2024-07-03 | Discharge: 2024-07-03 | Disposition: A | Discharge status: Home / Self Care | Source: Ambulatory Visit | Attending: Family Medicine | Admitting: Family Medicine

## 2024-07-03 DIAGNOSIS — Z1231 Encounter for screening mammogram for malignant neoplasm of breast: Secondary | ICD-10-CM | POA: Diagnosis not present

## 2024-08-07 ENCOUNTER — Other Ambulatory Visit (HOSPITAL_BASED_OUTPATIENT_CLINIC_OR_DEPARTMENT_OTHER): Payer: Self-pay | Admitting: Family Medicine

## 2024-08-07 ENCOUNTER — Ambulatory Visit (HOSPITAL_COMMUNITY)
Admission: RE | Admit: 2024-08-07 | Discharge: 2024-08-07 | Disposition: A | Source: Ambulatory Visit | Attending: Family Medicine | Admitting: Family Medicine

## 2024-08-07 DIAGNOSIS — R7989 Other specified abnormal findings of blood chemistry: Secondary | ICD-10-CM | POA: Insufficient documentation

## 2024-08-07 DIAGNOSIS — M7989 Other specified soft tissue disorders: Secondary | ICD-10-CM | POA: Insufficient documentation

## 2024-08-12 ENCOUNTER — Other Ambulatory Visit: Payer: Self-pay

## 2024-08-12 ENCOUNTER — Encounter (HOSPITAL_COMMUNITY): Payer: Self-pay

## 2024-08-12 ENCOUNTER — Emergency Department (HOSPITAL_COMMUNITY)
Admission: EM | Admit: 2024-08-12 | Discharge: 2024-08-12 | Disposition: A | Discharge status: Home / Self Care | Attending: Emergency Medicine | Admitting: Emergency Medicine

## 2024-08-12 ENCOUNTER — Emergency Department (HOSPITAL_COMMUNITY)

## 2024-08-12 DIAGNOSIS — W1830XA Fall on same level, unspecified, initial encounter: Secondary | ICD-10-CM | POA: Insufficient documentation

## 2024-08-12 DIAGNOSIS — Z7901 Long term (current) use of anticoagulants: Secondary | ICD-10-CM | POA: Diagnosis not present

## 2024-08-12 DIAGNOSIS — E119 Type 2 diabetes mellitus without complications: Secondary | ICD-10-CM | POA: Insufficient documentation

## 2024-08-12 DIAGNOSIS — R55 Syncope and collapse: Secondary | ICD-10-CM | POA: Diagnosis not present

## 2024-08-12 DIAGNOSIS — S92322A Displaced fracture of second metatarsal bone, left foot, initial encounter for closed fracture: Secondary | ICD-10-CM | POA: Diagnosis not present

## 2024-08-12 DIAGNOSIS — I10 Essential (primary) hypertension: Secondary | ICD-10-CM | POA: Insufficient documentation

## 2024-08-12 DIAGNOSIS — Z7982 Long term (current) use of aspirin: Secondary | ICD-10-CM | POA: Insufficient documentation

## 2024-08-12 DIAGNOSIS — R600 Localized edema: Secondary | ICD-10-CM | POA: Insufficient documentation

## 2024-08-12 DIAGNOSIS — S92332A Displaced fracture of third metatarsal bone, left foot, initial encounter for closed fracture: Secondary | ICD-10-CM | POA: Insufficient documentation

## 2024-08-12 DIAGNOSIS — Z79899 Other long term (current) drug therapy: Secondary | ICD-10-CM | POA: Diagnosis not present

## 2024-08-12 DIAGNOSIS — S92342A Displaced fracture of fourth metatarsal bone, left foot, initial encounter for closed fracture: Secondary | ICD-10-CM | POA: Insufficient documentation

## 2024-08-12 DIAGNOSIS — Z85850 Personal history of malignant neoplasm of thyroid: Secondary | ICD-10-CM | POA: Insufficient documentation

## 2024-08-12 DIAGNOSIS — S92309A Fracture of unspecified metatarsal bone(s), unspecified foot, initial encounter for closed fracture: Secondary | ICD-10-CM

## 2024-08-12 DIAGNOSIS — Z7984 Long term (current) use of oral hypoglycemic drugs: Secondary | ICD-10-CM | POA: Diagnosis not present

## 2024-08-12 DIAGNOSIS — S99922A Unspecified injury of left foot, initial encounter: Secondary | ICD-10-CM | POA: Diagnosis present

## 2024-08-12 LAB — BASIC METABOLIC PANEL WITH GFR
Anion gap: 14 (ref 5–15)
BUN: 47 mg/dL — ABNORMAL HIGH (ref 8–23)
CO2: 28 mmol/L (ref 22–32)
Calcium: 10.1 mg/dL (ref 8.9–10.3)
Chloride: 98 mmol/L (ref 98–111)
Creatinine, Ser: 1.21 mg/dL — ABNORMAL HIGH (ref 0.44–1.00)
GFR, Estimated: 49 mL/min — ABNORMAL LOW
Glucose, Bld: 91 mg/dL (ref 70–99)
Potassium: 4.3 mmol/L (ref 3.5–5.1)
Sodium: 141 mmol/L (ref 135–145)

## 2024-08-12 LAB — URINALYSIS, ROUTINE W REFLEX MICROSCOPIC
Bilirubin Urine: NEGATIVE
Glucose, UA: NEGATIVE mg/dL
Hgb urine dipstick: NEGATIVE
Ketones, ur: NEGATIVE mg/dL
Leukocytes,Ua: NEGATIVE
Nitrite: NEGATIVE
Protein, ur: NEGATIVE mg/dL
Specific Gravity, Urine: 1.013 (ref 1.005–1.030)
pH: 6 (ref 5.0–8.0)

## 2024-08-12 LAB — CBC WITH DIFFERENTIAL/PLATELET
Abs Immature Granulocytes: 0.01 10*3/uL (ref 0.00–0.07)
Basophils Absolute: 0.1 10*3/uL (ref 0.0–0.1)
Basophils Relative: 1 %
Eosinophils Absolute: 0.1 10*3/uL (ref 0.0–0.5)
Eosinophils Relative: 1 %
HCT: 36.5 % (ref 36.0–46.0)
Hemoglobin: 11.7 g/dL — ABNORMAL LOW (ref 12.0–15.0)
Immature Granulocytes: 0 %
Lymphocytes Relative: 36 %
Lymphs Abs: 2.2 10*3/uL (ref 0.7–4.0)
MCH: 25.4 pg — ABNORMAL LOW (ref 26.0–34.0)
MCHC: 32.1 g/dL (ref 30.0–36.0)
MCV: 79.3 fL — ABNORMAL LOW (ref 80.0–100.0)
Monocytes Absolute: 0.6 10*3/uL (ref 0.1–1.0)
Monocytes Relative: 10 %
Neutro Abs: 3.2 10*3/uL (ref 1.7–7.7)
Neutrophils Relative %: 52 %
Platelets: 350 10*3/uL (ref 150–400)
RBC: 4.6 MIL/uL (ref 3.87–5.11)
RDW: 14.9 % (ref 11.5–15.5)
WBC: 6.1 10*3/uL (ref 4.0–10.5)
nRBC: 0 % (ref 0.0–0.2)

## 2024-08-12 NOTE — ED Notes (Signed)
 CAM boot applied to L foot/ankle, pt was able to ambulate with her CAM boot and cane several feet--PA-C made aware

## 2024-08-12 NOTE — Discharge Instructions (Signed)
 Stop your furosemide and aspirin.  You need to continue the Eliquis, take as directed.  Use the cam boot when standing or walking.  Move slowly when standing from a seated or lying position.  Drink plenty of water  for the next few days.  You will need to have your kidney functions rechecked in 1 week.  Your primary care provider can arrange this for you.  Please call Dr. Bertha office to arrange follow-up appointment.  Turn to the emergency department for any new or worsening symptoms.  Also call Dr. Areatha office to arrange follow-up appointment regarding the broken bones of your foot.

## 2024-08-12 NOTE — ED Triage Notes (Signed)
 Pt reports her knee has been sore due to a blood clot and she is taking eliquis.  Pt reports her leg gave out and she fell yesterday and now her foot is sore as well.

## 2024-08-26 ENCOUNTER — Ambulatory Visit: Admitting: Orthopedic Surgery

## 2024-10-26 ENCOUNTER — Ambulatory Visit: Admitting: "Endocrinology
